# Patient Record
Sex: Female | Born: 1955
Health system: Southern US, Community
[De-identification: ages and names within clinical notes are randomized; demographics above are authoritative.]

## PROBLEM LIST (undated history)

## (undated) DIAGNOSIS — M1990 Unspecified osteoarthritis, unspecified site: Secondary | ICD-10-CM

## (undated) DIAGNOSIS — I10 Essential (primary) hypertension: Secondary | ICD-10-CM

## (undated) DIAGNOSIS — IMO0001 Reserved for inherently not codable concepts without codable children: Secondary | ICD-10-CM

## (undated) DIAGNOSIS — K219 Gastro-esophageal reflux disease without esophagitis: Secondary | ICD-10-CM

## (undated) DIAGNOSIS — I776 Arteritis, unspecified: Secondary | ICD-10-CM

## (undated) HISTORY — PX: TUBAL LIGATION: SHX77

## (undated) HISTORY — PX: COLONOSCOPY: SHX174

---

## 2002-05-20 ENCOUNTER — Other Ambulatory Visit: Admission: RE | Admit: 2002-05-20 | Discharge: 2002-05-20 | Payer: Self-pay | Admitting: Obstetrics and Gynecology

## 2003-04-24 ENCOUNTER — Emergency Department (HOSPITAL_COMMUNITY): Admission: EM | Admit: 2003-04-24 | Discharge: 2003-04-24 | Payer: Self-pay | Admitting: Emergency Medicine

## 2003-11-13 ENCOUNTER — Emergency Department (HOSPITAL_COMMUNITY): Admission: EM | Admit: 2003-11-13 | Discharge: 2003-11-13 | Payer: Self-pay | Admitting: Emergency Medicine

## 2003-12-08 ENCOUNTER — Ambulatory Visit (HOSPITAL_COMMUNITY): Admission: RE | Admit: 2003-12-08 | Discharge: 2003-12-08 | Payer: Self-pay | Admitting: Internal Medicine

## 2005-04-05 ENCOUNTER — Inpatient Hospital Stay (HOSPITAL_COMMUNITY): Admission: RE | Admit: 2005-04-05 | Discharge: 2005-04-06 | Payer: Self-pay | Admitting: Obstetrics & Gynecology

## 2007-11-17 ENCOUNTER — Emergency Department (HOSPITAL_COMMUNITY): Admission: EM | Admit: 2007-11-17 | Discharge: 2007-11-17 | Payer: Self-pay | Admitting: Emergency Medicine

## 2008-05-15 ENCOUNTER — Emergency Department (HOSPITAL_COMMUNITY): Admission: EM | Admit: 2008-05-15 | Discharge: 2008-05-15 | Payer: Self-pay | Admitting: Emergency Medicine

## 2010-02-01 ENCOUNTER — Emergency Department (HOSPITAL_COMMUNITY): Admission: EM | Admit: 2010-02-01 | Discharge: 2010-02-01 | Payer: Self-pay | Admitting: Emergency Medicine

## 2010-05-22 ENCOUNTER — Emergency Department (HOSPITAL_COMMUNITY): Admission: EM | Admit: 2010-05-22 | Discharge: 2010-05-22 | Payer: Self-pay | Admitting: Emergency Medicine

## 2010-11-01 LAB — COMPREHENSIVE METABOLIC PANEL
AST: 31 U/L (ref 0–37)
Albumin: 3.8 g/dL (ref 3.5–5.2)
Calcium: 9.2 mg/dL (ref 8.4–10.5)
Creatinine, Ser: 1.11 mg/dL (ref 0.4–1.2)
GFR calc Af Amer: >60 mL/min (ref >60)

## 2010-11-01 LAB — CBC
MCH: 30.7 pg (ref 26.0–34.0)
MCHC: 34 g/dL (ref 30.0–36.0)
Platelets: 223 10*3/uL (ref 150–400)
RBC: 4.17 MIL/uL (ref 3.87–5.11)

## 2010-11-01 LAB — DIFFERENTIAL
Eosinophils Relative: 1 % (ref 0–5)
Lymphocytes Relative: 36 % (ref 12–46)
Lymphs Abs: 3.4 10*3/uL (ref 0.7–4.0)
Monocytes Absolute: 0.8 10*3/uL (ref 0.1–1.0)
Monocytes Relative: 8 % (ref 3–12)

## 2011-01-05 NOTE — Op Note (Signed)
Jane, [name] NO.:  [id number]   MEDICAL RECORD NO.:  [id number]          PATIENT TYPE:  INP   LOCATION:  9313                          FACILITY:  [facility]   PHYSICIAN:  Roseanna Rainbow, M.D.DATE OF BIRTH:  [birth year]   DATE OF PROCEDURE:  04/05/2005  DATE OF DISCHARGE:                                 OPERATIVE REPORT   PREOPERATIVE DIAGNOSIS:  Perineal body relaxation.   POSTOPERATIVE DIAGNOSES:  1.  Perineal body relaxation.  2.  Low rectocele.   PROCEDURE:  Posterior colporrhaphy, perineoplasty.   SURGEONS:  Roseanna Rainbow, M.D., Bing Neighbors. Clearance Coots, M.D.   ANESTHESIA:  General endotracheal.   COMPLICATIONS:  None.   ESTIMATED BLOOD LOSS:  Minimal.   PROCEDURES:  The patient was taken to the operating room with an IV running.  The patient was placed in the dorsal lithotomy position, prepped and draped  in the usual sterile fashion.  Allis clamps were applied to the posterior  vaginal mucosa.  An additional Allis clamp was placed in the midline at the  top of the rectocele.  A transverse incision was made in the posterior  fourchette.  The posterior vaginal cuff was then dissected off the  perirectal fascia.  An additional incision was made in the perineal body,  removing a triangular piece of skin.  A vertical skin was made in the  posterior vaginal mucosa.  The pararectal fascia was dissected off the  posterior vaginal mucosa.  The Denonvilliers' fascia was then plicated using  vertical mattress sutures.  The perineal body was then closed with  interrupted sutures and sutures were placed to reconstruct the perineal  body.  The posterior vaginal wall was then closed in a running fashion.  The  hymenal ring was reconstructed.  The subcutaneous tissue was reapproximated.  The perineal skin was reapproximated in a subcuticular fashion.  The vagina  was then irrigated, as well as the perineum.  A vaginal pack was then placed  as well as a  Foley catheter.      Roseanna Rainbow, M.D.  Electronically Signed     LAJ/MEDQ  D:  04/05/2005  T:  04/05/2005  Job:  [id number]

## 2011-01-05 NOTE — Discharge Summary (Signed)
NAMELENELL, MCCONNELL NO.:  [id number]   MEDICAL RECORD NO.:  [id number]          PATIENT TYPE:  INP   LOCATION:  9313                          FACILITY:  [facility]   PHYSICIAN:  Roseanna Rainbow, M.D.DATE OF BIRTH:  04/04/1956   DATE OF ADMISSION:  04/05/2005  DATE OF DISCHARGE:  04/06/2005                                 DISCHARGE SUMMARY   CHIEF COMPLAINT:  The patient is a 55 year old para 4 with perineal  relaxation who presents for perineorrhaphy, possible posterior colporrhaphy.   ALLERGIES:  No known drug allergies.   MEDICATIONS:  Lisinopril, hydrochlorothiazide, Prevacid, lorazepam.   PAST MEDICAL HISTORY:  1.  GERD.  2.  Hypertension.  3.  Anxiety disorder.   PAST SURGICAL HISTORY:  Bilateral tubal ligation.   PAST OB/GYN HISTORY:  She is status post four spontaneous vaginal  deliveries.  Please see the above.   SOCIAL HISTORY:  No tobacco, ethanol, or drug use.   FAMILY HISTORY:  Uterine cancer and diabetes mellitus.   PHYSICAL EXAMINATION:  VITAL SIGNS:  Temperature 97.8, pulse 78,  respirations 20, blood pressure 166/83.  GENERAL APPEARANCE:  No apparent distress.  HEENT:  Normocephalic, atraumatic.  NECK:  Supple.  LUNGS:  Clear to auscultation bilaterally.  HEART:  Regular rate and rhythm.  ABDOMEN:  No organomegaly.  PELVIC:  External female genitalia.  Remarkable for lax perineal body.  Vagina:  No rectocele or cystocele.  Cervix without lesions.  Bimanual  examination:  The uterus is small, anteverted, nontender, well supported.  Adnexa nonpalpable, nontender.  Rectovaginal examination confirmatory.  EXTREMITIES:  No clubbing, cyanosis, edema.  SKIN:  Without rash.   ASSESSMENT:  Perineal body relaxation.   PLAN:  Perineorrhaphy, possible posterior colporrhaphy.   HOSPITAL COURSE:  The patient was admitted and underwent posterior  colporrhaphy and perineorrhaphy.  Please see the dictated operative summary  for further details.   Postoperatively she did well.  She was discharged to  home on postoperative day #1, tolerating a regular diet.   DISCHARGE DIAGNOSES:  Perineal body relaxation, low rectocele.   PROCEDURE:  Posterior colporrhaphy and perineorrhaphy.   CONDITION ON DISCHARGE:  Stable.   DIET:  Regular.   ACTIVITY:  Increase activity slowly.  Pelvic rest.  Peri-Care and sitz  baths.   MEDICATIONS:  Percocet one to two tablets every six hours as needed.  May  take an over-the-counter stool softener.   DISPOSITION:  The patient was to follow up in the office in two weeks.      Roseanna Rainbow, M.D.  Electronically Signed     LAJ/MEDQ  D:  05/07/2005  T:  05/07/2005  Job:  [id number]

## 2011-05-21 LAB — COMPREHENSIVE METABOLIC PANEL
ALT: 23
AST: 30
Albumin: 3.8
CO2: 28
Calcium: 9
GFR calc Af Amer: >60
GFR calc non Af Amer: >60
Sodium: 139
Total Protein: 7.2

## 2011-05-21 LAB — DIFFERENTIAL
Eosinophils Absolute: 0.1
Eosinophils Relative: 1
Lymphs Abs: 1.8
Monocytes Absolute: 0.5
Monocytes Relative: 9

## 2011-05-21 LAB — URINALYSIS, ROUTINE W REFLEX MICROSCOPIC
Bilirubin Urine: NEGATIVE
Hgb urine dipstick: NEGATIVE
Nitrite: NEGATIVE
Specific Gravity, Urine: 1.016
pH: 8

## 2011-05-21 LAB — URINE MICROSCOPIC-ADD ON

## 2011-05-21 LAB — CBC
MCHC: 33.3
RBC: 4.28
WBC: 5.6

## 2011-07-24 ENCOUNTER — Ambulatory Visit: Attending: Obstetrics and Gynecology | Admitting: Physical Therapy

## 2011-07-24 DIAGNOSIS — M629 Disorder of muscle, unspecified: Secondary | ICD-10-CM | POA: Insufficient documentation

## 2011-07-24 DIAGNOSIS — M2420 Disorder of ligament, unspecified site: Secondary | ICD-10-CM | POA: Insufficient documentation

## 2011-07-24 DIAGNOSIS — IMO0001 Reserved for inherently not codable concepts without codable children: Secondary | ICD-10-CM | POA: Insufficient documentation

## 2011-08-23 ENCOUNTER — Encounter: Admitting: Physical Therapy

## 2011-09-07 ENCOUNTER — Ambulatory Visit: Admitting: Physical Therapy

## 2011-10-27 ENCOUNTER — Emergency Department (INDEPENDENT_AMBULATORY_CARE_PROVIDER_SITE_OTHER)
Admission: EM | Admit: 2011-10-27 | Discharge: 2011-10-27 | Disposition: A | Discharge status: Home / Self Care | Source: Home / Self Care

## 2011-10-27 ENCOUNTER — Encounter (HOSPITAL_COMMUNITY): Payer: Self-pay

## 2011-10-27 DIAGNOSIS — I10 Essential (primary) hypertension: Secondary | ICD-10-CM

## 2011-10-27 DIAGNOSIS — R51 Headache: Secondary | ICD-10-CM

## 2011-10-27 HISTORY — DX: Essential (primary) hypertension: I10

## 2011-10-27 MED ORDER — IBUPROFEN 600 MG PO TABS: 600.0000 mg | ORAL_TABLET | Freq: Three times a day (TID) | ORAL | Status: AC | PRN

## 2011-10-27 NOTE — ED Notes (Signed)
Pt has headache and sinus pressure since Monday and saw her MD on Wed and was told it was allergies and had allergy testing done on Thursday and has the results, but continues to have the pressure in her eyes and head.

## 2011-10-27 NOTE — Discharge Instructions (Signed)
Call Dr Mathews Robinsons office Monday for a follow up appt. If your headache changes or worsens before then, go to the ER for further evaluation.  Take your blood pressure medication as prescribed.

## 2011-10-27 NOTE — ED Provider Notes (Signed)
History     CSN: [id number]  Arrival date & time 10/27/11  0954   None     Chief Complaint  Patient presents with  . Headache    (Consider location/radiation/quality/duration/timing/severity/associated sxs/prior treatment) HPI Comments: Jane [name] is a 56 year old female with history of hypertension who presents today with complaints of mild intermittent headache for the last 4 days. Headache is in the frontal area and top of her head. It is tender to the touch. She was seen by her primary care physician the day of onset of symptoms and the following day. She states that she received injections for her symptoms both days, had allergy testing done and blood work. She states the office called yesterday and told her that her CBC, liver function, kidney tests, and thyroid tests were all negative. She states she has had some fatigue. Has some mild nasal congestion but no rhinorrhea. Only notices a small amount of postnasal drainage in the mornings when she first awakens. She denies cough, fever or chills. Her blood pressure is typically well-controlled. Her blood pressure was noted to be mildly elevated today but she states she did not take her blood pressure medication toda. She admits that she has had mild intermittent headaches in the past but usually associated with GERD flareups and resolves with taking over-the-counter heartburn medication. She has not tried any over-the-counter pain relievers for her headache.   Past Medical History  Diagnosis Date  . Hypertension     History reviewed. No pertinent past surgical history.  History reviewed. No pertinent family history.  History  Substance Use Topics  . Smoking status: Never Smoker   . Smokeless tobacco: Not on file  . Alcohol Use: No    OB History    Grav Para Term Preterm Abortions TAB SAB Ect Mult Living                  Review of Systems  Constitutional: Positive for fatigue. Negative for fever, chills and appetite change.    HENT: Positive for congestion and postnasal drip. Negative for ear pain, sore throat, rhinorrhea, sneezing and sinus pressure.   Respiratory: Negative for cough, shortness of breath and wheezing.   Cardiovascular: Negative for chest pain and palpitations.  Gastrointestinal: Negative for nausea and vomiting.    Allergies  Review of patient's allergies indicates no known allergies.  Home Medications   Current Outpatient Rx  Name Route Sig Dispense Refill  . BENAZEPRIL-HYDROCHLOROTHIAZIDE 20-12.5 MG PO TABS Oral Take 1 tablet by mouth daily.    . IBUPROFEN 600 MG PO TABS Oral Take 1 tablet (600 mg total) by mouth every 8 (eight) hours as needed for pain. 10 tablet 0    BP 150/85  Pulse 65  Temp(Src) 98 F (36.7 C) (Oral)  Resp 18  SpO2 100%  Physical Exam  Nursing note and vitals reviewed. Constitutional: She appears well-developed and well-nourished. No distress.  HENT:  Head: Normocephalic and atraumatic.  Right Ear: Tympanic membrane, external ear and ear canal normal.  Left Ear: Tympanic membrane, external ear and ear canal normal.  Nose: Nose normal.  Mouth/Throat: Uvula is midline, oropharynx is clear and moist and mucous membranes are normal. No oropharyngeal exudate, posterior oropharyngeal edema or posterior oropharyngeal erythema.  Eyes: Conjunctivae, EOM and lids are normal. Pupils are equal, round, and reactive to light.  Fundoscopic exam:      The right eye shows no hemorrhage and no papilledema.       The left eye shows no  hemorrhage and no papilledema.  Neck: Normal range of motion. Neck supple. No muscular tenderness present.  Cardiovascular: Normal rate, regular rhythm and normal heart sounds.   Pulmonary/Chest: Effort normal and breath sounds normal. No respiratory distress.  Lymphadenopathy:    She has no cervical adenopathy.  Neurological: She is alert. She has normal strength. No cranial nerve deficit. She displays a negative Romberg sign. Gait normal.   Reflex Scores:      Bicep reflexes are 2+ on the right side and 2+ on the left side. Skin: Skin is warm and dry.  Psychiatric: She has a normal mood and affect.    ED Course  Procedures (including critical care time)  Labs Reviewed - No data to display No results found.   1. Headache   2. Hypertension       MDM   Mild intermittent HA x 4 days. Has been seen twice by PCP with neg labs reported by pt. Exam neg. Pt has not tried any pain relievers. Rx Ibuprofen 600 mg & to f/u with PCP Monday if symptoms persist.        Melody Comas, PA 10/27/11 1354

## 2011-10-28 NOTE — ED Provider Notes (Signed)
Medical screening examination/treatment/procedure(s) were performed by non-physician practitioner and as supervising physician I was immediately available for consultation/collaboration.  Leslee Home, M.D.   Reuben Likes, MD 10/28/11 [phone]

## 2011-11-16 ENCOUNTER — Other Ambulatory Visit: Payer: Self-pay

## 2011-11-16 ENCOUNTER — Encounter (HOSPITAL_COMMUNITY): Payer: Self-pay

## 2011-11-16 ENCOUNTER — Emergency Department (INDEPENDENT_AMBULATORY_CARE_PROVIDER_SITE_OTHER)
Admission: EM | Admit: 2011-11-16 | Discharge: 2011-11-16 | Disposition: A | Discharge status: Home Health | Source: Home / Self Care | Attending: Emergency Medicine | Admitting: Emergency Medicine

## 2011-11-16 DIAGNOSIS — K219 Gastro-esophageal reflux disease without esophagitis: Secondary | ICD-10-CM

## 2011-11-16 DIAGNOSIS — R002 Palpitations: Secondary | ICD-10-CM

## 2011-11-16 MED ORDER — DEXLANSOPRAZOLE 30 MG PO CPDR: 30.0000 mg | DELAYED_RELEASE_CAPSULE | Freq: Every day | ORAL | Status: DC

## 2011-11-16 NOTE — Discharge Instructions (Signed)
Diet for GERD or PUD Nutrition therapy can help ease the discomfort of gastroesophageal reflux disease (GERD) and peptic ulcer disease (PUD).  HOME CARE INSTRUCTIONS   Eat your meals slowly, in a relaxed setting.   Eat 5 to 6 small meals per day.   If a food causes distress, stop eating it for a period of time.  FOODS TO AVOID  Coffee, regular or decaffeinated.   Cola beverages, regular or low calorie.   Tea, regular or decaffeinated.   Pepper.   Cocoa.   High fat foods, including meats.   Butter, margarine, hydrogenated oil (trans fats).   Peppermint or spearmint (if you have GERD).   Fruits and vegetables if not tolerated.   Alcohol.   Nicotine (smoking or chewing). This is one of the most potent stimulants to acid production in the gastrointestinal tract.   Any food that seems to aggravate your condition.  If you have questions regarding your diet, ask your caregiver or a registered dietitian. TIPS  Lying flat may make symptoms worse. Keep the head of your bed raised 6 to 9 inches (15 to 23 cm) by using a foam wedge or blocks under the legs of the bed.   Do not lay down until 3 hours after eating a meal.   Daily physical activity may help reduce symptoms.  MAKE SURE YOU:   Understand these instructions.   Will watch your condition.   Will get help right away if you are not doing well or get worse.  Document Released: 08/06/2005 Document Revised: 07/26/2011 Document Reviewed: 06/22/2011 [employer] Patient Information 2012 [employer]. 

## 2011-11-16 NOTE — ED Provider Notes (Signed)
Chief Complaint  Patient presents with  . Abdominal Pain    History of Present Illness:   Jane [name] is a 56 year old female who presents today for 2 issues: Indigestion, and palpitations.  She has had indigestion and heartburn for years. This has been diagnosed as reflux esophagitis. She describes heartburn, indigestion, water brash, burning in the chest, burning in the epigastrium, abdominal bloating, nausea, and some dysphagia. She denies any vomiting or evidence of GI bleeding. She had seen Dr. Loreta Ave for this in the past although no endoscopy was done. She was given DEXILANT and and this seemed to relieve the symptoms but did cause some headache. She had stopped it a couple weeks ago and that's when her symptoms flared back up again.  She also describes a one-week history of heart palpitations. She describes these as "an extra heartbeat." These are intermittent and last just a second at a time. She denies any associated chest pain, tightness, pressure, dizziness, syncope, or shortness of breath. She has had some headaches and has felt somewhat weak. She denies any cardiac history. Does not use caffeine to excess.  Review of Systems:  Other than noted above, the patient denies any of the following symptoms. Systemic:  No fever, chills, sweats, fatigue, myalgias, headache, or anorexia. Eye:  No redness, pain or drainage. ENT:  No earache, nasal congestion, rhinorrhea, sinus pressure, or sore throat. Lungs:  No cough, sputum production, wheezing, shortness of breath.  Cardiovascular:  No chest pain, palpitations, or syncope. GI:  No nausea, vomiting, abdominal pain or diarrhea. GU:  No dysuria, frequency, or hematuria. Skin:  No rash or pruritis.  PMFSH:  Past medical history, family history, social history, meds, and allergies were reviewed.  Physical Exam:   Vital signs:  BP 136/80  Pulse 69  Temp(Src) 98 F (36.7 C) (Oral)  Resp 14  SpO2 100% General:  Alert, in no distress. Eye:  PERRL,  full EOMs.  Lids and conjunctivas were normal. ENT:  TMs and canals were normal, without erythema or inflammation.  Nasal mucosa was clear and uncongested, without drainage.  Mucous membranes were moist.  Pharynx was clear, without exudate or drainage.  There were no oral ulcerations or lesions. Neck:  Supple, no adenopathy, tenderness or mass. Thyroid was normal. Lungs:  No respiratory distress.  Lungs were clear to auscultation, without wheezes, rales or rhonchi.  Breath sounds were clear and equal bilaterally. Heart:  Regular rhythm, without gallops, murmers or rubs. Abdomen:  Soft, flat, and non-tender to palpation.  No hepatosplenomagaly or mass. Skin:  Clear, warm, and dry, without rash or lesions.   Date: 11/16/2011  Rate: 62  Rhythm: normal sinus rhythm  QRS Axis: normal  Intervals: normal  ST/T Wave abnormalities: normal  Conduction Disutrbances:none  Narrative Interpretation: Normal sinus rhythm, normal EKG.  Old EKG Reviewed: unchanged  Medical Decision Making:  It sounds like she has long-standing reflux, but she's never had an endoscopy. I suggested that she followup with Dr. Loreta Ave and she may want to consider having an endoscopy done. It seems like the Dexilant was working but was causing some headaches, so maybe worthwhile trying a lesser dose of DEXILANT.  The palpitations sound like PACs or PVCs. I don't think they're anything serious, given her normal EKG and don't think she needs any followup, unless they should become bothersome or worrisome to her.   Assessment:   Diagnoses that have been ruled out:  None  Diagnoses that are still under consideration:  None  Final  diagnoses:  GERD (gastroesophageal reflux disease)  Palpitations    Plan:   1.  The following meds were prescribed:   New Prescriptions   DEXLANSOPRAZOLE (DEXILANT) 30 MG CAPSULE    Take 1 capsule (30 mg total) by mouth daily.   2.  The patient was instructed in symptomatic care and handouts were  given. 3.  The patient was told to return if becoming worse in any way, if no better in 3 or 4 days, and given some red flag symptoms that would indicate earlier return.    Reuben Likes, MD 11/16/11 2138

## 2011-11-16 NOTE — ED Notes (Signed)
Pt c/o abdominal pain onset for past couple weeks. Pt using OTC meds with no relief.  Pt states pain is epigastric, she feels bloated.  Pt denies N/V/D.

## 2012-09-17 ENCOUNTER — Other Ambulatory Visit: Payer: Self-pay

## 2012-09-17 ENCOUNTER — Encounter (HOSPITAL_COMMUNITY): Payer: Self-pay | Admitting: Emergency Medicine

## 2012-09-17 ENCOUNTER — Emergency Department (INDEPENDENT_AMBULATORY_CARE_PROVIDER_SITE_OTHER)

## 2012-09-17 ENCOUNTER — Emergency Department (INDEPENDENT_AMBULATORY_CARE_PROVIDER_SITE_OTHER)
Admission: EM | Admit: 2012-09-17 | Discharge: 2012-09-17 | Disposition: A | Discharge status: Home / Self Care | Source: Home / Self Care

## 2012-09-17 DIAGNOSIS — R0609 Other forms of dyspnea: Secondary | ICD-10-CM

## 2012-09-17 DIAGNOSIS — R0989 Other specified symptoms and signs involving the circulatory and respiratory systems: Secondary | ICD-10-CM

## 2012-09-17 DIAGNOSIS — K219 Gastro-esophageal reflux disease without esophagitis: Secondary | ICD-10-CM

## 2012-09-17 HISTORY — DX: Gastro-esophageal reflux disease without esophagitis: K21.9

## 2012-09-17 HISTORY — DX: Reserved for inherently not codable concepts without codable children: IMO0001

## 2012-09-17 NOTE — ED Provider Notes (Signed)
Medical screening examination/treatment/procedure(s) were performed by non-physician practitioner and as supervising physician I was immediately available for consultation/collaboration.  Fredrick Geoghegan   Valary Manahan, MD 09/17/12 1540 

## 2012-09-17 NOTE — ED Notes (Signed)
Patient returned from xray.

## 2012-09-17 NOTE — ED Notes (Signed)
Reports "dont feel good" chest soreness.  Generalized not feeling her usual self, just feeling bad in general.

## 2012-09-17 NOTE — ED Provider Notes (Signed)
History     CSN: [id number]  Arrival date & time 09/17/12  1247   None     Chief Complaint  Patient presents with  . Weakness    (Consider location/radiation/quality/duration/timing/severity/associated sxs/prior treatment) HPI Comments: 57 year old female who presents with epigastric discomfort, nausea, heartburn, belching and bloating. She has a history of GERD and has been managed with Dexalant. She did stop taking it for a while because it was "too strong" due to side effects. She also has Prevacid and she is just restarted that. She states the symptoms are similar to what she has had in the past relating to GERD. She denies having shortness of breath at rest or any form of chest discomfort. 3 days ago while carrying groceries up the stairs she became short of breath. Since that time exertion such as housework causes her to be short of breath and fatigue. No chest pain or discomfort.  While sitting on the table she appears quite comfortable, talkative and jovial. No evidence of respiratory distress and no chest discomfort.   Past Medical History  Diagnosis Date  . Hypertension   . Reflux     Past Surgical History  Procedure Date  . Tubal ligation   . Colonoscopy     No family history on file.  History  Substance Use Topics  . Smoking status: Never Smoker   . Smokeless tobacco: Not on file  . Alcohol Use: No    OB History    Grav Para Term Preterm Abortions TAB SAB Ect Mult Living                  Review of Systems  Constitutional: Negative for fever, diaphoresis, activity change, appetite change and fatigue.  HENT: Negative.   Eyes: Negative.   Respiratory: Negative for apnea, cough, choking, chest tightness, wheezing and stridor.        As per history of present illness  Cardiovascular: Negative for palpitations and leg swelling.  Gastrointestinal: Negative for blood in stool.       See history of present illness  Genitourinary: Negative.     Musculoskeletal: Negative.   Skin: Negative.   Neurological: Negative.   Psychiatric/Behavioral: Negative.     Allergies  Review of patient's allergies indicates no known allergies.  Home Medications   Current Outpatient Rx  Name  Route  Sig  Dispense  Refill  . LANSOPRAZOLE 15 MG PO CPDR   Oral   Take 15 mg by mouth daily.         Marland Kitchen BENAZEPRIL-HYDROCHLOROTHIAZIDE 20-12.5 MG PO TABS   Oral   Take 1 tablet by mouth daily.         . DEXLANSOPRAZOLE 30 MG PO CPDR   Oral   Take 1 capsule (30 mg total) by mouth daily.   30 capsule   0     BP 143/84  Pulse 81  Temp 97.9 F (36.6 C) (Oral)  Resp 20  Ht 5\' 5"  (1.651 m)  Wt 222 lb (100.699 kg)  BMI 36.94 kg/m2  SpO2 100%  Physical Exam  Nursing note and vitals reviewed. Constitutional: She is oriented to person, place, and time. She appears well-developed and well-nourished.  HENT:  Nose: Nose normal.  Mouth/Throat: Oropharynx is clear and moist. No oropharyngeal exudate.  Eyes: Conjunctivae normal and EOM are normal. Pupils are equal, round, and reactive to light.  Neck: Normal range of motion. Neck supple.  Cardiovascular: Normal rate, regular rhythm and normal heart sounds.   Pulmonary/Chest:  Effort normal and breath sounds normal. No respiratory distress. She has no wheezes. She has no rales.  Abdominal: Soft. She exhibits no distension and no mass. There is tenderness. There is no rebound and no guarding.       Minor tenderness in the epigastrium only.  Musculoskeletal: Normal range of motion. She exhibits no edema and no tenderness.  Lymphadenopathy:    She has no cervical adenopathy.  Neurological: She is alert and oriented to person, place, and time. She exhibits normal muscle tone.  Skin: Skin is warm and dry.  Psychiatric: She has a normal mood and affect.    ED Course  Procedures (including critical care time)  Labs Reviewed - No data to display Dg Chest 2 View  09/17/2012  *RADIOLOGY REPORT*   Clinical Data: Acute exertional dyspnea  CHEST - 2 VIEW  Comparison: 11/13/2003  Findings: Lungs are clear. No pleural effusion or pneumothorax.  Cardiomediastinal silhouette is within normal limits.  Mild degenerative changes of the visualized thoracolumbar spine.  IMPRESSION: No evidence of acute cardiopulmonary disease.   Original Report Authenticated By: Charline Bills, M.D.      1. GERD (gastroesophageal reflux disease)   2. Exertional dyspnea       MDM  Patient has been asymptomatic while in the urgent care. She is to start taking her Prevacid on a daily basis. Decrease your spicy and greasy food intake. He small amounts at a time. Call your primary care doctor, Dr. Renae Gloss for an appointment to have further evaluation of exertional dyspnea. The placenta becomes worse or develop dyspnea at rest or any kind of chest pain or additional weakness or fatigue were need to call EMS or go to the emergency department. Today the x-ray is clear and EKG is within normal limits. The patient is asymptomatic and stable.          Hayden Rasmussen, NP 09/17/12 [phone]

## 2012-09-17 NOTE — ED Notes (Signed)
Triage delay secondary to department transfer 

## 2012-09-17 NOTE — ED Notes (Signed)
Onalee Hua, pa at bedside

## 2012-09-17 NOTE — ED Notes (Signed)
Discharge delay secondary to emergency in lobby, patient states understanding

## 2012-12-15 ENCOUNTER — Encounter: Payer: Self-pay | Admitting: Obstetrics & Gynecology

## 2013-01-24 ENCOUNTER — Emergency Department (HOSPITAL_COMMUNITY)
Admission: EM | Admit: 2013-01-24 | Discharge: 2013-01-25 | Disposition: A | Discharge status: Home / Self Care | Attending: Emergency Medicine | Admitting: Emergency Medicine

## 2013-01-24 DIAGNOSIS — R197 Diarrhea, unspecified: Secondary | ICD-10-CM | POA: Insufficient documentation

## 2013-01-24 DIAGNOSIS — R112 Nausea with vomiting, unspecified: Secondary | ICD-10-CM

## 2013-01-24 DIAGNOSIS — Z9889 Other specified postprocedural states: Secondary | ICD-10-CM | POA: Insufficient documentation

## 2013-01-24 DIAGNOSIS — Z79899 Other long term (current) drug therapy: Secondary | ICD-10-CM | POA: Insufficient documentation

## 2013-01-24 DIAGNOSIS — K219 Gastro-esophageal reflux disease without esophagitis: Secondary | ICD-10-CM | POA: Insufficient documentation

## 2013-01-24 DIAGNOSIS — Z9851 Tubal ligation status: Secondary | ICD-10-CM | POA: Insufficient documentation

## 2013-01-24 DIAGNOSIS — I10 Essential (primary) hypertension: Secondary | ICD-10-CM | POA: Insufficient documentation

## 2013-01-24 DIAGNOSIS — Z3202 Encounter for pregnancy test, result negative: Secondary | ICD-10-CM | POA: Insufficient documentation

## 2013-01-24 LAB — URINALYSIS, ROUTINE W REFLEX MICROSCOPIC
Glucose, UA: NEGATIVE mg/dL
Ketones, ur: NEGATIVE mg/dL
Nitrite: NEGATIVE
Protein, ur: 30 mg/dL — AB
pH: 5.5 (ref 5.0–8.0)

## 2013-01-24 LAB — LIPASE, BLOOD: Lipase: 23 U/L (ref 11–59)

## 2013-01-24 LAB — CBC WITH DIFFERENTIAL/PLATELET
Basophils Absolute: 0 10*3/uL (ref 0.0–0.1)
Basophils Relative: 0 % (ref 0–1)
Eosinophils Relative: 1 % (ref 0–5)
HCT: 43.1 % (ref 36.0–46.0)
MCH: 30.7 pg (ref 26.0–34.0)
MCHC: 34.1 g/dL (ref 30.0–36.0)
MCV: 90 fL (ref 78.0–100.0)
Monocytes Absolute: 0.7 10*3/uL (ref 0.1–1.0)
Monocytes Relative: 6 % (ref 3–12)
RDW: 13.4 % (ref 11.5–15.5)

## 2013-01-24 LAB — URINE MICROSCOPIC-ADD ON

## 2013-01-24 LAB — COMPREHENSIVE METABOLIC PANEL
AST: 29 U/L (ref 0–37)
Albumin: 4 g/dL (ref 3.5–5.2)
BUN: 9 mg/dL (ref 6–23)
Calcium: 9.7 mg/dL (ref 8.4–10.5)
Creatinine, Ser: 0.74 mg/dL (ref 0.50–1.10)
GFR calc non Af Amer: >90 mL/min (ref >90)

## 2013-01-24 LAB — POCT PREGNANCY, URINE: Preg Test, Ur: NEGATIVE

## 2013-01-24 MED ORDER — ONDANSETRON HCL 4 MG/2ML IJ SOLN
4.0000 mg | Freq: Once | INTRAMUSCULAR | Status: AC
  Administered 2013-01-24: 4 mg via INTRAVENOUS
  Filled 2013-01-24: qty 2

## 2013-01-24 MED ORDER — SODIUM CHLORIDE 0.9 % IV BOLUS (SEPSIS)
1000.0000 mL | Freq: Once | INTRAVENOUS | Status: AC
  Administered 2013-01-24: 1000 mL via INTRAVENOUS

## 2013-01-24 MED ORDER — ONDANSETRON HCL 4 MG PO TABS: 4.0000 mg | ORAL_TABLET | Freq: Three times a day (TID) | ORAL | Status: DC | PRN

## 2013-01-24 NOTE — ED Provider Notes (Signed)
History     CSN: [id number]  Arrival date & time 01/24/13  2024   First MD Initiated Contact with Patient 01/24/13 2152      Chief Complaint  Patient presents with  . Abdominal Pain    (Consider location/radiation/quality/duration/timing/severity/associated sxs/prior treatment) HPI Comments: Patient presents emergency department with chief complaint of nausea, vomiting, and diarrhea. Patient states that the nausea and vomiting began last night. She states that today she has had 2 episodes of loose stool. She states that it is associated with crampy upper abdominal pain, which she believes is due to the vomiting. She denies fever, chills, or blood in her vomit or stool. She has not tried taking anything to alleviate her symptoms. She has a history of GERD, for which she takes Prevacid with good relief. She states that she feels tired and worn out. She has not in acute distress. She states that the abdominal pain is mild.  The history is provided by the patient. No language interpreter was used.    Past Medical History  Diagnosis Date  . Hypertension   . Reflux     Past Surgical History  Procedure Laterality Date  . Tubal ligation    . Colonoscopy      No family history on file.  History  Substance Use Topics  . Smoking status: Never Smoker   . Smokeless tobacco: Not on file  . Alcohol Use: No    OB History   Grav Para Term Preterm Abortions TAB SAB Ect Mult Living                  Review of Systems  All other systems reviewed and are negative.    Allergies  Review of patient's allergies indicates no known allergies.  Home Medications   Current Outpatient Rx  Name  Route  Sig  Dispense  Refill  . hydrochlorothiazide (HYDRODIURIL) 25 MG tablet   Oral   Take 25 mg by mouth daily.         . lansoprazole (PREVACID) 15 MG capsule   Oral   Take 15 mg by mouth daily.           BP 152/91  Pulse 88  Temp(Src) 98 F (36.7 C) (Oral)  Resp 20  SpO2  97%  Physical Exam  Nursing note and vitals reviewed. Constitutional: She is oriented to person, place, and time. She appears well-developed and well-nourished.  HENT:  Head: Normocephalic and atraumatic.  Eyes: Conjunctivae and EOM are normal. Pupils are equal, round, and reactive to light.  Neck: Normal range of motion. Neck supple.  Cardiovascular: Normal rate and regular rhythm.  Exam reveals no gallop and no friction rub.   No murmur heard. Pulmonary/Chest: Effort normal and breath sounds normal. No respiratory distress. She has no wheezes. She has no rales. She exhibits no tenderness.  Abdominal: Soft. Bowel sounds are normal. She exhibits no distension and no mass. There is no tenderness. There is no rebound and no guarding.  No focal abdominal tenderness, no right lower cord tenderness or pain at McBurney's point, no right upper quadrant tenderness or Murphy's sign, no left lower quadrant tenderness, no fluid wave, or signs of peritonitis  Musculoskeletal: Normal range of motion. She exhibits no edema and no tenderness.  Neurological: She is alert and oriented to person, place, and time.  Skin: Skin is warm and dry.  Psychiatric: She has a normal mood and affect. Her behavior is normal. Judgment and thought content normal.  ED Course  Procedures (including critical care time)  Labs Reviewed  CBC WITH DIFFERENTIAL - Abnormal; Notable for the following:    WBC 11.0 (*)    All other components within normal limits  COMPREHENSIVE METABOLIC PANEL - Abnormal; Notable for the following:    Potassium 3.4 (*)    Glucose, Bld 106 (*)    All other components within normal limits  URINALYSIS, ROUTINE W REFLEX MICROSCOPIC - Abnormal; Notable for the following:    APPearance TURBID (*)    Protein, ur 30 (*)    Leukocytes, UA SMALL (*)    All other components within normal limits  URINE MICROSCOPIC-ADD ON - Abnormal; Notable for the following:    Casts HYALINE CASTS (*)    All other  components within normal limits  LIPASE, BLOOD  POCT PREGNANCY, URINE   Results for orders placed during the hospital encounter of 01/24/13  CBC WITH DIFFERENTIAL      Result Value Range   WBC 11.0 (*) 4.0 - 10.5 K/uL   RBC 4.79  3.87 - 5.11 MIL/uL   Hemoglobin 14.7  12.0 - 15.0 g/dL   HCT 16.1  09.6 - 04.5 %   MCV 90.0  78.0 - 100.0 fL   MCH 30.7  26.0 - 34.0 pg   MCHC 34.1  30.0 - 36.0 g/dL   RDW 40.9  81.1 - 91.4 %   Platelets 206  150 - 400 K/uL   Neutrophils Relative % 63  43 - 77 %   Neutro Abs 6.9  1.7 - 7.7 K/uL   Lymphocytes Relative 30  12 - 46 %   Lymphs Abs 3.3  0.7 - 4.0 K/uL   Monocytes Relative 6  3 - 12 %   Monocytes Absolute 0.7  0.1 - 1.0 K/uL   Eosinophils Relative 1  0 - 5 %   Eosinophils Absolute 0.1  0.0 - 0.7 K/uL   Basophils Relative 0  0 - 1 %   Basophils Absolute 0.0  0.0 - 0.1 K/uL  COMPREHENSIVE METABOLIC PANEL      Result Value Range   Sodium 139  135 - 145 mEq/L   Potassium 3.4 (*) 3.5 - 5.1 mEq/L   Chloride 102  96 - 112 mEq/L   CO2 30  19 - 32 mEq/L   Glucose, Bld 106 (*) 70 - 99 mg/dL   BUN 9  6 - 23 mg/dL   Creatinine, Ser 7.82  0.50 - 1.10 mg/dL   Calcium 9.7  8.4 - 95.6 mg/dL   Total Protein 7.9  6.0 - 8.3 g/dL   Albumin 4.0  3.5 - 5.2 g/dL   AST 29  0 - 37 U/L   ALT 24  0 - 35 U/L   Alkaline Phosphatase 104  39 - 117 U/L   Total Bilirubin 0.6  0.3 - 1.2 mg/dL   GFR calc non Af Amer >90  >90 mL/min   GFR calc Af Amer >90  >90 mL/min  LIPASE, BLOOD      Result Value Range   Lipase 23  11 - 59 U/L  URINALYSIS, ROUTINE W REFLEX MICROSCOPIC      Result Value Range   Color, Urine YELLOW  YELLOW   APPearance TURBID (*) CLEAR   Specific Gravity, Urine 1.020  1.005 - 1.030   pH 5.5  5.0 - 8.0   Glucose, UA NEGATIVE  NEGATIVE mg/dL   Hgb urine dipstick NEGATIVE  NEGATIVE   Bilirubin Urine NEGATIVE  NEGATIVE   Ketones, ur NEGATIVE  NEGATIVE mg/dL   Protein, ur 30 (*) NEGATIVE mg/dL   Urobilinogen, UA 0.2  0.0 - 1.0 mg/dL   Nitrite  NEGATIVE  NEGATIVE   Leukocytes, UA SMALL (*) NEGATIVE  URINE MICROSCOPIC-ADD ON      Result Value Range   Squamous Epithelial / LPF RARE  RARE   WBC, UA 0-2  <3 WBC/hpf   Bacteria, UA RARE  RARE   Casts HYALINE CASTS (*) NEGATIVE   Urine-Other MUCOUS PRESENT    POCT PREGNANCY, URINE      Result Value Range   Preg Test, Ur NEGATIVE  NEGATIVE   No results found.    1. Nausea vomiting and diarrhea       MDM  Patient with nausea, vomiting, diarrhea. Suspect viral gastroenteritis. Abdomen is nonacute, no focal tenderness. She is otherwise healthy. She appears well. Will give a liter of fluid. Zofran, and fluid challenge.  12:58 AM Patient is feeling better after fluids.  She is tolerating PO.  Suspect viral gastroenteritis.  Labs are largely unremarkable.  Will discharge to home with PCP follow-up.  Patient understands and agrees with the plan.  Filed Vitals:   01/24/13 2345  BP: 136/80  Pulse: 69  Temp:   Resp:           Roxy Horseman, PA-C 01/25/13 [phone]

## 2013-01-24 NOTE — ED Notes (Signed)
Pt c/o of upper abdominal pain that feels like cramping, with N/V, dizziness, light headedness.

## 2013-01-25 NOTE — ED Provider Notes (Signed)
  Medical screening examination/treatment/procedure(s) were performed by non-physician practitioner and as supervising physician I was immediately available for consultation/collaboration.    Vida Roller, MD 01/25/13 2017

## 2013-03-23 ENCOUNTER — Ambulatory Visit: Payer: Self-pay | Admitting: Obstetrics & Gynecology

## 2013-05-12 ENCOUNTER — Encounter (HOSPITAL_COMMUNITY): Payer: Self-pay | Admitting: Emergency Medicine

## 2013-05-12 ENCOUNTER — Emergency Department (HOSPITAL_COMMUNITY)
Admission: EM | Admit: 2013-05-12 | Discharge: 2013-05-12 | Disposition: A | Discharge status: Home / Self Care | Attending: Emergency Medicine | Admitting: Emergency Medicine

## 2013-05-12 DIAGNOSIS — X58XXXA Exposure to other specified factors, initial encounter: Secondary | ICD-10-CM | POA: Insufficient documentation

## 2013-05-12 DIAGNOSIS — Y929 Unspecified place or not applicable: Secondary | ICD-10-CM | POA: Insufficient documentation

## 2013-05-12 DIAGNOSIS — Z862 Personal history of diseases of the blood and blood-forming organs and certain disorders involving the immune mechanism: Secondary | ICD-10-CM | POA: Insufficient documentation

## 2013-05-12 DIAGNOSIS — Z8639 Personal history of other endocrine, nutritional and metabolic disease: Secondary | ICD-10-CM | POA: Insufficient documentation

## 2013-05-12 DIAGNOSIS — Y9389 Activity, other specified: Secondary | ICD-10-CM | POA: Insufficient documentation

## 2013-05-12 DIAGNOSIS — S76219A Strain of adductor muscle, fascia and tendon of unspecified thigh, initial encounter: Secondary | ICD-10-CM

## 2013-05-12 DIAGNOSIS — Z79899 Other long term (current) drug therapy: Secondary | ICD-10-CM | POA: Insufficient documentation

## 2013-05-12 DIAGNOSIS — S8990XA Unspecified injury of unspecified lower leg, initial encounter: Secondary | ICD-10-CM | POA: Insufficient documentation

## 2013-05-12 DIAGNOSIS — Y990 Civilian activity done for income or pay: Secondary | ICD-10-CM | POA: Insufficient documentation

## 2013-05-12 DIAGNOSIS — M79609 Pain in unspecified limb: Secondary | ICD-10-CM

## 2013-05-12 DIAGNOSIS — Z791 Long term (current) use of non-steroidal anti-inflammatories (NSAID): Secondary | ICD-10-CM | POA: Insufficient documentation

## 2013-05-12 DIAGNOSIS — I10 Essential (primary) hypertension: Secondary | ICD-10-CM | POA: Insufficient documentation

## 2013-05-12 DIAGNOSIS — IMO0002 Reserved for concepts with insufficient information to code with codable children: Secondary | ICD-10-CM | POA: Insufficient documentation

## 2013-05-12 DIAGNOSIS — K219 Gastro-esophageal reflux disease without esophagitis: Secondary | ICD-10-CM | POA: Insufficient documentation

## 2013-05-12 MED ORDER — MELOXICAM 15 MG PO TABS: 15.0000 mg | ORAL_TABLET | Freq: Every day | ORAL | Status: DC

## 2013-05-12 MED ORDER — TRAMADOL HCL 50 MG PO TABS: 50.0000 mg | ORAL_TABLET | Freq: Four times a day (QID) | ORAL | Status: DC | PRN

## 2013-05-12 NOTE — Progress Notes (Signed)
*  Preliminary Results* Left lower extremity venous duplex completed. Left lower extremity is negative for deep vein thrombosis. There is no evidence of left Baker's cyst.  Preliminary results discussed with Antony Madura, PA.  05/12/2013 12:19 PM  Gertie Fey, RVT, RDCS, RDMS

## 2013-05-12 NOTE — ED Notes (Signed)
[place]. [place] to [place]

## 2013-05-12 NOTE — ED Provider Notes (Signed)
CSN: [id number]     Arrival date & time 05/12/13  1037 History  This chart was scribed for Antony Madura, PA, working with Toy Baker, MD by Blanchard Kelch, ED Scribe. This patient was seen in room TR06C/TR06C and the patient's care was started at 11:10 AM.    Chief Complaint  Patient presents with  . Leg Pain  . Hip Pain    Patient is a 57 y.o. female presenting with leg pain and hip pain. The history is provided by the patient. No language interpreter was used.  Leg Pain Location:  Leg Time since incident:  1 week Injury: no   Leg location:  L upper leg Pain details:    Quality:  Sharp   Duration:  1 week   Timing:  Intermittent Worsened by:  Activity Hip Pain Pertinent negatives include no shortness of breath.    HPI Comments: Jane [name] is a 57 y.o. female who presents to the Emergency Department complaining of intermittent left upper medial thigh pain that began about a week ago. It has worsened in frequency in the last two days. She describes the pain as stabbing and sharp. The pain is worsened with prolonged movement or walking. She denies left calf tenderness, leg swelling, or shortness of breath. She denies any recent surgery or hospitalization. She denies HRT and long distance traveling. Patient states she injured her back three weeks ago at work while picking up a vacuum. She has been taking pain medication and working on strengthening exercises with her PCP at [facility] since then. She has a past medical history of hypertension and high cholesterol.   Past Medical History  Diagnosis Date  . Hypertension   . Reflux    Past Surgical History  Procedure Laterality Date  . Tubal ligation    . Colonoscopy     History reviewed. No pertinent family history. History  Substance Use Topics  . Smoking status: Never Smoker   . Smokeless tobacco: Not on file  . Alcohol Use: No   OB History   Grav Para Term Preterm Abortions TAB SAB Ect Mult Living        Review of Systems  Respiratory: Negative for shortness of breath.   Cardiovascular: Negative for leg swelling.  Musculoskeletal: Positive for myalgias.  Neurological: Negative for numbness.  All other systems reviewed and are negative.   Allergies  Review of patient's allergies indicates no known allergies.  Home Medications   Current Outpatient Rx  Name  Route  Sig  Dispense  Refill  . diclofenac sodium (VOLTAREN) 1 % GEL   Topical   Apply 2 g topically 2 (two) times daily as needed (pain).         . hydrochlorothiazide (HYDRODIURIL) 25 MG tablet   Oral   Take 25 mg by mouth daily.         . lansoprazole (PREVACID) 15 MG capsule   Oral   Take 15 mg by mouth daily.         . meloxicam (MOBIC) 15 MG tablet   Oral   Take 1 tablet (15 mg total) by mouth daily.   15 tablet   0   . traMADol (ULTRAM) 50 MG tablet   Oral   Take 1 tablet (50 mg total) by mouth every 6 (six) hours as needed for pain.   11 tablet   0    Triage Vitals: BP 147/79  Pulse 64  Temp(Src) 98.4 F (36.9 C) (Oral)  Resp  18  SpO2 97%  Physical Exam  Nursing note and vitals reviewed. Constitutional: She is oriented to person, place, and time. She appears well-developed and well-nourished. No distress.  HENT:  Head: Normocephalic and atraumatic.  Eyes: Conjunctivae and EOM are normal. No scleral icterus.  Neck: Normal range of motion.  Cardiovascular: Normal rate, regular rhythm and intact distal pulses.   Pulses:      Dorsalis pedis pulses are 2+ on the right side, and 2+ on the left side.       Posterior tibial pulses are 2+ on the right side, and 2+ on the left side.  DP and PT pulses 2+ b/l  Pulmonary/Chest: Effort normal. No respiratory distress.  Musculoskeletal: Normal range of motion.  No TTP of L hip joint. Normal ROM of L hip with flexion, internal rotation, and external rotation. No calf tenderness bilaterally. Normal strength against resistance in lower extremities. No  swellling or pitting edema.   Neurological: She is alert and oriented to person, place, and time.  Abulatory with normal gait. No sensory or motor deficits appreciated.  Skin: Skin is warm and dry. No rash noted. She is not diaphoretic. No erythema. No pallor.  Psychiatric: She has a normal mood and affect. Her behavior is normal.    ED Course  Procedures (including critical care time)  DIAGNOSTIC STUDIES: Oxygen Saturation is 97% on room air, adequate by my interpretation.    COORDINATION OF CARE:  11:18 AM -Will order left leg ultrasound. Patient verbalizes understanding and agrees with treatment plan.  Labs Review Labs Reviewed - No data to display  Imaging Review *Preliminary Results*  Left lower extremity venous duplex completed.  Left lower extremity is negative for deep vein thrombosis. There is no evidence of left Baker's cyst.  Preliminary results discussed with Antony Madura, PA.  05/12/2013 12:19 PM  Gertie Fey, RVT, RDCS, RDMS    MDM   1. Groin strain, initial encounter    57 year old female who presents for left groin pain. Patient neurovascularly intact and ambulatory. No sensory or motor deficits appreciated. No concerning signs for DVT; however, patient concerned about a blood clot. Venous duplex of left lower extremity without evidence of DVT. Symptoms most consistent with groin strain. Have recommended rest, ice, and Mobic for symptoms. Return precautions provided and patient agreeable to plan with no unaddressed concerns.   I personally performed the services described in this documentation, which was scribed in my presence. The recorded information has been reviewed and is accurate.     Antony Madura, PA-C 05/12/13 1616

## 2013-05-12 NOTE — ED Notes (Signed)
Pt states she is currently out of work on [employer] comp for back injury and presents today with c/o L hip/leg pain.  Pt denies injury or trauma.

## 2013-05-12 NOTE — ED Notes (Signed)
Pt c/o left upper leg pain that is into hip; pt sts recent issues with lower back and sts pain radiating to hip and upper leg worse with movement

## 2013-05-15 NOTE — ED Provider Notes (Signed)
Medical screening examination/treatment/procedure(s) were performed by non-physician practitioner and as supervising physician I was immediately available for consultation/collaboration.  Keiley Levey T Bulmaro Feagans, MD 05/15/13 1800 

## 2013-07-02 ENCOUNTER — Other Ambulatory Visit (HOSPITAL_COMMUNITY): Payer: Self-pay | Admitting: Family Medicine

## 2013-07-02 ENCOUNTER — Ambulatory Visit (HOSPITAL_COMMUNITY)
Admission: RE | Admit: 2013-07-02 | Discharge: 2013-07-02 | Disposition: A | Discharge status: Home / Self Care | Source: Ambulatory Visit | Attending: Family Medicine | Admitting: Family Medicine

## 2013-07-02 DIAGNOSIS — M7730 Calcaneal spur, unspecified foot: Secondary | ICD-10-CM | POA: Insufficient documentation

## 2013-07-02 DIAGNOSIS — R52 Pain, unspecified: Secondary | ICD-10-CM

## 2013-07-12 ENCOUNTER — Emergency Department (HOSPITAL_COMMUNITY)
Admission: EM | Admit: 2013-07-12 | Discharge: 2013-07-12 | Disposition: A | Discharge status: Home / Self Care | Attending: Emergency Medicine | Admitting: Emergency Medicine

## 2013-07-12 ENCOUNTER — Encounter (HOSPITAL_COMMUNITY): Payer: Self-pay | Admitting: Emergency Medicine

## 2013-07-12 DIAGNOSIS — I10 Essential (primary) hypertension: Secondary | ICD-10-CM | POA: Insufficient documentation

## 2013-07-12 DIAGNOSIS — M25579 Pain in unspecified ankle and joints of unspecified foot: Secondary | ICD-10-CM | POA: Insufficient documentation

## 2013-07-12 DIAGNOSIS — Z79899 Other long term (current) drug therapy: Secondary | ICD-10-CM | POA: Insufficient documentation

## 2013-07-12 DIAGNOSIS — K219 Gastro-esophageal reflux disease without esophagitis: Secondary | ICD-10-CM | POA: Insufficient documentation

## 2013-07-12 DIAGNOSIS — M79671 Pain in right foot: Secondary | ICD-10-CM

## 2013-07-12 DIAGNOSIS — Z791 Long term (current) use of non-steroidal anti-inflammatories (NSAID): Secondary | ICD-10-CM | POA: Insufficient documentation

## 2013-07-12 MED ORDER — DEXAMETHASONE SODIUM PHOSPHATE 4 MG/ML IJ SOLN
10.0000 mg | Freq: Once | INTRAMUSCULAR | Status: AC
  Administered 2013-07-12: 10 mg via INTRAMUSCULAR
  Filled 2013-07-12: qty 3

## 2013-07-12 MED ORDER — TRAMADOL HCL 50 MG PO TABS: 50.0000 mg | ORAL_TABLET | Freq: Four times a day (QID) | ORAL | Status: DC | PRN

## 2013-07-12 NOTE — ED Provider Notes (Signed)
CSN: [id number]     Arrival date & time 07/12/13  1040 History  This chart was scribed for non-physician practitioner, Jaynie Crumble, PA-C working with Gilda Crease, MD by Greggory Stallion, ED scribe. This patient was seen in room TR09C/TR09C and the patient's care was started at 11:16 AM.   Chief Complaint  Patient presents with  . Foot Pain    Right   The history is provided by the patient. No language interpreter was used.   HPI Comments: Jane [name] is a 57 y.o. female who presents to the Emergency Department complaining of gradual onset, constant right foot pain that started 2 weeks ago. Bearing weight worsens the pain. She has tried ice with little relief. Pt was seen by PCP and told she had spurs and was given a pain medication with no relief. Her PCP suggested a cortisone shot but pt wanted to wait. Pt has another appointment with her PCP 12/5.   Past Medical History  Diagnosis Date  . Hypertension   . Reflux    Past Surgical History  Procedure Laterality Date  . Tubal ligation    . Colonoscopy     No family history on file. History  Substance Use Topics  . Smoking status: Never Smoker   . Smokeless tobacco: Not on file  . Alcohol Use: No   OB History   Grav Para Term Preterm Abortions TAB SAB Ect Mult Living                 Review of Systems  Musculoskeletal: Positive for arthralgias.  All other systems reviewed and are negative.    Allergies  Review of patient's allergies indicates no known allergies.  Home Medications   Current Outpatient Rx  Name  Route  Sig  Dispense  Refill  . diclofenac sodium (VOLTAREN) 1 % GEL   Topical   Apply 2 g topically 2 (two) times daily as needed (pain).         . hydrochlorothiazide (HYDRODIURIL) 25 MG tablet   Oral   Take 25 mg by mouth daily.         . lansoprazole (PREVACID) 15 MG capsule   Oral   Take 15 mg by mouth daily.         . meloxicam (MOBIC) 15 MG tablet   Oral   Take 1 tablet (15  mg total) by mouth daily.   15 tablet   0   . traMADol (ULTRAM) 50 MG tablet   Oral   Take 1 tablet (50 mg total) by mouth every 6 (six) hours as needed for pain.   11 tablet   0    BP 168/76  Pulse 70  Temp(Src) 98.6 F (37 C) (Oral)  Resp 18  Ht 5\' 5"  (1.651 m)  Wt 228 lb (103.42 kg)  BMI 37.94 kg/m2  SpO2 100%  Physical Exam  Nursing note and vitals reviewed. Constitutional: She is oriented to person, place, and time. She appears well-developed and well-nourished. No distress.  HENT:  Head: Normocephalic and atraumatic.  Eyes: EOM are normal.  Neck: Neck supple. No tracheal deviation present.  Cardiovascular: Normal rate.   Pulmonary/Chest: Effort normal. No respiratory distress.  Musculoskeletal: Normal range of motion.  Normal appearing right ankle and right foot. There is tenderness over lateral aspect of the right heel. Tenderness is mainly over the bottom of anterior cruciate ligament and lateral bony part of the heel. There is no tenderness over posterior tibial tendon is or plantar  fascia. Full range of motion of all toes. Full range of motion of the ankle joint with no pain.  Neurological: She is alert and oriented to person, place, and time.  Skin: Skin is warm and dry.  Psychiatric: She has a normal mood and affect. Her behavior is normal.    ED Course  Procedures (including critical care time)  DIAGNOSTIC STUDIES: Oxygen Saturation is 100% on RA, normal by my interpretation.    COORDINATION OF CARE: 11:22 AM-Discussed treatment plan which includes cortisone shot in buttock with pt at bedside and pt agreed to plan.   Labs Review Labs Reviewed - No data to display Imaging Review No results found.  EKG Interpretation   None       MDM   1. Heel pain, right     Patient with right heel pain. Had recent x-ray done which showed spurs. Patient states she has history on the past of the same with prior injections into the spur several years ago. States  that has helped her in the past. She is taking Lodine with no improvement. There is no pain or tenderness over a planar fashion. There is no tenderness over posterior tibial tendon. I do suspect her pain is most likely due to her spur. She will need to followup with her doctor for further evaluation. She was given a shot of Decadron ER which she requested, will give Ultram for additional pain relief    Filed Vitals:   07/12/13 1047  BP: 168/76  Pulse: 70  Temp: 98.6 F (37 C)  TempSrc: Oral  Resp: 18  Height: 5\' 5"  (1.651 m)  Weight: 228 lb (103.42 kg)  SpO2: 100%   I personally performed the services described in this documentation, which was scribed in my presence. The recorded information has been reviewed and is accurate.   Lottie Mussel, PA-C 07/12/13 1459

## 2013-07-12 NOTE — ED Notes (Signed)
Pt c/o right foot pain x 2 weeks. Pt was seen by PMD and told she had spurs. Pt reports prescribed pain medication ineffective. Pt ambulatory to room.

## 2013-07-14 NOTE — ED Provider Notes (Signed)
Medical screening examination/treatment/procedure(s) were performed by non-physician practitioner and as supervising physician I was immediately available for consultation/collaboration.  Zeah Germano J. Geordan Xu, MD 07/14/13 0819 

## 2013-09-15 ENCOUNTER — Emergency Department (HOSPITAL_COMMUNITY)
Admission: EM | Admit: 2013-09-15 | Discharge: 2013-09-15 | Disposition: A | Discharge status: Home / Self Care | Attending: Emergency Medicine | Admitting: Emergency Medicine

## 2013-09-15 ENCOUNTER — Encounter (HOSPITAL_COMMUNITY): Payer: Self-pay | Admitting: Emergency Medicine

## 2013-09-15 ENCOUNTER — Emergency Department (HOSPITAL_COMMUNITY)

## 2013-09-15 DIAGNOSIS — I10 Essential (primary) hypertension: Secondary | ICD-10-CM | POA: Insufficient documentation

## 2013-09-15 DIAGNOSIS — K219 Gastro-esophageal reflux disease without esophagitis: Secondary | ICD-10-CM | POA: Insufficient documentation

## 2013-09-15 DIAGNOSIS — Z79899 Other long term (current) drug therapy: Secondary | ICD-10-CM | POA: Insufficient documentation

## 2013-09-15 DIAGNOSIS — J069 Acute upper respiratory infection, unspecified: Secondary | ICD-10-CM

## 2013-09-15 DIAGNOSIS — Z791 Long term (current) use of non-steroidal anti-inflammatories (NSAID): Secondary | ICD-10-CM | POA: Insufficient documentation

## 2013-09-15 MED ORDER — DEXTROMETHORPHAN POLISTIREX 30 MG/5ML PO LQCR: 30.0000 mg | ORAL | Status: DC | PRN

## 2013-09-15 MED ORDER — DIPHENHYDRAMINE HCL 25 MG PO TABS: 25.0000 mg | ORAL_TABLET | Freq: Four times a day (QID) | ORAL | Status: DC | PRN

## 2013-09-15 NOTE — ED Provider Notes (Signed)
CSN: [id number]     Arrival date & time 09/15/13  4098 History  This chart was scribed for non-physician practitioner Alvina Chou, PA-C, working with Merryl Hacker, MD, by Neta Ehlers, ED Scribe. This patient was seen in room TR07C/TR07C and the patient's care was started at 11:11 AM.   First MD Initiated Contact with Patient 09/15/13 1003     Chief Complaint  Patient presents with  . Generalized Body Aches  . Chills  . Cough   Patient is a 58 y.o. female presenting with cough. The history is provided by the patient. No language interpreter was used.  Cough Cough characteristics:  Productive Sputum characteristics:  White and yellow Associated symptoms: chills, fever (100 F ), headaches and myalgias     HPI Comments: Jane [name] is a 58 y.o. female, with a h/o HTN,  who presents to the Emergency Department complaining of myalgia which began yesterday and has been associated with a cough productive of yellow and white phlegm, a scratchy throat, a fever, and chills. In the ED the pt's temperature is 100 F. She also reports headaches post coughing and several weeks of nasal congestion. She has taken Tylenol with moderate relief.  She denies pain with swallowing. The pt has been exposed to sick contacts. Ms. Shell is a non-smoker.   Past Medical History  Diagnosis Date  . Hypertension   . Reflux    Past Surgical History  Procedure Laterality Date  . Tubal ligation    . Colonoscopy     History reviewed. No pertinent family history. History  Substance Use Topics  . Smoking status: Never Smoker   . Smokeless tobacco: Not on file  . Alcohol Use: No   No OB history provided.  Review of Systems  Constitutional: Positive for fever (100 F ) and chills.  HENT: Positive for congestion.   Respiratory: Positive for cough.   Musculoskeletal: Positive for myalgias.  Neurological: Positive for headaches.  All other systems reviewed and are negative.   Allergies  Review of  patient's allergies indicates no known allergies.  Home Medications   Current Outpatient Rx  Name  Route  Sig  Dispense  Refill  . etodolac (LODINE) 500 MG tablet   Oral   Take 500 mg by mouth 2 (two) times daily.         . hydrochlorothiazide (HYDRODIURIL) 25 MG tablet   Oral   Take 25 mg by mouth daily.         . lansoprazole (PREVACID) 15 MG capsule   Oral   Take 15 mg by mouth daily.         . meloxicam (MOBIC) 15 MG tablet   Oral   Take 15 mg by mouth daily.         . traMADol (ULTRAM) 50 MG tablet   Oral   Take 1 tablet (50 mg total) by mouth every 6 (six) hours as needed for pain.   11 tablet   0   . traMADol (ULTRAM) 50 MG tablet   Oral   Take 1 tablet (50 mg total) by mouth every 6 (six) hours as needed.   15 tablet   0    Triage Vitals: BP 135/80  Pulse 88  Temp(Src) 100 F (37.8 C) (Oral)  Resp 20  SpO2 97%  Physical Exam  Nursing note and vitals reviewed. Constitutional: She is oriented to person, place, and time. She appears well-developed and well-nourished. No distress.  HENT:  Head: Normocephalic and  atraumatic.  Mild frontal and maxillary sinus tenderness.   Eyes: EOM are normal.  Neck: Neck supple. No tracheal deviation present.  Cardiovascular: Normal rate.   Pulmonary/Chest: Effort normal and breath sounds normal. No respiratory distress. She has no wheezes. She has no rales.  Musculoskeletal: Normal range of motion.  Neurological: She is alert and oriented to person, place, and time.  Skin: Skin is warm and dry.  Psychiatric: She has a normal mood and affect. Her behavior is normal.    ED Course  Procedures (including critical care time)  DIAGNOSTIC STUDIES: Oxygen Saturation is 97% on room air, normal by my interpretation.    COORDINATION OF CARE:  11:14 AM- Discussed treatment plan with patient, which includes an antibiotic and the suggestion to use Benadryl, and the patient agreed to the plan. She also requests a work  note.   Labs Review Labs Reviewed - No data to display Imaging Review Dg Chest 2 View  09/15/2013   CLINICAL DATA:  Cough, chills, body aches  EXAM: CHEST  2 VIEW  COMPARISON:  09/17/2012  FINDINGS: Cardiomediastinal silhouette is stable. No acute infiltrate or pleural effusion. No pulmonary edema. Mild degenerative change thoracic spine. Central mild bronchitic changes.  IMPRESSION: No acute infiltrate or pulmonary edema. Central mild bronchitic changes.   Electronically Signed   By: Lahoma Crocker M.D.   On: 09/15/2013 10:52    EKG Interpretation   None       MDM   1. URI (upper respiratory infection)    11:21 AM Patient likely has URI. Vitals stable and patient afebrile. Patient will have benadryl and delsym for congestion and cough as needed. Patient advised to return with worsening or concerning symptoms.   I personally performed the services described in this documentation, which was scribed in my presence. The recorded information has been reviewed and is accurate.    Alvina Chou, PA-C 09/15/13 1122

## 2013-09-15 NOTE — Discharge Instructions (Signed)
Take benadryl as needed for congestion. Take delsym as needed for cough. Refer to attached documents for more information. Return to the ED with worsening or concerning symptoms.

## 2013-09-15 NOTE — ED Notes (Signed)
Pt reports nasal congestion 3-4 weeks ago, yesterday pt began experiencing scratchy throat, generalized body aches, headache when coughing, and fever. Pt reports taking Tylenol with some relief and a decrease in temp. Productive cough with yellow/white colored phlegm.

## 2013-09-15 NOTE — ED Provider Notes (Signed)
Medical screening examination/treatment/procedure(s) were performed by non-physician practitioner and as supervising physician I was immediately available for consultation/collaboration.  EKG Interpretation   None        Merryl Hacker, MD 09/15/13 1201

## 2013-09-16 ENCOUNTER — Telehealth (HOSPITAL_COMMUNITY): Payer: Self-pay

## 2013-09-16 NOTE — ED Notes (Signed)
Pt calling states "the doctor that she saw said to call back if she wasn't better and she would call in antibiotic for pt"  Reviewed both PA and MD's note at time of visit and no mention of calling in antibiotic for pt noted.  Pt informed and advised to return as needed.

## 2013-09-17 ENCOUNTER — Emergency Department (HOSPITAL_COMMUNITY)
Admission: EM | Admit: 2013-09-17 | Discharge: 2013-09-17 | Disposition: A | Discharge status: Home / Self Care | Source: Home / Self Care | Attending: Emergency Medicine | Admitting: Emergency Medicine

## 2013-09-17 ENCOUNTER — Encounter (HOSPITAL_COMMUNITY): Payer: Self-pay | Admitting: Emergency Medicine

## 2013-09-17 DIAGNOSIS — J069 Acute upper respiratory infection, unspecified: Secondary | ICD-10-CM

## 2013-09-17 MED ORDER — IPRATROPIUM BROMIDE 0.06 % NA SOLN: 2.0000 | Freq: Four times a day (QID) | NASAL | Status: DC

## 2013-09-17 NOTE — ED Notes (Signed)
Pt  Has  [facility]  About  Plan of  Care  Pa  Back  In   To  Discuss   Treatment options  With  her

## 2013-09-17 NOTE — ED Provider Notes (Signed)
Medical screening examination/treatment/procedure(s) were performed by non-physician practitioner and as supervising physician I was immediately available for consultation/collaboration.  Philipp Deputy, M.D.   Harden Mo, MD 09/17/13 [phone]

## 2013-09-17 NOTE — Discharge Instructions (Signed)
Antibiotic Nonuse   Your caregiver felt that the infection or problem was not one that would be helped with an antibiotic.  Infections may be caused by viruses or bacteria. Only a caregiver can tell which one of these is the likely cause of an illness. A cold is the most common cause of infection in both adults and children. A cold is a virus. Antibiotic treatment will have no effect on a viral infection. Viruses can lead to many lost days of work caring for sick children and many missed days of school. Children may catch as many as 10 "colds" or "flus" per year during which they can be tearful, cranky, and uncomfortable. The goal of treating a virus is aimed at keeping the ill person comfortable.  Antibiotics are medications used to help the body fight bacterial infections. There are relatively few types of bacteria that cause infections but there are hundreds of viruses. While both viruses and bacteria cause infection they are very different types of germs. A viral infection will typically go away by itself within 7 to 10 days. Bacterial infections may spread or get worse without antibiotic treatment.  Examples of bacterial infections are:   Sore throats (like strep throat or tonsillitis).   Infection in the lung (pneumonia).   Ear and skin infections.  Examples of viral infections are:   Colds or flus.   Most coughs and bronchitis.   Sore throats not caused by Strep.   Runny noses.  It is often best not to take an antibiotic when a viral infection is the cause of the problem. Antibiotics can kill off the helpful bacteria that we have inside our body and allow harmful bacteria to start growing. Antibiotics can cause side effects such as allergies, nausea, and diarrhea without helping to improve the symptoms of the viral infection. Additionally, repeated uses of antibiotics can cause bacteria inside of our body to become resistant. That resistance can be passed onto harmful bacterial. The next time you have  an infection it may be harder to treat if antibiotics are used when they are not needed. Not treating with antibiotics allows our own immune system to develop and take care of infections more efficiently. Also, antibiotics will work better for us when they are prescribed for bacterial infections.  Treatments for a child that is ill may include:   Give extra fluids throughout the day to stay hydrated.   Get plenty of rest.   Only give your child over-the-counter or prescription medicines for pain, discomfort, or fever as directed by your caregiver.   The use of a cool mist humidifier may help stuffy noses.   Cold medications if suggested by your caregiver.  Your caregiver may decide to start you on an antibiotic if:   The problem you were seen for today continues for a longer length of time than expected.   You develop a secondary bacterial infection.  SEEK MEDICAL CARE IF:   Fever lasts longer than 5 days.   Symptoms continue to get worse after 5 to 7 days or become severe.   Difficulty in breathing develops.   Signs of dehydration develop (poor drinking, rare urinating, dark colored urine).   Changes in behavior or worsening tiredness (listlessness or lethargy).  Document Released: 10/15/2001 Document Revised: 10/29/2011 Document Reviewed: 04/13/2009  [employer] Patient Information 2014 [employer].

## 2013-09-17 NOTE — ED Provider Notes (Signed)
CSN: [id number]     Arrival date & time 09/17/13  1036 History   First MD Initiated Contact with Patient 09/17/13 1150     Chief Complaint  Patient presents with  . Headache   (Consider location/radiation/quality/duration/timing/severity/associated sxs/prior Treatment) HPI Comments: Presents with 2 day history of fever, cough, nasal congestion and frontal sinus pressure/sinus headache. Patient states she was seen for same on day symptoms began (09/15/2013) at [facility] ER and advised to use symptomatic care at home. States she has been using delsym which has helped her cough, but she states she has not taken benadryl as the first dose upset her stomach. States she is here because her symptoms have not improved. Patient is non-smoker PCP: none  Patient is a 58 y.o. female presenting with headaches. The history is provided by the patient.  Headache Associated symptoms: congestion, cough, drainage, fever and sinus pressure   Associated symptoms: no dizziness, no ear pain, no pain, no hearing loss, no numbness, no photophobia, no seizures and no sore throat     Past Medical History  Diagnosis Date  . Hypertension   . Reflux    Past Surgical History  Procedure Laterality Date  . Tubal ligation    . Colonoscopy     History reviewed. No pertinent family history. History  Substance Use Topics  . Smoking status: Never Smoker   . Smokeless tobacco: Not on file  . Alcohol Use: No   OB History   Grav Para Term Preterm Abortions TAB SAB Ect Mult Living                 Review of Systems  Constitutional: Positive for fever.  HENT: Positive for congestion, postnasal drip, rhinorrhea and sinus pressure. Negative for ear pain, facial swelling, hearing loss, sore throat, tinnitus and trouble swallowing.   Eyes: Negative.  Negative for photophobia, pain, discharge, redness, itching and visual disturbance.       +reports mild frontal sinus discomfort with eye movement  Respiratory: Positive  for cough. Negative for chest tightness, shortness of breath and wheezing.   Cardiovascular: Negative.   Gastrointestinal: Negative.   Genitourinary: Negative.   Musculoskeletal: Negative.   Skin: Negative.   Allergic/Immunologic: Negative for immunocompromised state.  Neurological: Positive for headaches. Negative for dizziness, seizures, syncope, weakness, light-headedness and numbness.  Hematological: Negative for adenopathy.  Psychiatric/Behavioral: Negative for behavioral problems and confusion.    Allergies  Review of patient's allergies indicates no known allergies.  Home Medications   Current Outpatient Rx  Name  Route  Sig  Dispense  Refill  . Ascorbic Acid (VITAMIN C PO)   Oral   Take 1 tablet by mouth daily.         . Cholecalciferol (VITAMIN D-3 PO)   Oral   Take 1 tablet by mouth daily.         Marland Kitchen dextromethorphan (DELSYM) 30 MG/5ML liquid   Oral   Take 5 mLs (30 mg total) by mouth as needed for cough.   89 mL   0   . diphenhydrAMINE (BENADRYL) 25 MG tablet   Oral   Take 1 tablet (25 mg total) by mouth every 6 (six) hours as needed for itching (Rash).   30 tablet   0   . hydrochlorothiazide (HYDRODIURIL) 25 MG tablet   Oral   Take 25 mg by mouth daily.         . lansoprazole (PREVACID) 15 MG capsule   Oral   Take 15 mg by  mouth daily.         . traMADol (ULTRAM) 50 MG tablet   Oral   Take 50 mg by mouth every 6 (six) hours as needed for moderate pain.         Marland Kitchen VITAMIN E PO   Oral   Take 1 capsule by mouth daily.          BP 130/72  Pulse 78  Temp(Src) 100.2 F (37.9 C) (Oral)  Resp 16  SpO2 100% Physical Exam  Nursing note and vitals reviewed. Constitutional: She is oriented to person, place, and time. She appears well-developed and well-nourished. No distress.  HENT:  Head: Normocephalic and atraumatic.  Right Ear: Hearing, tympanic membrane, external ear and ear canal normal.  Left Ear: Hearing, tympanic membrane, external  ear and ear canal normal.  Nose: Nose normal. Right sinus exhibits no maxillary sinus tenderness and no frontal sinus tenderness. Left sinus exhibits no maxillary sinus tenderness and no frontal sinus tenderness.  Mouth/Throat: Uvula is midline, oropharynx is clear and moist and mucous membranes are normal. No oropharyngeal exudate.  Eyes: Conjunctivae and EOM are normal. Pupils are equal, round, and reactive to light. Right eye exhibits no discharge. Left eye exhibits no discharge. No scleral icterus.  Neck: Normal range of motion. Neck supple. No thyromegaly present.  Cardiovascular: Normal rate, regular rhythm and normal heart sounds.   Pulmonary/Chest: Effort normal and breath sounds normal. No respiratory distress. She has no wheezes. She has no rales.  Abdominal: Soft. Bowel sounds are normal. She exhibits no distension. There is no tenderness.  Musculoskeletal: Normal range of motion.  Lymphadenopathy:    She has no cervical adenopathy.  Neurological: She is alert and oriented to person, place, and time.  Skin: Skin is warm and dry.  Psychiatric: She has a normal mood and affect. Her behavior is normal.    ED Course  Procedures (including critical care time) Labs Review Labs Reviewed - No data to display Imaging Review No results found.  EKG Interpretation    Date/Time:    Ventricular Rate:    PR Interval:    QRS Duration:   QT Interval:    QTC Calculation:   R Axis:     Text Interpretation:              MDM  Patient's exam consistent with URI. No clinical indication for antibiotics. Advised that she begin using atrovent nasal spray as directed to help with sinus congestion and sinus pressure given that she could not tolerate oral benadryl. Suggested plenty of fluids and rest. Tylenol and/or ibuprofen as directed on packaging for pain and fever. Follow up if no improvement in 4-5 days.     [place], [place] 09/17/13 [place],  [place] 09/17/13 [phone]

## 2013-09-17 NOTE — ED Notes (Signed)
Pt  Seen  sev  Days  Ago  In  Er  For uri    Had  cxr  Done  noo  RX  Written pt  States  Only  otc  meds  -  Pt  States  Is  Worse   C/o  Frontal   Sinus  Headache  Behind  Eyes  With  Pressure  And  Low  Grade  Fever

## 2013-11-13 ENCOUNTER — Encounter (HOSPITAL_COMMUNITY): Payer: Self-pay | Admitting: Emergency Medicine

## 2013-11-13 ENCOUNTER — Emergency Department (HOSPITAL_COMMUNITY)
Admission: EM | Admit: 2013-11-13 | Discharge: 2013-11-13 | Disposition: A | Discharge status: Home / Self Care | Attending: Emergency Medicine | Admitting: Emergency Medicine

## 2013-11-13 DIAGNOSIS — R221 Localized swelling, mass and lump, neck: Secondary | ICD-10-CM

## 2013-11-13 DIAGNOSIS — R590 Localized enlarged lymph nodes: Secondary | ICD-10-CM

## 2013-11-13 DIAGNOSIS — R220 Localized swelling, mass and lump, head: Secondary | ICD-10-CM | POA: Insufficient documentation

## 2013-11-13 DIAGNOSIS — Z79899 Other long term (current) drug therapy: Secondary | ICD-10-CM | POA: Insufficient documentation

## 2013-11-13 DIAGNOSIS — R599 Enlarged lymph nodes, unspecified: Secondary | ICD-10-CM | POA: Insufficient documentation

## 2013-11-13 DIAGNOSIS — K219 Gastro-esophageal reflux disease without esophagitis: Secondary | ICD-10-CM | POA: Insufficient documentation

## 2013-11-13 DIAGNOSIS — I10 Essential (primary) hypertension: Secondary | ICD-10-CM | POA: Insufficient documentation

## 2013-11-13 NOTE — ED Provider Notes (Signed)
  Face-to-face evaluation   History: The patient noticed an abnormal appearance to the area beneath her chin, then felt it and it felt swollen. The area is minimally uncomfortable to touch. The patient consents a "knot", there. She does not have any constitutional symptoms. She has never had this previously.  Physical exam: Mild submental edema, with palpable nodularity, that is indistinct, and external (not felt on internal palpation, beneath tongue). There are several 0.5 centimeters, irregularities that are immobile, and seemed adherent to the floor of the sub-lingual space. There is no associated adenopathy of the anterior or lateral neck. There is no thyromegaly or thyroid nodularity. There is no stridor. The neck motion is normal. The patient does not appear ill.  Medical screening examination/treatment/procedure(s) were conducted as a shared visit with non-physician practitioner(s) and myself.  I personally evaluated the patient during the encounter  Richarda Blade, MD 11/15/13 2116

## 2013-11-13 NOTE — ED Provider Notes (Signed)
CSN: [id number]     Arrival date & time 11/13/13  0907 History   First MD Initiated Contact with Patient 11/13/13 [phone]     Chief Complaint  Patient presents with  . Skin Problem     (Consider location/radiation/quality/duration/timing/severity/associated sxs/prior Treatment) The history is provided by the patient. No language interpreter was used.  Jane [name] is a 58 year old female with past medical history of hypertension, reflux presenting to the ED with a knot underneath her chin. Patient reported that while she was looking in the near yesterday she noted area of swelling to her chin region. Stated that when she palpated the region was mildly tender. Stated that there is a soreness upon palpation. Stated that she has not been taking it in the discomfort or applying any topicals. Reported that she only noticed this yesterday and does not know how long it has been there for. Denied sore throat, difficulty breathing or difficulty swallowing, throat closing sensation, fever, chills, chest pain, shortness of breath, difficulty breathing, ear pain, neck pain, neck stiffness. Denied history of cancer. Denied cigarette use. PCP none - patient is in process of getting a primary care provider, is currently working with insurance company  Past Medical History  Diagnosis Date  . Hypertension   . Reflux    Past Surgical History  Procedure Laterality Date  . Tubal ligation    . Colonoscopy     History reviewed. No pertinent family history. History  Substance Use Topics  . Smoking status: Never Smoker   . Smokeless tobacco: Not on file  . Alcohol Use: No   OB History   Grav Para Term Preterm Abortions TAB SAB Ect Mult Living                 Review of Systems  Constitutional: Negative for fever and chills.  HENT: Positive for facial swelling (chin region ).   Eyes: Negative for visual disturbance.  Respiratory: Negative for chest tightness, shortness of breath and stridor.     Cardiovascular: Negative for chest pain.  Gastrointestinal: Negative for nausea and vomiting.  Musculoskeletal: Negative for neck pain and neck stiffness.  Neurological: Negative for dizziness and weakness.  All other systems reviewed and are negative.      Allergies  Review of patient's allergies indicates no known allergies.  Home Medications   Current Outpatient Rx  Name  Route  Sig  Dispense  Refill  . Cholecalciferol (VITAMIN D-3 PO)   Oral   Take 1 tablet by mouth daily.         . lansoprazole (PREVACID) 15 MG capsule   Oral   Take 15 mg by mouth daily.         Marland Kitchen VITAMIN E PO   Oral   Take 1 capsule by mouth daily.          BP 154/87  Pulse 91  Temp(Src) 98.5 F (36.9 C) (Oral)  Resp 16  Ht 5\' 5"  (1.651 m)  Wt 228 lb (103.42 kg)  BMI 37.94 kg/m2  SpO2 100% Physical Exam  Nursing note and vitals reviewed. Constitutional: She is oriented to person, place, and time. She appears well-developed and well-nourished. No distress.  HENT:  Head: Normocephalic and atraumatic.  Mouth/Throat: Oropharynx is clear and moist. No oropharyngeal exudate.  Poor dentition identified with negative disease noted, abscess formation, erythema or active drainage or bleeding noted. Negative trismus Negative pain upon palpation to the floor of the mouth-negative sublingual lesions noted Uvula midline with symmetrical elevation  Eyes: Conjunctivae are normal. Pupils are equal, round, and reactive to light. Right eye exhibits no discharge. Left eye exhibits no discharge.  Negative nystagmus Visual fields grossly intact   Neck: Normal range of motion. Neck supple.    Mild swelling localized to the submental region with negative erythema, inflammation, warmth upon palpation. Negative cellulitic findings noted. Discomfort upon palpation. Soft upon palpation and mobile. Suspicion to be possible lymph node. Negative neck stiffness. Negative nuchal rigidity. Negative pain upon  palpation to the C-spine. Negative meningeal signs. Negative posterior or lateral adenopathy noted.  Mild left sided adenopathy noted - soft, mobile, nontender  Cardiovascular: Normal rate, regular rhythm and normal heart sounds.  Exam reveals no friction rub.   No murmur heard. Pulses:      Radial pulses are 2+ on the right side, and 2+ on the left side.  Pulmonary/Chest: Effort normal and breath sounds normal. No stridor. No respiratory distress. She has no wheezes. She has no rales.  Patient is able to speak in full sentences without difficulty Negative use of accessory muscles Negative stridor  Lymphadenopathy:    She has cervical adenopathy.  Neurological: She is alert and oriented to person, place, and time. No cranial nerve deficit. She exhibits normal muscle tone. Coordination normal.  Skin: Skin is warm and dry. No rash noted. She is not diaphoretic. No erythema.  Psychiatric: She has a normal mood and affect. Her behavior is normal. Thought content normal.    ED Course  Procedures (including critical care time)  10:14 AM Patient seen and assessed by attending physician. Physician recommended that patient be discharged - did not recommend imaging at this point in time. Recommended patient to have referral to ENT to be reassessed by the end of next week.  Labs Review Labs Reviewed - No data to display Imaging Review No results found.   EKG Interpretation None      MDM   Final diagnoses:  Submental lymphadenopathy    Filed Vitals:   11/13/13 0914  BP: 154/87  Pulse: 91  Temp: 98.5 F (36.9 C)  TempSrc: Oral  Resp: 16  Height: 5\' 5"  (1.651 m)  Weight: 228 lb (103.42 kg)  SpO2: 100%   Patient presenting to the ED with not localized to the chin that was noticed yesterday with a mild soreness upon palpation. Denied history of cancer. History of cigarette use. Alert and oriented. GCS 15. Heart rate and rhythm normal. Lungs clear to auscultation to upper and lower  lobes bilaterally. Negative stridor. Radial pulses 2+ bilaterally. Approximately 2 cm x 2 cm area of edema localized to the submental region with discomfort upon palpation, firm. Negative warmth upon palpation, negative induration. Negative trismus. Negative palpation sublingual lesions. Uvula midline with symmetrical elevation. Negative nuchal rigidity, negative pain upon palpation to C-spine. Mild left cervical lymphadenopathy identified-soft, mobile. Poor dentition noted-negative findings of periapical abscess, peritonsillar abscess or gingivitis. Doubt issue from dental problem. Doubt abscess. Doubt infectious process. Suspicion to be possible beginnings of cancers findings - unknown etiology of lymphadenopathy. Patient seen and assessed by attending physician who did not recommend imaging or antibiotics at this time. Recommended ENT follow up. Patient stable, afebrile. Patient non-septic appearing. Discharged patient. Referred patient to ENT to follow-up by next week. Discussed with patient to closely monitor symptoms and if symptoms are to worsen or change to report back to the ED - strict return instructions given.  Patient agreed to plan of care, understood, all questions answered.  Jamse Mead, PA-C 11/13/13 1842

## 2013-11-13 NOTE — ED Notes (Signed)
PA at bedside speaking with patient and plan of care.

## 2013-11-13 NOTE — Discharge Instructions (Signed)
Please call and set-up an appointment with Dr. Erik Obey, ENT physician to be assessed by next week for further testing to be performed Please continue to rest and stay hydrated Please continue to monitor symptoms closely and if symptoms are to worsen or change (fever greater than 101, chills, sweating, nausea, vomiting, neck pain, neck stiffness, throat closing sensation, dental pain, difficulty swallowing, difficulty breathing, swelling gets worse or skin color changes) please report back to the ED immediately   Lymphadenopathy Lymphadenopathy means "disease of the lymph glands." But the term is usually used to describe swollen or enlarged lymph glands, also called lymph nodes. These are the bean-shaped organs found in many locations including the neck, underarm, and groin. Lymph glands are part of the immune system, which fights infections in your body. Lymphadenopathy can occur in just one area of the body, such as the neck, or it can be generalized, with lymph node enlargement in several areas. The nodes found in the neck are the most common sites of lymphadenopathy. CAUSES  When your immune system responds to germs (such as viruses or bacteria ), infection-fighting cells and fluid build up. This causes the glands to grow in size. This is usually not something to worry about. Sometimes, the glands themselves can become infected and inflamed. This is called lymphadenitis. Enlarged lymph nodes can be caused by many diseases:  Bacterial disease, such as strep throat or a skin infection.  Viral disease, such as a common cold.  Other germs, such as lyme disease, tuberculosis, or sexually transmitted diseases.  Cancers, such as lymphoma (cancer of the lymphatic system) or leukemia (cancer of the white blood cells).  Inflammatory diseases such as lupus or rheumatoid arthritis.  Reactions to medications. Many of the diseases above are rare, but important. This is why you should see your caregiver if  you have lymphadenopathy. SYMPTOMS   Swollen, enlarged lumps in the neck, back of the head or other locations.  Tenderness.  Warmth or redness of the skin over the lymph nodes.  Fever. DIAGNOSIS  Enlarged lymph nodes are often near the source of infection. They can help healthcare providers diagnose your illness. For instance:   Swollen lymph nodes around the jaw might be caused by an infection in the mouth.  Enlarged glands in the neck often signal a throat infection.  Lymph nodes that are swollen in more than one area often indicate an illness caused by a virus. Your caregiver most likely will know what is causing your lymphadenopathy after listening to your history and examining you. Blood tests, x-rays or other tests may be needed. If the cause of the enlarged lymph node cannot be found, and it does not go away by itself, then a biopsy may be needed. Your caregiver will discuss this with you. TREATMENT  Treatment for your enlarged lymph nodes will depend on the cause. Many times the nodes will shrink to normal size by themselves, with no treatment. Antibiotics or other medicines may be needed for infection. Only take over-the-counter or prescription medicines for pain, discomfort or fever as directed by your caregiver. HOME CARE INSTRUCTIONS  Swollen lymph glands usually return to normal when the underlying medical condition goes away. If they persist, contact your health-care provider. He/she might prescribe antibiotics or other treatments, depending on the diagnosis. Take any medications exactly as prescribed. Keep any follow-up appointments made to check on the condition of your enlarged nodes.  SEEK MEDICAL CARE IF:   Swelling lasts for more than two weeks.  You have symptoms such as weight loss, night sweats, fatigue or fever that does not go away.  The lymph nodes are hard, seem fixed to the skin or are growing rapidly.  Skin over the lymph nodes is red and inflamed. This  could mean there is an infection. SEEK IMMEDIATE MEDICAL CARE IF:   Fluid starts leaking from the area of the enlarged lymph node.  You develop a fever of 102 F (38.9 C) or greater.  Severe pain develops (not necessarily at the site of a large lymph node).  You develop chest pain or shortness of breath.  You develop worsening abdominal pain. MAKE SURE YOU:   Understand these instructions.  Will watch your condition.  Will get help right away if you are not doing well or get worse. Document Released: 05/15/2008 Document Revised: 10/29/2011 Document Reviewed: 05/15/2008 [facility] Patient Information 2014 [place].   Emergency Department Resource Guide 1) Find a Doctor and Pay Out of Pocket Although you won't have to find out who is covered by your insurance plan, it is a good idea to ask around and get recommendations. You will then need to call the office and see if the doctor you have chosen will accept you as a new patient and what types of options they offer for patients who are self-pay. Some doctors offer discounts or will set up payment plans for their patients who do not have insurance, but you will need to ask so you aren't surprised when you get to your appointment.  2) Contact Your Local Health Department Not all health departments have doctors that can see patients for sick visits, but many do, so it is worth a call to see if yours does. If you don't know where your local health department is, you can check in your phone book. The CDC also has a tool to help you locate your state's health department, and many state websites also have listings of all of their local health departments.  3) Find a [facility] If your illness is not likely to be very severe or complicated, you may want to try a walk in clinic. These are popping up all over the country in pharmacies, drugstores, and shopping centers. They're usually staffed by nurse practitioners or physician assistants  that have been trained to treat common illnesses and complaints. They're usually fairly quick and inexpensive. However, if you have serious medical issues or chronic medical problems, these are probably not your best option.  No Primary Care Doctor: - Call Health Connect at  [phone] - they can help you locate a primary care doctor that  accepts your insurance, provides certain services, etc. - Physician Referral Service- [phone]  Chronic Pain Problems: Organization         Address  Phone   Notes  [facility]  [phone] Patients need to be referred by their primary care doctor.   Medication Assistance: Organization         Address  Phone   Notes  [facility] Medication [facility] [address] [place], [place] [address] [phone] --Must be a resident of [facility] -- Must have NO insurance coverage whatsoever (no Medicaid/ Medicare, etc.) -- The pt. MUST have a primary care doctor that directs their care regularly and follows them in the community   MedAssist  [phone]   [employer]  [phone]    Agencies that provide inexpensive medical care: Organization  Address  Phone   Notes  [place]  [phone]   [facility]    [phone]   [facility] [place], [place] [address] [phone]   [place] [address] [place] [phone]   Planned Parenthood    [phone]   [facility]    [phone]   [place] and [place] [address], [place] Phone:  [phone], Fax:  [phone] Hours of Operation:  9 am - 6 pm, M-F.  Also accepts Medicaid/Medicare and self-pay.  [facility] for [place] [address], [address], [place] Phone: [phone], Fax: [phone]. Hours of Operation:  8:30 am - 5:30 pm, M-F.  Also accepts Medicaid  and self-pay.  [facility] [place] [address] [place] Phone: [phone]   [place], [place], [place] [phone], Ext. [phone] Mondays & Thursdays: 7-9 AM.  First 15 patients are seen on a first come, first serve basis.    [place] Providers:  Organization         Address  Phone   Notes  [facility] [address], [place] [phone] Also accepts self-pay patients.  [facility] [address], [place]  [phone]   [place], [address], [place] [phone]   [facility] [address], [place] [phone]   Lucianne Lei [address], [place]   [phone] Only accepts [place] Access [place] patients after they have their name applied to their card.   Self-Pay (no insurance) in [facility]:  Organization         Address  Phone   Notes  Sickle Cell Patients, [facility] Internal Medicine [place] [phone]   [facility] [place] [phone]   [facility] [place]  [place], [place], [place] [phone]   [facility]/Dr. Osei-Bonsu  [address] [place] or [address], [place] [phone] Phone number for both [place] and [place] locations is the same.  Urgent Medical and [facility] [address] [place] [phone]   [facility] [address], [place] or [address] [phone]   [facility] [address] [place] [phone], phone; [phone], fax Sees patients 1st and 3rd Saturday of every month.  Must not qualify for public or private insurance (i.e. Medicaid, Medicare, [place] Health Choice, Veterans' Benefits)  Household income  should be no more than 200% of the poverty level The clinic cannot treat you if you are pregnant or think you are pregnant  Sexually transmitted diseases are not treated at the clinic.    Dental Care: Organization         Address  Phone  Notes  [facility] Department of [facility] [place] [phone] Accepts children up to age 3 who are enrolled in [place] or [place]; pregnant women with a Medicaid card; and children who have applied for Medicaid or [place] Health Choice, but were declined, whose parents can pay a reduced fee at time  of service.  [facility] Department of [facility]  [address], [place] [phone] Accepts children up to age 51 who are enrolled in [place] or [place]; pregnant women with a Medicaid card; and children who have applied for Medicaid or [place] Health Choice, but were declined, whose parents can pay a reduced fee at time of service.  [place] Adult Dental Access PROGRAM  [place] [phone] Patients are seen by appointment only. Walk-ins are not accepted. [place] will see patients 33 years of age and older. Monday - Tuesday (8am-5pm) Most Wednesdays (8:30-5pm) $30 per visit, cash only  [facility] Adult Dental Access PROGRAM  [address], [facility] [phone] Patients are seen by appointment only. Walk-ins are not accepted. [place] will see patients 44 years of age and older. One Wednesday Evening (Monthly: Volunteer Based).  $30 per visit, cash only  [place]  [phone] for adults; Children under age 38, call Graduate Pediatric Dentistry at [phone]. Children aged 82-14, please call [phone] to request a pediatric application.  Dental services are provided in all areas of dental care including fillings, crowns and bridges, complete and partial dentures, implants, gum treatment,  root canals, and extractions. Preventive care is also provided. Treatment is provided to both adults and children. Patients are selected via a lottery and there is often a waiting list.   [facility] [address] [place]  [phone] [email or web address]   [facility] [address] [place], [place] [phone], Ext. [phone] Second and Fourth Thursday of each month, opens at 6:30 AM; Clinic ends at 9 AM.  Patients are seen on a first-come first-served basis, and a limited number are seen during each clinic.   [facility]  [address] Hillard Danker [place], [place] [phone]   Eligibility Requirements You must have lived in [place], [place], or [place] counties for at least the last three months.   You cannot be eligible for state or federal sponsored [employer], including [employer], [place], or [employer].   You generally cannot be eligible for healthcare insurance through your employer.    How to apply: Eligibility screenings are held every Tuesday and Wednesday afternoon from 1:00 pm until 4:00 pm. You do not need an appointment for the interview!  [facility] [address] [place], [place]   [place]  [place] Department  [place]  [phone]    Behavioral Health Resources in the Community: Intensive Outpatient Programs Organization         Address  Phone  Notes  [place] [address] [place], [place] [phone]   [facility] Outpatient [address], [place], [place]   ADS: Alcohol & Drug S[facility] [address], [place], [place]   [place] [address],  [place], [place] or [phone]   Substance Abuse Resources Organization         Address  Phone  Notes  Alcohol and Drug Services   [phone]   [place]  [phone]   [facility]   Chinita Pester  [phone]   Residential & Outpatient Substance Abuse Program  [phone]   Psychological Services Organization         Address  Phone  Notes  [facility]  [place]  [phone]   [place] [address], [place] or [phone]    Mobile Crisis Teams Organization         Address  Phone  Notes  [facility], Mobile Crisis Care Unit  [phone]   [facility]otherapeutic [facility]  [address] [place], [place]   [place] [address], [place] [phone]    Self-Help/Support Groups Organization         Address  Phone             Notes  [place]. of [place] - variety of support groups  [place] Call for more information  Narcotics Anonymous (NA), [facility] [address], [employer] [place]  2 meetings at this location   Special educational needs teacher         Address  Phone  Notes  [facility]idential Treatment [place],    [place]  [phone]   [facility]  [address] [place] [phone], [place], [place]   [place] [address], [place] [phone] Admissions: 8am-3pm M-F  Incentives Substance [place] [address],    [place], [place] [phone]   [facility] [address] [place], [place], [place]   [facility] [address]  [place], [place]   [facility] Programs - Intensive Outpatient [address], [place], [place]   [facility] [address]  [place], [place] [phone] or [phone]   [facility] Services (RTS) [address] [place], [place] Accepts Medicaid  [facility] [place] [address],  [place] [phone] Substance Abuse/Addiction Treatment   [facility] Organization         Address  Phone  Notes  [facility]  [phone]   Domenic Schwab, PhD [address] Arlis [place], [place]   [phone] or [phone]   C[place]on [place], [place] [phone]   [facility] [address], [place], [place] [phone] Insurance/Medicaid/sponsorship through [facility] and Families [address] [place]                                    [place], [place] [phone] [place] [address], [place] [phone]    Dr. Adele Schilder  [phone]   [facility] Dept. 1) [address], [place] 2) [place] 3)  [place] [address], [place] [phone]  [phone]   [place] [phone] or [phone] (After Hours)

## 2013-11-13 NOTE — ED Notes (Signed)
Pt reports she woke yesterday am with a lump under her chin, states it is " a little bit painful." no redness is noted but she can feel a "hard knot under the skin.

## 2013-11-13 NOTE — ED Notes (Signed)
Pt undressed, in gown, on continuous pulse oximetry and blood pressure cuff 

## 2013-12-01 DIAGNOSIS — I10 Essential (primary) hypertension: Secondary | ICD-10-CM | POA: Insufficient documentation

## 2013-12-01 DIAGNOSIS — K219 Gastro-esophageal reflux disease without esophagitis: Secondary | ICD-10-CM | POA: Insufficient documentation

## 2013-12-20 ENCOUNTER — Encounter (HOSPITAL_COMMUNITY): Payer: Self-pay | Admitting: Emergency Medicine

## 2013-12-20 ENCOUNTER — Emergency Department (HOSPITAL_COMMUNITY)

## 2013-12-20 ENCOUNTER — Emergency Department (HOSPITAL_COMMUNITY)
Admission: EM | Admit: 2013-12-20 | Discharge: 2013-12-20 | Disposition: A | Discharge status: Home / Self Care | Attending: Emergency Medicine | Admitting: Emergency Medicine

## 2013-12-20 DIAGNOSIS — M25519 Pain in unspecified shoulder: Secondary | ICD-10-CM | POA: Insufficient documentation

## 2013-12-20 DIAGNOSIS — M25511 Pain in right shoulder: Secondary | ICD-10-CM

## 2013-12-20 DIAGNOSIS — I10 Essential (primary) hypertension: Secondary | ICD-10-CM | POA: Insufficient documentation

## 2013-12-20 DIAGNOSIS — K219 Gastro-esophageal reflux disease without esophagitis: Secondary | ICD-10-CM | POA: Insufficient documentation

## 2013-12-20 DIAGNOSIS — Z79899 Other long term (current) drug therapy: Secondary | ICD-10-CM | POA: Insufficient documentation

## 2013-12-20 MED ORDER — IBUPROFEN 400 MG PO TABS
600.0000 mg | ORAL_TABLET | Freq: Once | ORAL | Status: AC
  Administered 2013-12-20: 600 mg via ORAL
  Filled 2013-12-20 (×2): qty 1

## 2013-12-20 NOTE — ED Notes (Signed)
Patient transported to X-ray 

## 2013-12-20 NOTE — Discharge Instructions (Signed)
Take ibuprofen and Tylenol as needed as well as use ice. Try to rest her shoulder and limit repetitive movement. If you were given medicines take as directed.  If you are on coumadin or contraceptives realize their levels and effectiveness is altered by many different medicines.  If you have any reaction (rash, tongues swelling, other) to the medicines stop taking and see a physician.   Please follow up as directed and return to the ER or see a physician for new or worsening symptoms.  Thank you. Filed Vitals:   12/20/13 0855 12/20/13 1020  BP: 157/133 174/90  Pulse: 71 64  Temp: 98.9 F (37.2 C)   TempSrc: Oral   Resp: 18 18  SpO2: 100% 98%

## 2013-12-20 NOTE — ED Notes (Signed)
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 No other injury. ROM intact, pain worse when lifting. Has been taking aleve for pain with relief.

## 2013-12-20 NOTE — ED Provider Notes (Signed)
CSN: [id number]     Arrival date & time 12/20/13  2841 History   First MD Initiated Contact with Patient 12/20/13 [phone]     Chief Complaint  Patient presents with  . Shoulder Pain     (Consider location/radiation/quality/duration/timing/severity/associated sxs/prior Treatment) HPI Comments: 58 year old female with no history of shoulder surgery significant medical history except high blood pressure presents with right shoulder pain intermittent for the past month. 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 It improves with rest. His no chest pain involved. Specific movements isolate the pain. Relief with Motrin.  Patient is a 58 y.o. female presenting with shoulder pain. The history is provided by the patient.  Shoulder Pain Pertinent negatives include no chest pain and no shortness of breath.    Past Medical History  Diagnosis Date  . Hypertension   . Reflux    Past Surgical History  Procedure Laterality Date  . Tubal ligation    . Colonoscopy     No family history on file. History  Substance Use Topics  . Smoking status: Never Smoker   . Smokeless tobacco: Not on file  . Alcohol Use: No   OB History   Grav Para Term Preterm Abortions TAB SAB Ect Mult Living                 Review of Systems  Constitutional: Negative for fever.  Respiratory: Negative for shortness of breath.   Cardiovascular: Negative for chest pain.  Musculoskeletal: Positive for arthralgias. Negative for joint swelling.      Allergies  Review of patient's allergies indicates no known allergies.  Home Medications   Prior to Admission medications   Medication Sig Start Date End Date Taking? Authorizing Provider  Cholecalciferol (VITAMIN D-3 PO) Take 1 tablet by mouth daily.    Historical Provider, MD  lansoprazole (PREVACID) 15 MG capsule Take 15 mg by mouth daily.    Historical Provider, MD  VITAMIN E PO Take 1 capsule by mouth daily.     Historical Provider, MD   BP 157/133  Pulse 71  Temp(Src) 98.9 F (37.2 C) (Oral)  Resp 18  SpO2 100% Physical Exam  Nursing note and vitals reviewed. Constitutional: She appears well-developed and well-nourished. No distress.  Musculoskeletal: She exhibits tenderness.  Patient has full range of motion of the right shoulder with flexion, extension, external and internal rotation. Patient has isolated mild pain with empty can test however ligaments strong. Patient has isolated pain with external rotation. Neurovascularly intact. No pain with raising arm overhead.  Neurological: She is alert.    ED Course  Procedures (including critical care time) Labs Review Labs Reviewed - No data to display  Imaging Review Dg Shoulder Right  12/20/2013   CLINICAL DATA:  Pain.  EXAM: RIGHT SHOULDER - 2+ VIEW  COMPARISON:  None.  FINDINGS: Degenerative changes right shoulder. No acute fracture or dislocation. No out separation.  IMPRESSION: DJD, no acute abnormality.   Electronically Signed   By: Marcello Moores  Register   On: 12/20/2013 10:26     EKG Interpretation None      MDM   Final diagnoses:  Right shoulder pain   Differential diagnosis includes bursitis, rotator strain, other musculoskeletal strain. No injury or concern for cardiac. Plan for screening x-ray and ibuprofen. Discussed supportive care and limiting repetitive motion at work for a week. Followup outpatient discussed. Blood pressure high in ER. Likely a component of underlying high blood pressure and pain. Patient  did not take her blood pressure meds this morning. Discussed risks and benefits of uncontrolled high blood pressure and strict followup with primary care provider for recheck and possible medicine adjustment. No clinical concern for end organ damage at this time. Right shoulder xray reviewed no acute findings or fracture. Results and differential diagnosis were discussed with the patient. Close follow up outpatient was  discussed, patient comfortable with the plan.   Filed Vitals:   12/20/13 0855 12/20/13 1020  BP: 157/133 174/90  Pulse: 71 64  Temp: 98.9 F (37.2 C)   TempSrc: Oral   Resp: 18 18  SpO2: 100% 98%       Mariea Clonts, MD 12/20/13 1046

## 2014-06-01 ENCOUNTER — Other Ambulatory Visit (HOSPITAL_COMMUNITY): Payer: Self-pay | Admitting: Pediatrics

## 2014-06-01 DIAGNOSIS — E049 Nontoxic goiter, unspecified: Secondary | ICD-10-CM

## 2014-06-03 DIAGNOSIS — E559 Vitamin D deficiency, unspecified: Secondary | ICD-10-CM | POA: Insufficient documentation

## 2014-06-03 HISTORY — DX: Vitamin D deficiency, unspecified: E55.9

## 2014-06-08 ENCOUNTER — Ambulatory Visit (HOSPITAL_COMMUNITY)

## 2014-07-08 ENCOUNTER — Emergency Department (HOSPITAL_COMMUNITY)
Admission: EM | Admit: 2014-07-08 | Discharge: 2014-07-08 | Disposition: A | Discharge status: Home / Self Care | Attending: Emergency Medicine | Admitting: Emergency Medicine

## 2014-07-08 ENCOUNTER — Encounter (HOSPITAL_COMMUNITY): Payer: Self-pay | Admitting: *Deleted

## 2014-07-08 DIAGNOSIS — Z79899 Other long term (current) drug therapy: Secondary | ICD-10-CM | POA: Insufficient documentation

## 2014-07-08 DIAGNOSIS — M25512 Pain in left shoulder: Secondary | ICD-10-CM

## 2014-07-08 DIAGNOSIS — K219 Gastro-esophageal reflux disease without esophagitis: Secondary | ICD-10-CM | POA: Insufficient documentation

## 2014-07-08 DIAGNOSIS — I10 Essential (primary) hypertension: Secondary | ICD-10-CM | POA: Insufficient documentation

## 2014-07-08 DIAGNOSIS — M542 Cervicalgia: Secondary | ICD-10-CM | POA: Diagnosis not present

## 2014-07-08 MED ORDER — KETOROLAC TROMETHAMINE 60 MG/2ML IM SOLN
30.0000 mg | Freq: Once | INTRAMUSCULAR | Status: AC
  Administered 2014-07-08: 30 mg via INTRAMUSCULAR
  Filled 2014-07-08: qty 2

## 2014-07-08 MED ORDER — NAPROXEN 500 MG PO TABS: 500.0000 mg | ORAL_TABLET | Freq: Two times a day (BID) | ORAL | Status: DC

## 2014-07-08 MED ORDER — METHOCARBAMOL 500 MG PO TABS: 500.0000 mg | ORAL_TABLET | Freq: Two times a day (BID) | ORAL | Status: DC | PRN

## 2014-07-08 NOTE — ED Notes (Signed)
Pt c/o L shoulder and L neck pain x 1 month.  Pain is worse in the am and when she is taking off her shirt/brushing hair.

## 2014-07-08 NOTE — ED Provider Notes (Signed)
CSN: [id number]     Arrival date & time 07/08/14  1235 History  This chart was scribed for non-physician practitioner, Will Porfirio Mylar, PA-C, working with Quintella Reichert, MD, by Stephania Fragmin, ED Scribe. This patient was seen in room TR07C/TR07C and the patient's care was started at 1:17 PM.    Chief Complaint  Patient presents with  . Shoulder Pain   The history is provided by the patient. No language interpreter was used.    HPI Comments: Jane [name] is a 58 y.o. female who presents to the Emergency Department complaining of 8/10, tight left shoulder pain that feels like a knot and radiates up her neck when turning her head to the right. She reports tightness to neck muscles but no stiffness.She says that her shoulder pain feels worse when laying down to sleep.  She has tried Aleve before bed that relieved her pain somewhat. She has tried a muscle relaxer for a separate case of back pain before that has given her relief. No trauma to her shoulder. She denies previous injury to her left shoulder.  Patient denies fever, chills, weakness, numbness, tingling, rashes, trauma to shoulder, chest pain, SOB, abdominal pain, nausea, vomiting, diarrhea, and constipation. Patient does not have DM. She has NKDA. 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     Past Medical History  Diagnosis Date  . Hypertension   . Reflux    Past Surgical History  Procedure Laterality Date  . Tubal ligation    . Colonoscopy     No family history on file. History  Substance Use Topics  . Smoking status: Never Smoker   . Smokeless tobacco: Not on file  . Alcohol Use: No   OB History    No data available     Review of Systems  Constitutional: Negative for fever and chills.  HENT: Negative for congestion and sore throat.   Eyes: Negative for visual disturbance.  Respiratory: Negative for cough, shortness of breath and wheezing.    Cardiovascular: Negative for chest pain and palpitations.  Gastrointestinal: Negative for nausea, vomiting, abdominal pain, diarrhea and constipation.  Genitourinary: Negative for dysuria.  Musculoskeletal: Positive for neck pain. Negative for myalgias, back pain, joint swelling, arthralgias, gait problem and neck stiffness.       Pain in left shoulder that radiates to neck.  Skin: Negative for pallor and rash.  Neurological: Negative for dizziness, weakness, light-headedness, numbness and headaches.  All other systems reviewed and are negative.     Allergies  Review of patient's allergies indicates no known allergies.  Home Medications   Prior to Admission medications   Medication Sig Start Date End Date Taking? Authorizing Provider  Cholecalciferol (VITAMIN D-3 PO) Take 1 tablet by mouth daily.    Historical Provider, MD  lansoprazole (PREVACID) 15 MG capsule Take 15 mg by mouth daily.    Historical Provider, MD  methocarbamol (ROBAXIN) 500 MG tablet Take 1 tablet (500 mg total) by mouth 2 (two) times daily as needed for muscle spasms. 07/08/14   Verda Cumins Icess Bertoni, PA-C  naproxen (NAPROSYN) 500 MG tablet Take 1 tablet (500 mg total) by mouth 2 (two) times daily with a meal. 07/08/14   Verda Cumins Elieser Tetrick, PA-C  VITAMIN E PO Take 1 capsule by mouth daily.    Historical Provider, MD   BP 173/98 mmHg  Pulse 66  Temp(Src) 98.3 F (36.8 C) (Oral)  Resp 18  SpO2 98% Physical Exam  Constitutional: She is oriented to person, place, and time. She appears well-developed and well-nourished. No distress.  HENT:  Head: Normocephalic and atraumatic.  Right Ear: External ear normal.  Left Ear: External ear normal.  Mouth/Throat: Oropharynx is clear and moist. No oropharyngeal exudate.  Eyes: Conjunctivae are normal. Pupils are equal, round, and reactive to light. Right eye exhibits no discharge. Left eye exhibits no discharge.  Neck: Normal range of motion. Neck supple. No tracheal  deviation present.  Patient has pain to palpation over her left trapezius muscle. Patient has full range of motion of her neck without difficulty. She has no cervical lymphadenopathy.  Cardiovascular: Normal rate, regular rhythm, normal heart sounds and intact distal pulses.  Exam reveals no gallop and no friction rub.   No murmur heard. Bilateral radial pulses are intact.  Pulmonary/Chest: Effort normal and breath sounds normal. No respiratory distress. She has no wheezes. She has no rales.  Abdominal: Soft. There is no tenderness.  Musculoskeletal: Normal range of motion. She exhibits no edema.  Patient has pain over her left trapezius muscle. The patient has no bony point tenderness. Patient has full range of motion of her left shoulder. Patient has no popping or clicking with movement of her left shoulder. There is no deformity of her left shoulder. There is no edema, ecchymosis or erythema noted to her left shoulder. She Has 5 out of 5 strength in her bilateral upper extremities. Equal grip strengths. Sensation is intact in her bilateral upper extremities.  Lymphadenopathy:    She has no cervical adenopathy.  Neurological: She is alert and oriented to person, place, and time. Coordination normal.  Skin: Skin is warm and dry. She is not diaphoretic.  Psychiatric: She has a normal mood and affect. Her behavior is normal.  Nursing note and vitals reviewed.   ED Course  Procedures (including critical care time)  DIAGNOSTIC STUDIES: Oxygen Saturation is 99% on RA, normal by my interpretation.    COORDINATION OF CARE: 1:27 PM - Discussed treatment plan with pt at bedside which includes NSAID and muscle relaxer, single shot for pain relief, and advice to follow up with PCP and pt agreed to plan.  I discussed that I don't believe an XR will reveal anything, and she decides not to have one.    Labs Review Labs Reviewed - No data to display  Imaging Review No results found.   EKG  Interpretation None      Filed Vitals:   07/08/14 1254 07/08/14 1341  BP: 158/80 173/98  Pulse: 58 66  Temp: 97.9 F (36.6 C) 98.3 F (36.8 C)  TempSrc: Oral Oral  Resp: 22 18  SpO2: 99% 98%     MDM   Meds given in ED:  Medications  ketorolac (TORADOL) injection 30 mg (30 mg Intramuscular Given 07/08/14 1338)    Discharge Medication List as of 07/08/2014  1:34 PM    START taking these medications   Details  methocarbamol (ROBAXIN) 500 MG tablet Take 1 tablet (500 mg total) by mouth 2 (two) times daily as needed for muscle spasms., Starting 07/08/2014, Until Discontinued, Print    naproxen (NAPROSYN) 500 MG tablet Take 1 tablet (500 mg total) by mouth 2 (two) times daily with a meal., Starting 07/08/2014, Until Discontinued, Print        Final diagnoses:  Shoulder pain, left   Jane [name] is a 58 y.o. female who presents to the Emergency Department complaining of 8/10, tight left  shoulder pain that feels like a knot and radiates up her neck when turning her head to the right. Patient has pain to her left trapezius muscle. Patient has no bony point tenderness. Patient has full range of motion of her bilateral upper extremities. Patient has full range of motion of her neck. Patient has no neck stiffness. There is no deformity or edema noted. There is no history of trauma to her left shoulder. Imaging is not necessary at this time. The patient is agreement with no need for imaging at this time. Patient was given Toradol IM for pain. The patient was discharged with prescription for Naprosyn and Robaxin. I advised her to use caution using Robaxin as it can cause drowsiness. Advised patient to follow-up with her primary care provider in the next 3 days. Advised to use shoulder exercises.  Advised to return to the ED with new worsening symptoms or new concerns. The patient verbalized understanding and agreement.  The patient was discussed with Monico Blitz PA agrees with  assessment and plan.  I personally performed the services described in this documentation, which was scribed in my presence. The recorded information has been reviewed and is accurate.         Hanley Hays, PA-C 07/08/14 Belle Rive, MD 07/09/14 [phone]

## 2014-07-08 NOTE — Discharge Instructions (Signed)
Muscle Pain  Muscle pain (myalgia) may be caused by many things, including:   Overuse or muscle strain, especially if you are not in shape. This is the most common cause of muscle pain.   Injury.   Bruises.   Viruses, such as the flu.   Infectious diseases.   Fibromyalgia, which is a chronic condition that causes muscle tenderness, fatigue, and headache.   Autoimmune diseases, including lupus.   Certain drugs, including ACE inhibitors and statins.  Muscle pain may be mild or severe. In most cases, the pain lasts only a short time and goes away without treatment. To diagnose the cause of your muscle pain, your health care provider will take your medical history. This means he or she will ask you when your muscle pain began and what has been happening. If you have not had muscle pain for very long, your health care provider may want to wait before doing much testing. If your muscle pain has lasted a long time, your health care provider may want to run tests right away. If your health care provider thinks your muscle pain may be caused by illness, you may need to have additional tests to rule out certain conditions.   Treatment for muscle pain depends on the cause. Home care is often enough to relieve muscle pain. Your health care provider may also prescribe anti-inflammatory medicine.  HOME CARE INSTRUCTIONS  Watch your condition for any changes. The following actions may help to lessen any discomfort you are feeling:   Only take over-the-counter or prescription medicines as directed by your health care provider.   Apply ice to the sore muscle:   Put ice in a plastic bag.   Place a towel between your skin and the bag.   Leave the ice on for 15-20 minutes, 3-4 times a day.   You may alternate applying hot and cold packs to the muscle as directed by your health care provider.   If overuse is causing your muscle pain, slow down your activities until the pain goes away.   Remember that it is normal to feel  some muscle pain after starting a workout program. Muscles that have not been used often will be sore at first.   Do regular, gentle exercises if you are not usually active.   Warm up before exercising to lower your risk of muscle pain.   Do not continue working out if the pain is very bad. Bad pain could mean you have injured a muscle.  SEEK MEDICAL CARE IF:   Your muscle pain gets worse, and medicines do not help.   You have muscle pain that lasts longer than 3 days.   You have a rash or fever along with muscle pain.   You have muscle pain after a tick bite.   You have muscle pain while working out, even though you are in good physical condition.   You have redness, soreness, or swelling along with muscle pain.   You have muscle pain after starting a new medicine or changing the dose of a medicine.  SEEK IMMEDIATE MEDICAL CARE IF:   You have trouble breathing.   You have trouble swallowing.   You have muscle pain along with a stiff neck, fever, and vomiting.   You have severe muscle weakness or cannot move part of your body.  MAKE SURE YOU:    Understand these instructions.   Will watch your condition.   Will get help right away if you are not   questions you have with your health care provider. Shoulder Pain The shoulder is the joint that connects your arms to your body. The bones that form the shoulder joint include the upper arm bone (humerus), the shoulder blade (scapula), and the collarbone (clavicle). The top of the humerus is shaped like a ball and fits into a rather flat socket on the scapula (glenoid cavity). A combination of muscles and strong, fibrous tissues that connect muscles to bones (tendons)  support your shoulder joint and hold the ball in the socket. Small, fluid-filled sacs (bursae) are located in different areas of the joint. They act as cushions between the bones and the overlying soft tissues and help reduce friction between the gliding tendons and the bone as you move your arm. Your shoulder joint allows a wide range of motion in your arm. This range of motion allows you to do things like scratch your back or throw a ball. However, this range of motion also makes your shoulder more prone to pain from overuse and injury. Causes of shoulder pain can originate from both injury and overuse and usually can be grouped in the following four categories:  Redness, swelling, and pain (inflammation) of the tendon (tendinitis) or the bursae (bursitis).  Instability, such as a dislocation of the joint.  Inflammation of the joint (arthritis).  Broken bone (fracture). HOME CARE INSTRUCTIONS   Apply ice to the sore area.  Put ice in a plastic bag.  Place a towel between your skin and the bag.  Leave the ice on for 15-20 minutes, 3-4 times per day for the first 2 days, or as directed by your health care provider.  Stop using cold packs if they do not help with the pain.  If you have a shoulder sling or immobilizer, wear it as long as your caregiver instructs. Only remove it to shower or bathe. Move your arm as little as possible, but keep your hand moving to prevent swelling.  Squeeze a soft ball or foam pad as much as possible to help prevent swelling.  Only take over-the-counter or prescription medicines for pain, discomfort, or fever as directed by your caregiver. SEEK MEDICAL CARE IF:   Your shoulder pain increases, or new pain develops in your arm, hand, or fingers.  Your hand or fingers become cold and numb.  Your pain is not relieved with medicines. SEEK IMMEDIATE MEDICAL CARE IF:   Your arm, hand, or fingers are numb or tingling.  Your arm, hand, or fingers are  significantly swollen or turn white or blue. MAKE SURE YOU:   Understand these instructions.  Will watch your condition.  Will get help right away if you are not doing well or get worse. Document Released: 05/16/2005 Document Revised: 12/21/2013 Document Reviewed: 07/21/2011 [facility] Patient Information 2015 [place], [place]. This information is not intended to replace advice given to you by your health care provider. Make sure you discuss any questions you have with your health care provider.

## 2015-04-09 ENCOUNTER — Emergency Department (HOSPITAL_COMMUNITY)

## 2015-04-09 ENCOUNTER — Encounter (HOSPITAL_COMMUNITY): Payer: Self-pay | Admitting: *Deleted

## 2015-04-09 ENCOUNTER — Emergency Department (HOSPITAL_COMMUNITY)
Admission: EM | Admit: 2015-04-09 | Discharge: 2015-04-09 | Disposition: A | Discharge status: Home / Self Care | Attending: Emergency Medicine | Admitting: Emergency Medicine

## 2015-04-09 DIAGNOSIS — I10 Essential (primary) hypertension: Secondary | ICD-10-CM | POA: Insufficient documentation

## 2015-04-09 DIAGNOSIS — Z79899 Other long term (current) drug therapy: Secondary | ICD-10-CM | POA: Diagnosis not present

## 2015-04-09 DIAGNOSIS — K219 Gastro-esophageal reflux disease without esophagitis: Secondary | ICD-10-CM | POA: Diagnosis not present

## 2015-04-09 DIAGNOSIS — R42 Dizziness and giddiness: Secondary | ICD-10-CM | POA: Insufficient documentation

## 2015-04-09 DIAGNOSIS — R109 Unspecified abdominal pain: Secondary | ICD-10-CM | POA: Diagnosis present

## 2015-04-09 DIAGNOSIS — R1013 Epigastric pain: Secondary | ICD-10-CM

## 2015-04-09 LAB — BASIC METABOLIC PANEL
ANION GAP: 11 (ref 5–15)
BUN: 11 mg/dL (ref 6–20)
CALCIUM: 9.5 mg/dL (ref 8.9–10.3)
CO2: 26 mmol/L (ref 22–32)
Chloride: 101 mmol/L (ref 101–111)
Creatinine, Ser: 0.73 mg/dL (ref 0.44–1.00)
Glucose, Bld: 94 mg/dL (ref 65–99)
Potassium: 3.5 mmol/L (ref 3.5–5.1)
Sodium: 138 mmol/L (ref 135–145)

## 2015-04-09 LAB — URINALYSIS, ROUTINE W REFLEX MICROSCOPIC
Bilirubin Urine: NEGATIVE
Glucose, UA: NEGATIVE mg/dL
HGB URINE DIPSTICK: NEGATIVE
Ketones, ur: NEGATIVE mg/dL
LEUKOCYTES UA: NEGATIVE
NITRITE: NEGATIVE
PROTEIN: NEGATIVE mg/dL
Specific Gravity, Urine: 1.003 — ABNORMAL LOW (ref 1.005–1.030)
UROBILINOGEN UA: 0.2 mg/dL (ref 0.0–1.0)
pH: 7.5 (ref 5.0–8.0)

## 2015-04-09 LAB — HEPATIC FUNCTION PANEL
ALBUMIN: 4 g/dL (ref 3.5–5.0)
ALK PHOS: 101 U/L (ref 38–126)
ALT: 30 U/L (ref 14–54)
AST: 35 U/L (ref 15–41)
BILIRUBIN TOTAL: 0.7 mg/dL (ref 0.3–1.2)
Bilirubin, Direct: <0.1 mg/dL — ABNORMAL LOW (ref 0.1–0.5)
Total Protein: 7.8 g/dL (ref 6.5–8.1)

## 2015-04-09 LAB — CBC
HCT: 38.2 % (ref 36.0–46.0)
HEMOGLOBIN: 12.9 g/dL (ref 12.0–15.0)
MCH: 30.5 pg (ref 26.0–34.0)
MCHC: 33.8 g/dL (ref 30.0–36.0)
MCV: 90.3 fL (ref 78.0–100.0)
Platelets: 209 10*3/uL (ref 150–400)
RBC: 4.23 MIL/uL (ref 3.87–5.11)
RDW: 12.8 % (ref 11.5–15.5)
WBC: 7.8 10*3/uL (ref 4.0–10.5)

## 2015-04-09 LAB — TROPONIN I: Troponin I: <0.03 ng/mL (ref <0.031)

## 2015-04-09 LAB — LIPASE, BLOOD: Lipase: 19 U/L — ABNORMAL LOW (ref 22–51)

## 2015-04-09 MED ORDER — GI COCKTAIL ~~LOC~~
30.0000 mL | Freq: Once | ORAL | Status: AC
  Administered 2015-04-09: 30 mL via ORAL
  Filled 2015-04-09: qty 30

## 2015-04-09 NOTE — Discharge Instructions (Signed)
Abdominal Pain Many things can cause abdominal pain. Usually, abdominal pain is not caused by a disease and will improve without treatment. It can often be observed and treated at home. Your health care provider will do a physical exam and possibly order blood tests and X-rays to help determine the seriousness of your pain. However, in many cases, more time must pass before a clear cause of the pain can be found. Before that point, your health care provider may not know if you need more testing or further treatment. HOME CARE INSTRUCTIONS  Monitor your abdominal pain for any changes. The following actions may help to alleviate any discomfort you are experiencing:  Only take over-the-counter or prescription medicines as directed by your health care provider.  Do not take laxatives unless directed to do so by your health care provider.  Try a clear liquid diet (broth, tea, or water) as directed by your health care provider. Slowly move to a bland diet as tolerated. SEEK MEDICAL CARE IF:  You have unexplained abdominal pain.  You have abdominal pain associated with nausea or diarrhea.  You have pain when you urinate or have a bowel movement.  You experience abdominal pain that wakes you in the night.  You have abdominal pain that is worsened or improved by eating food.  You have abdominal pain that is worsened with eating fatty foods.  You have a fever. SEEK IMMEDIATE MEDICAL CARE IF:   Your pain does not go away within 2 hours.  You keep throwing up (vomiting).  Your pain is felt only in portions of the abdomen, such as the right side or the left lower portion of the abdomen.  You pass bloody or black tarry stools. MAKE SURE YOU:  Understand these instructions.   Will watch your condition.   Will get help right away if you are not doing well or get worse.  Document Released: 05/16/2005 Document Revised: 08/11/2013 Document Reviewed: 04/15/2013 [facility] Patient Information  2015 [place], [place]. This information is not intended to replace advice given to you by your health care provider. Make sure you discuss any questions you have with your health care provider. Gastroesophageal Reflux Disease, Adult Gastroesophageal reflux disease (GERD) happens when acid from your stomach flows up into the esophagus. When acid comes in contact with the esophagus, the acid causes soreness (inflammation) in the esophagus. Over time, GERD may create small holes (ulcers) in the lining of the esophagus. CAUSES   Increased body weight. This puts pressure on the stomach, making acid rise from the stomach into the esophagus.  Smoking. This increases acid production in the stomach.  Drinking alcohol. This causes decreased pressure in the lower esophageal sphincter (valve or ring of muscle between the esophagus and stomach), allowing acid from the stomach into the esophagus.  Late evening meals and a full stomach. This increases pressure and acid production in the stomach.  A malformed lower esophageal sphincter. Sometimes, no cause is found. SYMPTOMS   Burning pain in the lower part of the mid-chest behind the breastbone and in the mid-stomach area. This may occur twice a week or more often.  Trouble swallowing.  Sore throat.  Dry cough.  Asthma-like symptoms including chest tightness, shortness of breath, or wheezing. DIAGNOSIS  Your caregiver may be able to diagnose GERD based on your symptoms. In some cases, X-rays and other tests may be done to check for complications or to check the condition of your stomach and esophagus. TREATMENT  Your caregiver may recommend  over-the-counter or prescription medicines to help decrease acid production. Ask your caregiver before starting or adding any new medicines.  HOME CARE INSTRUCTIONS   Change the factors that you can control. Ask your caregiver for guidance concerning weight loss, quitting smoking, and alcohol consumption.  Avoid  foods and drinks that make your symptoms worse, such as:  Caffeine or alcoholic drinks.  Chocolate.  Peppermint or mint flavorings.  Garlic and onions.  Spicy foods.  Citrus fruits, such as oranges, lemons, or limes.  Tomato-based foods such as sauce, chili, salsa, and pizza.  Fried and fatty foods.  Avoid lying down for the 3 hours prior to your bedtime or prior to taking a nap.  Eat small, frequent meals instead of large meals.  Wear loose-fitting clothing. Do not wear anything tight around your waist that causes pressure on your stomach.  Raise the head of your bed 6 to 8 inches with wood blocks to help you sleep. Extra pillows will not help.  Only take over-the-counter or prescription medicines for pain, discomfort, or fever as directed by your caregiver.  Do not take aspirin, ibuprofen, or other nonsteroidal anti-inflammatory drugs (NSAIDs). SEEK IMMEDIATE MEDICAL CARE IF:   You have pain in your arms, neck, jaw, teeth, or back.  Your pain increases or changes in intensity or duration.  You develop nausea, vomiting, or sweating (diaphoresis).  You develop shortness of breath, or you faint.  Your vomit is green, yellow, black, or looks like coffee grounds or blood.  Your stool is red, bloody, or black. These symptoms could be signs of other problems, such as heart disease, gastric bleeding, or esophageal bleeding. MAKE SURE YOU:   Understand these instructions.  Will watch your condition.  Will get help right away if you are not doing well or get worse. Document Released: 05/16/2005 Document Revised: 10/29/2011 Document Reviewed: 02/23/2011 [facility] Patient Information 2015 [place], [place]. This information is not intended to replace advice given to you by your health care provider. Make sure you discuss any questions you have with your health care provider. You have had a finding on your chest x-ray that the radiologist recommends a follow-up exam on. Make  your doctor aware of this and they can repeat her chest x-ray and perform other diagnostic studies if needed.

## 2015-04-09 NOTE — ED Provider Notes (Signed)
CSN: [id number]     Arrival date & time 04/09/15  1529 History   First MD Initiated Contact with Patient 04/09/15 1606     Chief Complaint  Patient presents with  . Dizziness  . Abdominal Pain     (Consider location/radiation/quality/duration/timing/severity/associated sxs/prior Treatment) HPI Patient states that she felt fine at the end of the week and then Friday night she ate at [employer]. She reports that she felt a little bloated and then this morning woke up not feeling well. She reports she feels a little dizzy, in the sense that it's more of fatigue as if she needs to rest. No syncope or near syncope. She reports she made herself vomit once this morning because of her nausea, there was no blood present. She denies any diarrhea. The patient poor she tends to have constipation. Her main symptom is epigastric fullness. She reports she has a history of reflux and this does feel similar. Past Medical History  Diagnosis Date  . Hypertension   . Reflux    Past Surgical History  Procedure Laterality Date  . Tubal ligation    . Colonoscopy     No family history on file. Social History  Substance Use Topics  . Smoking status: Never Smoker   . Smokeless tobacco: None  . Alcohol Use: No   OB History    No data available     Review of Systems 10 Systems reviewed and are negative for acute change except as noted in the HPI.    Allergies  Review of patient's allergies indicates no known allergies.  Home Medications   Prior to Admission medications   Medication Sig Start Date End Date Taking? Authorizing Provider  Cholecalciferol (VITAMIN D-3 PO) Take 1 tablet by mouth daily.   Yes Historical Provider, MD  hydrochlorothiazide (HYDRODIURIL) 25 MG tablet Take 25 mg by mouth daily.   Yes Historical Provider, MD  lansoprazole (PREVACID) 15 MG capsule Take 15 mg by mouth as needed (heartburn).    Yes Historical Provider, MD  Naproxen Sodium (ALEVE) 220 MG CAPS Take 220 mg by mouth  daily as needed.   Yes Historical Provider, MD  VITAMIN E PO Take 1 capsule by mouth daily.   Yes Historical Provider, MD  methocarbamol (ROBAXIN) 500 MG tablet Take 1 tablet (500 mg total) by mouth 2 (two) times daily as needed for muscle spasms. 07/08/14   Waynetta Pean, PA-C  naproxen (NAPROSYN) 500 MG tablet Take 1 tablet (500 mg total) by mouth 2 (two) times daily with a meal. 07/08/14   Waynetta Pean, PA-C   BP 153/73 mmHg  Pulse 73  Temp(Src) 97.6 F (36.4 C) (Oral)  Resp 18  Ht 5' 4.5" (1.638 m)  Wt 231 lb (104.781 kg)  BMI 39.05 kg/m2  SpO2 100% Physical Exam  Constitutional: She is oriented to person, place, and time. She appears well-developed and well-nourished.  HENT:  Head: Normocephalic and atraumatic.  Eyes: EOM are normal. Pupils are equal, round, and reactive to light.  Neck: Neck supple.  Cardiovascular: Normal rate, regular rhythm, normal heart sounds and intact distal pulses.   Pulmonary/Chest: Effort normal and breath sounds normal.  Abdominal: Soft. Bowel sounds are normal. She exhibits no distension. There is no tenderness.  Musculoskeletal: Normal range of motion. She exhibits no edema.  Neurological: She is alert and oriented to person, place, and time. She has normal strength. Coordination normal. GCS eye subscore is 4. GCS verbal subscore is 5. GCS motor subscore is 6.  Skin: Skin is warm, dry and intact.  Psychiatric: She has a normal mood and affect.    ED Course  Procedures (including critical care time) Labs Review Labs Reviewed  URINALYSIS, ROUTINE W REFLEX MICROSCOPIC (NOT AT [facility]) - Abnormal; Notable for the following:    APPearance CLOUDY (*)    Specific Gravity, Urine 1.003 (*)    All other components within normal limits  LIPASE, BLOOD - Abnormal; Notable for the following:    Lipase 19 (*)    All other components within normal limits  HEPATIC FUNCTION PANEL - Abnormal; Notable for the following:    Bilirubin, Direct <0.1 (*)    All  other components within normal limits  BASIC METABOLIC PANEL  CBC  TROPONIN I    Imaging Review Dg Chest 2 View  04/09/2015   CLINICAL DATA:  Chest pain and discomfort. Cardiac flutter. EKG changes. Dizziness with nausea and vomiting.  EXAM: CHEST  2 VIEW  COMPARISON:  09/15/2013.  FINDINGS: Cardiopericardial silhouette within normal limits. Mediastinal contours normal. Trachea midline. No airspace disease or effusion.  There is a small nodular density on the frontal projection adjacent to the LEFT heart border that measures 7 mm. Pulmonary nodule cannot be excluded although this could represent summation artifacts.  IMPRESSION: 1. No active cardiopulmonary disease. 2. Possible LEFT basilar 7 mm pulmonary nodule. A repeat frontal view of the chest is recommended with monitoring buttons removed to reassess. If this density persists, Followup noncontrast chest CT recommended for further evaluation. This can be performed on a nonemergent basis.   Electronically Signed   By: Dereck Ligas M.D.   On: 04/09/2015 16:57   I have personally reviewed and evaluated these images and lab results as part of my medical decision-making.   EKG Interpretation   Date/Time:  Saturday April 09 2015 15:44:03 EDT Ventricular Rate:  85 PR Interval:  164 QRS Duration: 84 QT Interval:  362 QTC Calculation: 430 R Axis:   21 Text Interpretation:  Normal sinus rhythm Normal ECG agree Confirmed by  Johnney Killian, MD, Jeannie Done [phone]) on 04/09/2015 5:28:03 PM      MDM   Final diagnoses:  Epigastric pain  Gastroesophageal reflux disease without esophagitis   Patient's symptoms are consistent with GERD or possible biliary symptoms without cholecystitis. Predominantly the patient notes bloating sensation without severe pain. With these are normal and lipase is normal. Patient has a nonsurgical abdominal examination. She is taking her PPI predominantly on an as-needed basis. She is advised to take this daily and to  follow-up with her physician to see if this resolves symptoms. He is made aware of possible necessity of ultrasound and EGD if symptoms persist or change. Patient was also made aware of radiologist's recommendation for follow-up chest x-ray and CT for incidental pulmonary nodule identification. This is in her discharge instructions are marked and she voices understanding.    Charlesetta Shanks, MD 04/09/15 [phone]

## 2015-04-09 NOTE — ED Notes (Signed)
Pt states she woke up this am with dizziness and epigastric bloating.  States hx of GERD and she ate [employer] last night.

## 2015-05-15 ENCOUNTER — Encounter (HOSPITAL_COMMUNITY): Payer: Self-pay | Admitting: Nurse Practitioner

## 2015-05-15 ENCOUNTER — Emergency Department (HOSPITAL_COMMUNITY)
Admission: EM | Admit: 2015-05-15 | Discharge: 2015-05-15 | Disposition: A | Discharge status: Home / Self Care | Attending: Emergency Medicine | Admitting: Emergency Medicine

## 2015-05-15 DIAGNOSIS — T781XXA Other adverse food reactions, not elsewhere classified, initial encounter: Secondary | ICD-10-CM | POA: Insufficient documentation

## 2015-05-15 DIAGNOSIS — Y9289 Other specified places as the place of occurrence of the external cause: Secondary | ICD-10-CM | POA: Diagnosis not present

## 2015-05-15 DIAGNOSIS — X58XXXA Exposure to other specified factors, initial encounter: Secondary | ICD-10-CM | POA: Insufficient documentation

## 2015-05-15 DIAGNOSIS — Z91018 Allergy to other foods: Secondary | ICD-10-CM

## 2015-05-15 DIAGNOSIS — K219 Gastro-esophageal reflux disease without esophagitis: Secondary | ICD-10-CM | POA: Diagnosis not present

## 2015-05-15 DIAGNOSIS — L299 Pruritus, unspecified: Secondary | ICD-10-CM | POA: Diagnosis not present

## 2015-05-15 DIAGNOSIS — Y998 Other external cause status: Secondary | ICD-10-CM | POA: Insufficient documentation

## 2015-05-15 DIAGNOSIS — Y9389 Activity, other specified: Secondary | ICD-10-CM | POA: Insufficient documentation

## 2015-05-15 DIAGNOSIS — I10 Essential (primary) hypertension: Secondary | ICD-10-CM | POA: Insufficient documentation

## 2015-05-15 DIAGNOSIS — Z79899 Other long term (current) drug therapy: Secondary | ICD-10-CM | POA: Insufficient documentation

## 2015-05-15 MED ORDER — PREDNISONE 20 MG PO TABS
60.0000 mg | ORAL_TABLET | Freq: Once | ORAL | Status: AC
  Administered 2015-05-15: 60 mg via ORAL
  Filled 2015-05-15: qty 3

## 2015-05-15 MED ORDER — FAMOTIDINE 20 MG PO TABS
20.0000 mg | ORAL_TABLET | Freq: Once | ORAL | Status: AC
  Administered 2015-05-15: 20 mg via ORAL
  Filled 2015-05-15: qty 1

## 2015-05-15 MED ORDER — DIPHENHYDRAMINE HCL 25 MG PO CAPS
50.0000 mg | ORAL_CAPSULE | Freq: Once | ORAL | Status: AC
  Administered 2015-05-15: 50 mg via ORAL
  Filled 2015-05-15: qty 2

## 2015-05-15 NOTE — ED Notes (Addendum)
She began to have itchy hives to face and chest and feels her cheeks are swelling about an hour after eating fish, shrimp and cookies and taking her normal medications this afternoon. She denies food allergies in past. She is ab le to seapk in full sentences but states her throat is feeling tight.

## 2015-05-15 NOTE — ED Notes (Signed)
Pt is in stable condition upon d/c and ambulates from ED. 

## 2015-05-15 NOTE — ED Provider Notes (Signed)
CSN: [id number]     Arrival date & time 05/15/15  1534 History   First MD Initiated Contact with Patient 05/15/15 1556     Chief Complaint  Patient presents with  . Allergic Reaction     (Consider location/radiation/quality/duration/timing/severity/associated sxs/prior Treatment) HPI   Jane [name] is a 59 y.o. female who presents for evaluation of itching which started shortly after eating a lunch with fish and shrimp. She denies shortness of breath, weakness or dizziness. No prior food allergies. No other recent illnesses. There are no other known modifying factors.   Past Medical History  Diagnosis Date  . Hypertension   . Reflux    Past Surgical History  Procedure Laterality Date  . Tubal ligation    . Colonoscopy     History reviewed. No pertinent family history. Social History  Substance Use Topics  . Smoking status: Never Smoker   . Smokeless tobacco: None  . Alcohol Use: No   OB History    No data available     Review of Systems  All other systems reviewed and are negative.     Allergies  Review of patient's allergies indicates no known allergies.  Home Medications   Prior to Admission medications   Medication Sig Start Date End Date Taking? Authorizing Provider  Cholecalciferol (VITAMIN D-3 PO) Take 1 tablet by mouth daily.   Yes Historical Provider, MD  hydrochlorothiazide (HYDRODIURIL) 25 MG tablet Take 25 mg by mouth daily.   Yes Historical Provider, MD  lansoprazole (PREVACID) 15 MG capsule Take 15 mg by mouth as needed (heartburn).    Yes Historical Provider, MD  Naproxen Sodium (ALEVE) 220 MG CAPS Take 220 mg by mouth daily as needed.   Yes Historical Provider, MD  VITAMIN E PO Take 1 capsule by mouth daily.   Yes Historical Provider, MD  methocarbamol (ROBAXIN) 500 MG tablet Take 1 tablet (500 mg total) by mouth 2 (two) times daily as needed for muscle spasms. Patient not taking: Reported on 05/15/2015 07/08/14   Waynetta Pean, PA-C  naproxen  (NAPROSYN) 500 MG tablet Take 1 tablet (500 mg total) by mouth 2 (two) times daily with a meal. Patient not taking: Reported on 05/15/2015 07/08/14   Waynetta Pean, PA-C   BP 137/71 mmHg  Pulse 88  Temp(Src) 98.2 F (36.8 C) (Oral)  Resp 16  SpO2 99% Physical Exam  Constitutional: She is oriented to person, place, and time. She appears well-developed and well-nourished. No distress.  HENT:  Head: Normocephalic and atraumatic.  Right Ear: External ear normal.  Left Ear: External ear normal.  Eyes: Conjunctivae and EOM are normal. Pupils are equal, round, and reactive to light.  Neck: Normal range of motion and phonation normal. Neck supple.  Cardiovascular: Normal rate, regular rhythm and normal heart sounds.   Pulmonary/Chest: Effort normal and breath sounds normal. She exhibits no bony tenderness.  Abdominal: Soft. There is no tenderness.  Musculoskeletal: Normal range of motion.  Neurological: She is alert and oriented to person, place, and time. No cranial nerve deficit or sensory deficit. She exhibits normal muscle tone. Coordination normal.  Skin: Skin is warm, dry and intact.  Generalized red, raised urticarial rash.  Psychiatric: She has a normal mood and affect. Her behavior is normal. Judgment and thought content normal.  Nursing note and vitals reviewed.   ED Course  Procedures (including critical care time) Medications  famotidine (PEPCID) tablet 20 mg (20 mg Oral Given 05/15/15 1606)  diphenhydrAMINE (BENADRYL) capsule 50 mg (50  mg Oral Given 05/15/15 1606)  predniSONE (DELTASONE) tablet 60 mg (60 mg Oral Given 05/15/15 1606)    Patient Vitals for the past 24 hrs:  BP Temp Temp src Pulse Resp SpO2  05/15/15 1815 132/72 mmHg - - 62 - 97 %  05/15/15 1715 129/57 mmHg - - 70 - 99 %  05/15/15 1645 137/71 mmHg - - 88 - 99 %  05/15/15 1630 139/73 mmHg - - 88 - 97 %  05/15/15 1615 (!) 140/54 mmHg - - 99 - 97 %  05/15/15 1608 - - - - - 98 %  05/15/15 1546 121/88 mmHg 98.2  F (36.8 C) Oral 118 16 100 %    6:27 PM Reevaluation with update and discussion. After initial assessment and treatment, an updated evaluation reveals rash essentially resolved. She denies pruritus at this time. Findings discussed with the patient, all questions were answered. [place] Review Labs Reviewed - No data to display  Imaging Review No results found. I have personally reviewed and evaluated these images and lab results as part of my medical decision-making.   EKG Interpretation None      MDM   Final diagnoses:  Food allergy    Urticarial reaction, likely secondary to shrimp allergy. Patient plans on seeing her PCP to be checked for food allergy.  Nursing Notes Reviewed/ Care Coordinated Applicable Imaging Reviewed Interpretation of Laboratory Data incorporated into ED treatment  The patient appears reasonably screened and/or stabilized for discharge and I doubt any other medical condition or other [facility] requiring further screening, evaluation, or treatment in the ED at this time prior to discharge.  Plan: Home Medications- continue in histamines, when necessary; Home Treatments- rest, avoid shrimp; return here if the recommended treatment, does not improve the symptoms; Recommended follow up- PCP, when necessary     Daleen Bo, MD 05/15/15 1735

## 2015-05-15 NOTE — Discharge Instructions (Signed)
Use Benadryl 25 mg 4 times a day, and Pepcid 20 mg twice a day as needed for rash or itching. Return here if needed for problems.   Allergies Allergies may happen from anything your body is sensitive to. This may be food, medicines, pollens, chemicals, and nearly anything around you in everyday life that produces allergens. An allergen is anything that causes an allergy producing substance. Heredity is often a factor in causing these problems. This means you may have some of the same allergies as your parents. Food allergies happen in all age groups. Food allergies are some of the most severe and life threatening. Some common food allergies are cow's milk, seafood, eggs, nuts, wheat, and soybeans. SYMPTOMS   Swelling around the mouth.  An itchy red rash or hives.  Vomiting or diarrhea.  Difficulty breathing. SEVERE ALLERGIC REACTIONS ARE LIFE-THREATENING. This reaction is called anaphylaxis. It can cause the mouth and throat to swell and cause difficulty with breathing and swallowing. In severe reactions only a trace amount of food (for example, peanut oil in a salad) may cause death within seconds. Seasonal allergies occur in all age groups. These are seasonal because they usually occur during the same season every year. They may be a reaction to molds, grass pollens, or tree pollens. Other causes of problems are house dust mite allergens, pet dander, and mold spores. The symptoms often consist of nasal congestion, a runny itchy nose associated with sneezing, and tearing itchy eyes. There is often an associated itching of the mouth and ears. The problems happen when you come in contact with pollens and other allergens. Allergens are the particles in the air that the body reacts to with an allergic reaction. This causes you to release allergic antibodies. Through a chain of events, these eventually cause you to release histamine into the blood stream. Although it is meant to be protective to the  body, it is this release that causes your discomfort. This is why you were given anti-histamines to feel better. If you are unable to pinpoint the offending allergen, it may be determined by skin or blood testing. Allergies cannot be cured but can be controlled with medicine. Hay fever is a collection of all or some of the seasonal allergy problems. It may often be treated with simple over-the-counter medicine such as diphenhydramine. Take medicine as directed. Do not drink alcohol or drive while taking this medicine. Check with your caregiver or package insert for child dosages. If these medicines are not effective, there are many new medicines your caregiver can prescribe. Stronger medicine such as nasal spray, eye drops, and corticosteroids may be used if the first things you try do not work well. Other treatments such as immunotherapy or desensitizing injections can be used if all else fails. Follow up with your caregiver if problems continue. These seasonal allergies are usually not life threatening. They are generally more of a nuisance that can often be handled using medicine. HOME CARE INSTRUCTIONS   If unsure what causes a reaction, keep a diary of foods eaten and symptoms that follow. Avoid foods that cause reactions.  If hives or rash are present:  Take medicine as directed.  You may use an over-the-counter antihistamine (diphenhydramine) for hives and itching as needed.  Apply cold compresses (cloths) to the skin or take baths in cool water. Avoid hot baths or showers. Heat will make a rash and itching worse.  If you are severely allergic:  Following a treatment for a severe reaction, hospitalization  is often required for closer follow-up.  Wear a medic-alert bracelet or necklace stating the allergy.  You and your family must learn how to give adrenaline or use an anaphylaxis kit.  If you have had a severe reaction, always carry your anaphylaxis kit or EpiPen with you. Use this  medicine as directed by your caregiver if a severe reaction is occurring. Failure to do so could have a fatal outcome. SEEK MEDICAL CARE IF:  You suspect a food allergy. Symptoms generally happen within 30 minutes of eating a food.  Your symptoms have not gone away within 2 days or are getting worse.  You develop new symptoms.  You want to retest yourself or your child with a food or drink you think causes an allergic reaction. Never do this if an anaphylactic reaction to that food or drink has happened before. Only do this under the care of a caregiver. SEEK IMMEDIATE MEDICAL CARE IF:   You have difficulty breathing, are wheezing, or have a tight feeling in your chest or throat.  You have a swollen mouth, or you have hives, swelling, or itching all over your body.  You have had a severe reaction that has responded to your anaphylaxis kit or an EpiPen. These reactions may return when the medicine has worn off. These reactions should be considered life threatening. MAKE SURE YOU:   Understand these instructions.  Will watch your condition.  Will get help right away if you are not doing well or get worse. Document Released: 10/30/2002 Document Revised: 12/01/2012 Document Reviewed: 04/05/2008 [facility] Patient Information 2015 [place], [place]. This information is not intended to replace advice given to you by your health care provider. Make sure you discuss any questions you have with your health care provider.

## 2015-08-03 ENCOUNTER — Encounter (HOSPITAL_COMMUNITY): Payer: Self-pay | Admitting: Emergency Medicine

## 2015-08-03 ENCOUNTER — Emergency Department (INDEPENDENT_AMBULATORY_CARE_PROVIDER_SITE_OTHER)
Admission: EM | Admit: 2015-08-03 | Discharge: 2015-08-03 | Disposition: A | Discharge status: Home / Self Care | Source: Home / Self Care

## 2015-08-03 DIAGNOSIS — H04121 Dry eye syndrome of right lacrimal gland: Secondary | ICD-10-CM | POA: Diagnosis not present

## 2015-08-03 NOTE — ED Notes (Signed)
C/o right eye swelling since last friday States she has red, itches, watery, crust lid Hx of dry eyes Denies any blurry vision

## 2015-08-03 NOTE — Discharge Instructions (Signed)
Artificial Tears eye solution   What is this medicine?   ARTIFICIAL TEARS (ahr tuh FISH uhl teerz) eye solution soothes irritation and discomfort caused by dry eyes.   This medicine may be used for other purposes; ask your health care provider or pharmacist if you have questions.   What should I tell my health care provider before I take this medicine?   -change in vision   -eye infection or trauma   -wear contact lenses   -an unusual or allergic reaction to artificial tears, other medicines, foods, dyes, or preservatives   -pregnant or trying to get pregnant   -breast-feeding   How should I use this medicine?   This medicine is only for use in the eye. Do not take by mouth. Follow the directions on the label. Wash hands before and after use. Tilt the head back slightly and pull down the lower eyelid with your index finger to form a pouch. Try not to touch the tip of the dropper to your eye, fingertips, or any other surface. Squeeze the prescribed number of drops (usually one or two drops) into the pouch. Close the eye gently for a few moments to allow the drops to be in contact with the eye. Use your medicine at regular intervals. Do not use your medicine more often than directed.   Talk to your pediatrician regarding the use of this medicine in children. While this medicine may be used in children as young as 6 years for selected conditions, precautions do apply.   Overdosage: If you think you have taken too much of this medicine contact a poison control center or emergency room at once.   NOTE: This medicine is only for you. Do not share this medicine with others.   What if I miss a dose?   If you miss a dose, use it as soon as you can. If it is almost time for your next dose, use only that dose. Do not use double or extra doses.   What may interact with this medicine?   Interactions are not expected. If you are using other eye drops with this medicine, separate the application of the different eye drops by  roughly 5 minutes. This ensures that the eye drops do not interfere with each other. If you are using both eye drops and an eye ointment, use the eye drops 10 minutes before the eye ointment so that the eye ointment does not interfere with the action of the drops.   This list may not describe all possible interactions. Give your health care provider a list of all the medicines, herbs, non-prescription drugs, or dietary supplements you use. Also tell them if you smoke, drink alcohol, or use illegal drugs. Some items may interact with your medicine.   What should I watch for while using this medicine?   If you experience eye pain, changes in vision, continued redness or irritation of the eye, or if your eye condition gets worse or lasts longer than 72 hours, discontinue use and consult your health care professional.   To avoid contamination of this product, do not touch the tip of the container to any surface. Do not share this medicine with others. If the product changes color or becomes cloudy, do not use.   If you wear contact lenses, you should remove them before putting the drops in your eyes. Wait at least 15 minutes after putting the drops in your eyes before putting your contact lenses back in.     What side effects may I notice from receiving this medicine?   Side effects that you should report to your doctor or health care professional as soon as possible:   -allergic reactions like skin rash, itching or hives, swelling of the face, lips, or tongue   -change in vision   -eye irritation or redness that gets worse or lasts more than 72 hours   -eye pain   Side effects that usually do not require medical attention (report to your doctor or health care professional if they continue or are bothersome):   -temporary stinging or blurred vision when applying the eye drops   This list may not describe all possible side effects. Call your doctor for medical advice about side effects. You may report side effects to FDA  at 1-800-FDA-1088.   Where should I keep my medicine?   Keep out of the reach of children.   Store at room temperature between 15 and 30 degrees C (59 and 86 degrees F). Do not freeze. Throw away any unused medicine after the expiration date. Once the product is opened, most experts recommend discarding the product after 30 days.   NOTE: This sheet is a summary. It may not cover all possible information. If you have questions about this medicine, talk to your doctor, pharmacist, or health care provider.    2016, [employer]. (2008-02-06 14:24:03)

## 2015-08-21 ENCOUNTER — Emergency Department (HOSPITAL_COMMUNITY)
Admission: EM | Admit: 2015-08-21 | Discharge: 2015-08-21 | Disposition: A | Discharge status: Home / Self Care | Payer: No Typology Code available for payment source | Attending: Emergency Medicine | Admitting: Emergency Medicine

## 2015-08-21 ENCOUNTER — Emergency Department (HOSPITAL_COMMUNITY): Payer: No Typology Code available for payment source

## 2015-08-21 ENCOUNTER — Encounter (HOSPITAL_COMMUNITY): Payer: Self-pay | Admitting: Emergency Medicine

## 2015-08-21 DIAGNOSIS — S301XXA Contusion of abdominal wall, initial encounter: Secondary | ICD-10-CM

## 2015-08-21 DIAGNOSIS — S32009A Unspecified fracture of unspecified lumbar vertebra, initial encounter for closed fracture: Secondary | ICD-10-CM | POA: Diagnosis not present

## 2015-08-21 DIAGNOSIS — Y92410 Unspecified street and highway as the place of occurrence of the external cause: Secondary | ICD-10-CM | POA: Diagnosis not present

## 2015-08-21 DIAGNOSIS — Z79899 Other long term (current) drug therapy: Secondary | ICD-10-CM | POA: Insufficient documentation

## 2015-08-21 DIAGNOSIS — F419 Anxiety disorder, unspecified: Secondary | ICD-10-CM | POA: Diagnosis not present

## 2015-08-21 DIAGNOSIS — K219 Gastro-esophageal reflux disease without esophagitis: Secondary | ICD-10-CM | POA: Diagnosis not present

## 2015-08-21 DIAGNOSIS — Y9389 Activity, other specified: Secondary | ICD-10-CM | POA: Diagnosis not present

## 2015-08-21 DIAGNOSIS — I10 Essential (primary) hypertension: Secondary | ICD-10-CM | POA: Diagnosis not present

## 2015-08-21 DIAGNOSIS — Y998 Other external cause status: Secondary | ICD-10-CM | POA: Diagnosis not present

## 2015-08-21 DIAGNOSIS — S29001A Unspecified injury of muscle and tendon of front wall of thorax, initial encounter: Secondary | ICD-10-CM | POA: Diagnosis not present

## 2015-08-21 DIAGNOSIS — R0789 Other chest pain: Secondary | ICD-10-CM

## 2015-08-21 MED ORDER — IOHEXOL 300 MG/ML  SOLN
100.0000 mL | Freq: Once | INTRAMUSCULAR | Status: AC | PRN
  Administered 2015-08-21: 75 mL via INTRAVENOUS

## 2015-08-21 MED ORDER — OXYCODONE-ACETAMINOPHEN 5-325 MG PO TABS
1.0000 | ORAL_TABLET | Freq: Once | ORAL | Status: AC
  Administered 2015-08-21: 1 via ORAL
  Filled 2015-08-21: qty 1

## 2015-08-21 MED ORDER — ONDANSETRON 4 MG PO TBDP
4.0000 mg | ORAL_TABLET | Freq: Once | ORAL | Status: AC
  Administered 2015-08-21: 4 mg via ORAL
  Filled 2015-08-21: qty 1

## 2015-08-21 MED ORDER — NAPROXEN 500 MG PO TABS: 500.0000 mg | ORAL_TABLET | Freq: Two times a day (BID) | ORAL | Status: DC | PRN

## 2015-08-21 MED ORDER — OXYCODONE-ACETAMINOPHEN 5-325 MG PO TABS: 2.0000 | ORAL_TABLET | ORAL | Status: DC | PRN
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
## 2015-08-21 NOTE — ED Notes (Signed)
Pt returns from radiology. 
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
## 2015-08-26 ENCOUNTER — Emergency Department (HOSPITAL_COMMUNITY)
Admission: EM | Admit: 2015-08-26 | Discharge: 2015-08-26 | Disposition: A | Discharge status: Home / Self Care | Payer: Self-pay | Source: Home / Self Care | Attending: Family Medicine | Admitting: Family Medicine

## 2015-08-30 ENCOUNTER — Encounter (HOSPITAL_COMMUNITY): Payer: Self-pay | Admitting: *Deleted

## 2015-08-30 ENCOUNTER — Emergency Department (INDEPENDENT_AMBULATORY_CARE_PROVIDER_SITE_OTHER)
Admission: EM | Admit: 2015-08-30 | Discharge: 2015-08-30 | Disposition: A | Discharge status: Home / Self Care | Source: Home / Self Care | Attending: Emergency Medicine | Admitting: Emergency Medicine

## 2015-08-30 DIAGNOSIS — S20211D Contusion of right front wall of thorax, subsequent encounter: Secondary | ICD-10-CM | POA: Diagnosis not present

## 2015-08-30 DIAGNOSIS — S301XXD Contusion of abdominal wall, subsequent encounter: Secondary | ICD-10-CM
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
## 2015-08-30 NOTE — Discharge Instructions (Signed)
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
## 2015-09-07 DIAGNOSIS — F410 Panic disorder [episodic paroxysmal anxiety] without agoraphobia: Secondary | ICD-10-CM | POA: Insufficient documentation

## 2016-01-31 ENCOUNTER — Emergency Department (HOSPITAL_COMMUNITY)
Admission: EM | Admit: 2016-01-31 | Discharge: 2016-01-31 | Disposition: A | Discharge status: Home / Self Care | Attending: Emergency Medicine | Admitting: Emergency Medicine

## 2016-01-31 ENCOUNTER — Encounter (HOSPITAL_COMMUNITY): Payer: Self-pay | Admitting: Nurse Practitioner

## 2016-01-31 DIAGNOSIS — M25562 Pain in left knee: Secondary | ICD-10-CM | POA: Diagnosis not present

## 2016-01-31 DIAGNOSIS — M79672 Pain in left foot: Secondary | ICD-10-CM | POA: Diagnosis not present

## 2016-01-31 DIAGNOSIS — M79671 Pain in right foot: Secondary | ICD-10-CM | POA: Diagnosis not present

## 2016-01-31 DIAGNOSIS — I10 Essential (primary) hypertension: Secondary | ICD-10-CM | POA: Diagnosis not present

## 2016-01-31 DIAGNOSIS — Z79899 Other long term (current) drug therapy: Secondary | ICD-10-CM | POA: Diagnosis not present

## 2016-01-31 DIAGNOSIS — Z791 Long term (current) use of non-steroidal anti-inflammatories (NSAID): Secondary | ICD-10-CM | POA: Insufficient documentation

## 2016-01-31 HISTORY — DX: Unspecified osteoarthritis, unspecified site: M19.90

## 2016-01-31 HISTORY — DX: Arteritis, unspecified: I77.6

## 2016-01-31 HISTORY — DX: Gastro-esophageal reflux disease without esophagitis: K21.9

## 2016-01-31 LAB — CBG MONITORING, ED: Glucose-Capillary: 91 mg/dL (ref 65–99)

## 2016-01-31 MED ORDER — NAPROXEN 500 MG PO TABS: 500.0000 mg | ORAL_TABLET | Freq: Two times a day (BID) | ORAL | Status: DC

## 2016-01-31 NOTE — Discharge Instructions (Signed)
Your pain is likely due to arthritis and possibly plantar fasciitis. Take naproxen twice daily and Tylenol every 6 hours as needed for pain.  You may try orthotic shoes.  Apply ice for 20 min as needed.  Follow up with your podiatrist and primary care doctor who could refer you to an orthopedist for further evaluation of your knee pain.   Knee Pain Knee pain is a very common symptom and can have many causes. Knee pain often goes away when you follow your health care provider's instructions for relieving pain and discomfort at home. However, knee pain can develop into a condition that needs treatment. Some conditions may include:  Arthritis caused by wear and tear (osteoarthritis).  Arthritis caused by swelling and irritation (rheumatoid arthritis or gout).  A cyst or growth in your knee.  An infection in your knee joint.  An injury that will not heal.  Damage, swelling, or irritation of the tissues that support your knee (torn ligaments or tendinitis). If your knee pain continues, additional tests may be ordered to diagnose your condition. Tests may include X-rays or other imaging studies of your knee. You may also need to have fluid removed from your knee. Treatment for ongoing knee pain depends on the cause, but treatment may include:  Medicines to relieve pain or swelling.  Steroid injections in your knee.  Physical therapy.  Surgery. HOME CARE INSTRUCTIONS  Take medicines only as directed by your health care provider.  Rest your knee and keep it raised (elevated) while you are resting.  Do not do things that cause or worsen pain.  Avoid high-impact activities or exercises, such as running, jumping rope, or doing jumping jacks.  Apply ice to the knee area:  Put ice in a plastic bag.  Place a towel between your skin and the bag.  Leave the ice on for 20 minutes, 2-3 times a day.  Ask your health care provider if you should wear an elastic knee support.  Keep a pillow  under your knee when you sleep.  Lose weight if you are overweight. Extra weight can put pressure on your knee.  Do not use any tobacco products, including cigarettes, chewing tobacco, or electronic cigarettes. If you need help quitting, ask your health care provider. Smoking may slow the healing of any bone and joint problems that you may have. SEEK MEDICAL CARE IF:  Your knee pain continues, changes, or gets worse.  You have a fever along with knee pain.  Your knee buckles or locks up.  Your knee becomes more swollen. SEEK IMMEDIATE MEDICAL CARE IF:   Your knee joint feels hot to the touch.  You have chest pain or trouble breathing.   This information is not intended to replace advice given to you by your health care provider. Make sure you discuss any questions you have with your health care provider.   Document Released: 06/03/2007 Document Revised: 08/27/2014 Document Reviewed: 03/22/2014 Elsevier Interactive Patient Education 2016 Elsevier Inc[employer]  Plantar Fasciitis   Plantar fasciitis is a painful foot condition that affects the heel. It occurs when the band of tissue that connects the toes to the heel bone (plantar fascia) becomes irritated. This can happen after exercising too much or doing other repetitive activities (overuse injury). The pain from plantar fasciitis can range from mild irritation to severe pain that makes it difficult for you to walk or move. The pain is usually worse in the morning or after you have been sitting or lying down  for a while.  CAUSES  This condition may be caused by:  Standing for long periods of time.  Wearing shoes that do not fit.  Doing high-impact activities, including running, aerobics, and ballet.  Being overweight.  Having an abnormal way of walking (gait).  Having tight calf muscles.  Having high arches in your feet.  Starting a new athletic activity. SYMPTOMS  The main symptom of this condition is heel pain. Other symptoms  include:  Pain that gets worse after activity or exercise.  Pain that is worse in the morning or after resting.  Pain that goes away after you walk for a few minutes. DIAGNOSIS  This condition may be diagnosed based on your signs and symptoms. Your health care provider will also do a physical exam to check for:  A tender area on the bottom of your foot.  A high arch in your foot.  Pain when you move your foot.  Difficulty moving your foot. You may also need to have imaging studies to confirm the diagnosis. These can include:  X-rays.  Ultrasound.  MRI. TREATMENT  Treatment for plantar fasciitis depends on the severity of the condition. Your treatment may include:  Rest, ice, and over-the-counter pain medicines to manage your pain.  Exercises to stretch your calves and your plantar fascia.  A splint that holds your foot in a stretched, upward position while you sleep (night splint).  Physical therapy to relieve symptoms and prevent problems in the future.  Cortisone injections to relieve severe pain.  Extracorporeal shock wave therapy (ESWT) to stimulate damaged plantar fascia with electrical impulses. It is often used as a last resort before surgery.  Surgery, if other treatments have not worked after 12 months. HOME CARE INSTRUCTIONS  Take medicines only as directed by your health care provider.  Avoid activities that cause pain.  Roll the bottom of your foot over a bag of ice or a bottle of cold water. Do this for 20 minutes, 3-4 times a day.  Perform simple stretches as directed by your health care provider.  Try wearing athletic shoes with air-sole or gel-sole cushions or soft shoe inserts.  Wear a night splint while sleeping, if directed by your health care provider.  Keep all follow-up appointments with your health care provider. PREVENTION  Do not perform exercises or activities that cause heel pain.  Consider finding low-impact activities if you continue to have problems.    Lose weight if you need to. The best way to prevent plantar fasciitis is to avoid the activities that aggravate your plantar fascia.  SEEK MEDICAL CARE IF:  Your symptoms do not go away after treatment with home care measures.  Your pain gets worse.  Your pain affects your ability to move or do your daily activities. This information is not intended to replace advice given to you by your health care provider. Make sure you discuss any questions you have with your health care provider.  Document Released: 05/01/2001 Document Revised: 04/27/2015 Document Reviewed: 06/16/2014  Elsevier Interactive Patient Education [employer].

## 2016-01-31 NOTE — ED Notes (Signed)
Pt c/o left knee pain x 1 week. Started 2 days ago with sharp pain to top of both feet. No known injury.

## 2016-01-31 NOTE — ED Notes (Signed)
CBG 91 Norva Pavlov RN notified

## 2016-01-31 NOTE — ED Notes (Signed)
Pt verbalized understanding to follow up with pcp, podiatrist and ortho. Pt stable and NAD.

## 2016-01-31 NOTE — ED Notes (Signed)
Pt c/o 1 week history of L knee pain and bilateral foot pain. She describes as feeling "stiff and sore." she tried ice and aleve with some relief. Pain increased when standing to work today. She denies any injuries.

## 2016-01-31 NOTE — ED Provider Notes (Signed)
CSN: [id number]     Arrival date & time 01/31/16  1304 History   By signing my name below, I, Rowan Blase, attest that this documentation has been prepared under the direction and in the presence of non-physician practitioner, Gloriann Loan, PA-C. Electronically Signed: Rowan Blase, Scribe. 01/31/2016. 3:30 PM.    Chief Complaint  Patient presents with  . Leg Pain   The history is provided by the patient. No language interpreter was used.   HPI Comments:  Jane [name] is a 60 y.o. female with PMHx of arthritis and plantar fasciitis who presents to the Emergency Department complaining of left knee stiffness and soreness for the past week. Pain worsens with standing and she has heard small popping noises from knee. Pt reports associated soreness in the top of both feet with shooting pain beginning 2 days ago. She has applied ice and taken Aleve with some relief; she has also used a ace wrap and band on her knee. Worse in the AM.  Pt has received injections in her feet previously with relief. Pt notes she stands and walks a lot during work. Denies recent injury, color change, swelling, numbness or weakness.   Past Medical History  Diagnosis Date  . Hypertension   . Reflux   . Arthritis   . Vasculitis ([place])   . Acid reflux    Past Surgical History  Procedure Laterality Date  . Tubal ligation    . Colonoscopy     History reviewed. No pertinent family history. Social History  Substance Use Topics  . Smoking status: Never Smoker   . Smokeless tobacco: None  . Alcohol Use: No   OB History    No data available     Review of Systems  Musculoskeletal: Positive for myalgias and arthralgias.  Neurological: Negative for weakness and numbness.    Allergies  Review of patient's allergies indicates no known allergies.  Home Medications   Prior to Admission medications   Medication Sig Start Date End Date Taking? Authorizing Provider  Cholecalciferol (VITAMIN D-3 PO) Take 1  tablet by mouth daily.   Yes Historical Provider, MD  hydrochlorothiazide (HYDRODIURIL) 25 MG tablet Take 25 mg by mouth daily.   Yes Historical Provider, MD  lansoprazole (PREVACID) 15 MG capsule Take 15 mg by mouth as needed (heartburn).    Yes Historical Provider, MD  LORazepam (ATIVAN) 0.5 MG tablet Take 1 tablet by mouth daily. 07/19/15  Yes Historical Provider, MD  VITAMIN E PO Take 1 capsule by mouth daily.   Yes Historical Provider, MD  naproxen (NAPROSYN) 500 MG tablet Take 1 tablet (500 mg total) by mouth 2 (two) times daily. 01/31/16   Nigil Braman, PA-C   BP 141/65 mmHg  Pulse 80  Temp(Src) 98 F (36.7 C) (Oral)  Resp 20  SpO2 100%   Physical Exam  Constitutional: She is oriented to person, place, and time. She appears well-developed and well-nourished.  HENT:  Head: Normocephalic and atraumatic.  Right Ear: External ear normal.  Left Ear: External ear normal.  Eyes: Conjunctivae are normal. No scleral icterus.  Neck: No tracheal deviation present.  Cardiovascular: Intact distal pulses.   Pulses:      Dorsalis pedis pulses are 2+ on the right side, and 2+ on the left side.  Brisk capillary refill.   Pulmonary/Chest: Effort normal. No respiratory distress.  Abdominal: She exhibits no distension.  Musculoskeletal: Normal range of motion.       Right knee: Normal.  Left knee: She exhibits normal range of motion, no swelling, no ecchymosis, no deformity, no LCL laxity, no bony tenderness and no MCL laxity. No tenderness found.       Right ankle: Normal.       Left ankle: Normal.       Right foot: There is tenderness (very mild along dorsal aspect bilaterally ).       Left foot: There is tenderness.  Neurological: She is alert and oriented to person, place, and time.  Strength and sensation intact bilaterally throughout lower extremities.  5/5 strength. Normal gait.   Skin: Skin is warm and dry. No erythema.  Psychiatric: She has a normal mood and affect. Her behavior  is normal.    ED Course  Procedures  DIAGNOSTIC STUDIES:  Oxygen Saturation is 98% on RA, normal by my interpretation.    COORDINATION OF CARE:  3:22 PM Recommended pt wear appropriate shoes and see podiatrist. Will refer to orthopedist and start on Naproxen. Discussed treatment plan with pt at bedside and pt agreed to plan.  Labs Review Labs Reviewed  CBG MONITORING, ED    Imaging Review No results found. I have personally reviewed and evaluated these images and lab results as part of my medical decision-making.   EKG Interpretation None      MDM   Final diagnoses:  Left knee pain  Bilateral foot pain   Patient presents with left knee pain and b/l dorsal foot pain.  VSS, NAD.  Neurovascularly intact.  No signs of infection.  Ambulatory without difficulty.  Hx of plantar fasciitis and OA in left knee.  No swelling.  Doubt venous/arterial occlusion.  Offered plain films; however, patient declines.  I feel this is appropriate, she has good follow up, and I do not believe it would change management.  Patient has podiatrist and PCP.  Follow up with them.  D/c home with naproxen.  Tylenol as needed q6h.  RICE therapy.  Possible Orthotic shoes.  Discussed return precautions.  Patient agrees and acknowledges the above plan for discharge.   I personally performed the services described in this documentation, which was scribed in my presence. The recorded information has been reviewed and is accurate.     Gloriann Loan, PA-C 01/31/16 1631  Sherwood Gambler, MD 02/03/16 0040

## 2016-02-23 ENCOUNTER — Ambulatory Visit: Admitting: Podiatry

## 2016-02-29 ENCOUNTER — Ambulatory Visit (INDEPENDENT_AMBULATORY_CARE_PROVIDER_SITE_OTHER): Admitting: Podiatry

## 2016-02-29 ENCOUNTER — Other Ambulatory Visit: Payer: Self-pay

## 2016-02-29 ENCOUNTER — Encounter: Payer: Self-pay | Admitting: Podiatry

## 2016-02-29 VITALS — BP 158/88 | HR 75 | Ht 64.0 in | Wt 229.0 lb

## 2016-02-29 DIAGNOSIS — M722 Plantar fascial fibromatosis: Secondary | ICD-10-CM

## 2016-02-29 DIAGNOSIS — M659 Synovitis and tenosynovitis, unspecified: Secondary | ICD-10-CM

## 2016-02-29 DIAGNOSIS — M21969 Unspecified acquired deformity of unspecified lower leg: Secondary | ICD-10-CM | POA: Diagnosis not present

## 2016-02-29 NOTE — Progress Notes (Signed)
SUBJECTIVE: 60 y.o. year old female presents stating she always had heel pain off and on 6-7 years. On feet long hours working 2 jobs. Now pain is on top dorsum for 2 weeks. Right foot pain > left foot.  She went to ER for foot pain 2-3 weeks ago. Stated that she used to wear custom orthotic when she was young growing up.  REVIEW OF SYSTEMS: Positive for Hypertension, Acid Reflux, and frequent left knee pain.   OBJECTIVE: DERMATOLOGIC EXAMINATION: No abnormal lesions.  EXAMINATION OF LOWER LIMBS: Pedal pulses: All pedal pulses are palpable with normal pulsation.  Capillary Filling times within 3 seconds in all digits.  No edema or erythema noted. Temperature gradient from tibial crest to dorsum of foot is within normal bilateral.  NEUROLOGIC EXAMINATION OF THE LOWER LIMBS: All epicritic and tactile sensations grossly intact.  Sharp and Dull discriminatory sensations at the plantar ball of hallux is intact bilateral.   MUSCULOSKELETAL EXAMINATION: Hypermobile first ray in sagittal plane bilateral.  RADIOGRAPHIC STUDIES:  AP View:  Metatarsus adductus left, Short first metatarsal bilateral, laterally subluxed 2nd MPJ right,  Lateral view:  Supinated foot with midtarsal sagging  Spurs present: Plantar calcaneal tuberosity on right, plantar and posterior tuberosity left.  Elevated first ray L>R.  Normal Cyma line bilateral.  ASSESSMENT: Tenosynovitis midfoot bilateral. Hypermobile first ray bilateral. Plantar fasciitis bilateral. Pronating Rear foot.  PLAN: Reviewed clinical findings and available treatment options, injection, NSAIA, Orthotics, change in activities. Patient will return to have custom orthotics made.

## 2016-02-29 NOTE — Patient Instructions (Signed)
Pain in both feet. Noted of short first metatarsal with excess motion. Need custom orthotics.

## 2016-03-13 ENCOUNTER — Encounter: Payer: Self-pay | Admitting: Podiatry

## 2016-03-13 ENCOUNTER — Ambulatory Visit (INDEPENDENT_AMBULATORY_CARE_PROVIDER_SITE_OTHER): Admitting: Podiatry

## 2016-03-13 VITALS — BP 156/84 | HR 69

## 2016-03-13 DIAGNOSIS — M722 Plantar fascial fibromatosis: Secondary | ICD-10-CM

## 2016-03-13 NOTE — Patient Instructions (Signed)
Both feet casted for orthotics. 

## 2016-03-13 NOTE — Progress Notes (Signed)
SUBJECTIVE: 60 y.o. year old female presents to have orthotics made.  She always had heel pain off and on 6-7 years. On feet long hours working 2 jobs. Now pain is on top dorsum for 2 weeks. Right foot pain > left foot.  She went to ER for foot pain 2-3 weeks ago. Stated that she used to wear custom orthotic when she was young growing up.  REVIEW OF SYSTEMS: Positive for Hypertension, Acid Reflux, and frequent left knee pain.   OBJECTIVE: DERMATOLOGIC EXAMINATION: No abnormal lesions.  EXAMINATION OF LOWER LIMBS: Pedal pulses: All pedal pulses are palpable with normal pulsation.  Capillary Filling times within 3 seconds in all digits.  No edema or erythema noted. Temperature gradient from tibial crest to dorsum of foot is within normal bilateral.  NEUROLOGIC EXAMINATION OF THE LOWER LIMBS: All epicritic and tactile sensations grossly intact.  Sharp and Dull discriminatory sensations at the plantar ball of hallux is intact bilateral.   MUSCULOSKELETAL EXAMINATION: Hypermobile first ray in sagittal plane bilateral.  RADIOGRAPHIC STUDIES:  AP View:  Metatarsus adductus left, Short first metatarsal bilateral, laterally subluxed 2nd MPJ right,  Lateral view:  Supinated foot with midtarsal sagging  Spurs present: Plantar calcaneal tuberosity on right, plantar and posterior tuberosity left.  Elevated first ray L>R.  Normal Cyma line bilateral.  ASSESSMENT: Tenosynovitis midfoot bilateral. Hypermobile first ray bilateral. Plantar fasciitis bilateral. Pronating Rear foot.  PLAN: Both feet casted for orthotics.

## 2016-05-01 ENCOUNTER — Other Ambulatory Visit: Payer: Self-pay | Admitting: Family Medicine

## 2016-05-01 ENCOUNTER — Ambulatory Visit
Admission: RE | Admit: 2016-05-01 | Discharge: 2016-05-01 | Disposition: A | Discharge status: Home / Self Care | Source: Ambulatory Visit | Attending: Family Medicine | Admitting: Family Medicine

## 2016-05-01 DIAGNOSIS — M545 Low back pain: Secondary | ICD-10-CM

## 2016-08-09 ENCOUNTER — Ambulatory Visit: Admitting: Podiatry

## 2016-08-18 ENCOUNTER — Emergency Department (HOSPITAL_COMMUNITY)
Admission: EM | Admit: 2016-08-18 | Discharge: 2016-08-18 | Disposition: A | Discharge status: Home / Self Care | Attending: Emergency Medicine | Admitting: Emergency Medicine

## 2016-08-18 ENCOUNTER — Encounter (HOSPITAL_COMMUNITY): Payer: Self-pay

## 2016-08-18 ENCOUNTER — Emergency Department (HOSPITAL_COMMUNITY)

## 2016-08-18 DIAGNOSIS — F419 Anxiety disorder, unspecified: Secondary | ICD-10-CM | POA: Diagnosis present

## 2016-08-18 DIAGNOSIS — E876 Hypokalemia: Secondary | ICD-10-CM | POA: Diagnosis not present

## 2016-08-18 DIAGNOSIS — R000 Tachycardia, unspecified: Secondary | ICD-10-CM | POA: Insufficient documentation

## 2016-08-18 DIAGNOSIS — I10 Essential (primary) hypertension: Secondary | ICD-10-CM | POA: Insufficient documentation

## 2016-08-18 LAB — BASIC METABOLIC PANEL
ANION GAP: 8 (ref 5–15)
BUN: 13 mg/dL (ref 6–20)
CHLORIDE: 102 mmol/L (ref 101–111)
CO2: 28 mmol/L (ref 22–32)
CREATININE: 1 mg/dL (ref 0.44–1.00)
Calcium: 9.7 mg/dL (ref 8.9–10.3)
GFR calc non Af Amer: 59 mL/min — ABNORMAL LOW (ref >60)
Glucose, Bld: 136 mg/dL — ABNORMAL HIGH (ref 65–99)
Potassium: 3.4 mmol/L — ABNORMAL LOW (ref 3.5–5.1)
Sodium: 138 mmol/L (ref 135–145)

## 2016-08-18 LAB — CBC
HCT: 39.2 % (ref 36.0–46.0)
HEMOGLOBIN: 13.1 g/dL (ref 12.0–15.0)
MCH: 30.3 pg (ref 26.0–34.0)
MCHC: 33.4 g/dL (ref 30.0–36.0)
MCV: 90.5 fL (ref 78.0–100.0)
Platelets: 208 10*3/uL (ref 150–400)
RBC: 4.33 MIL/uL (ref 3.87–5.11)
RDW: 13 % (ref 11.5–15.5)
WBC: 10 10*3/uL (ref 4.0–10.5)

## 2016-08-18 LAB — TROPONIN I: Troponin I: <0.03 ng/mL (ref <0.03)

## 2016-08-18 MED ORDER — POTASSIUM CHLORIDE CRYS ER 20 MEQ PO TBCR: 20.0000 meq | EXTENDED_RELEASE_TABLET | Freq: Every day | ORAL | 0 refills | Status: DC

## 2016-08-18 NOTE — ED Notes (Signed)
MD at bedside. 

## 2016-08-18 NOTE — ED Notes (Signed)
Pt stable, understands discharge instructions, and reasons for return.   

## 2016-08-18 NOTE — ED Notes (Signed)
MD aware of elevated vitals

## 2016-08-18 NOTE — ED Provider Notes (Signed)
[facility] DEPT Provider Note   CSN: [id number] Arrival date & time: 08/18/16  2024     History   Chief Complaint Chief Complaint  Patient presents with  . Anxiety    HPI Jane [name] is a 61 y.o. female.  HPI Patient presents with the feeling of her heart racing and Shortness of breath. States she feels anxious with it. She is on blood pressure medicines. States that she has not lost weight. States she tends to get anxious and she is working 2 jobs. States her blood pressure is always high when she goes to the doctor. She states she is an anxious person. No swelling or legs. No fevers. She states the episodes of feeling her heart go fast, and go. States that she'll take a few deep breaths and sometimes that will help with it. She states that she has had her thyroid looked at in the past and does not have any thyroid abnormalities. Denies substance abuse. Past Medical History:  Diagnosis Date  . Acid reflux   . Arthritis   . Hypertension   . Reflux   . Vasculitis [facility])     Patient Active Problem List   Diagnosis Date Noted  . Plantar fasciitis, bilateral 02/29/2016  . Metatarsal deformity 02/29/2016  . Tenosynovitis of foot 02/29/2016    Past Surgical History:  Procedure Laterality Date  . COLONOSCOPY    . TUBAL LIGATION      OB History    No data available       Home Medications    Prior to Admission medications   Medication Sig Start Date End Date Taking? Authorizing Provider  B Complex Vitamins (VITAMIN-B COMPLEX) TABS Take by mouth.    Historical Provider, MD  Cholecalciferol (D 1000) 1000 units capsule Take by mouth.    Historical Provider, MD  DOCOSAHEXAENOIC ACID PO Take by mouth.    Historical Provider, MD  hydrochlorothiazide (HYDRODIURIL) 25 MG tablet Take 25 mg by mouth daily.    Historical Provider, MD  lisinopril-hydrochlorothiazide Reita May) 20-12.5 MG tablet Reported on 02/29/2016 06/14/15   Historical Provider, MD  LORazepam  (ATIVAN) 0.5 MG tablet Take 1 tablet by mouth daily. 07/19/15   Historical Provider, MD  naproxen (NAPROSYN) 500 MG tablet Take 1 tablet (500 mg total) by mouth 2 (two) times daily. Patient not taking: Reported on 02/29/2016 01/31/16   Gloriann Loan, PA-C  NEXIUM 40 MG capsule TK 1 C PO QD 01/09/16   Historical Provider, MD  potassium chloride SA (K-DUR,KLOR-CON) 20 MEQ tablet Take 1 tablet (20 mEq total) by mouth daily. 08/18/16   Davonna Belling, MD  VITAMIN E PO Take 1 capsule by mouth daily.    Historical Provider, MD  zinc gluconate 50 MG tablet Take 50 mg by mouth.    Historical Provider, MD    Family History History reviewed. No pertinent family history.  Social History Social History  Substance Use Topics  . Smoking status: Never Smoker  . Smokeless tobacco: Never Used  . Alcohol use No     Allergies   Patient has no known allergies.   Review of Systems Review of Systems  Constitutional: Negative for appetite change.  HENT: Negative for congestion, trouble swallowing and voice change.   Respiratory: Positive for shortness of breath.   Cardiovascular: Positive for palpitations. Negative for chest pain.  Gastrointestinal: Negative for abdominal pain.  Genitourinary: Negative for dyspareunia.  Musculoskeletal: Negative for back pain.  Skin: Negative for wound.  Neurological: Negative for weakness and numbness.  Psychiatric/Behavioral: The patient is nervous/anxious.      Physical Exam Updated Vital Signs BP (!) 203/117 (BP Location: Right Arm)   Pulse (!) 127   Temp 98.2 F (36.8 C) (Oral)   Resp 22   Ht 5' 4.5" (1.638 m)   Wt 217 lb (98.4 kg)   SpO2 100%   BMI 36.67 kg/m   Physical Exam  Constitutional: She appears well-developed.  HENT:  Head: Atraumatic.  Neck: Neck supple.  Cardiovascular:  Regular tachycardia  Pulmonary/Chest: Effort normal.  Abdominal: Soft.  Musculoskeletal: Normal range of motion.  Neurological: She is alert.  Skin: Skin is  warm. Capillary refill takes less than 2 seconds.     ED Treatments / Results  Labs (all labs ordered are listed, but only abnormal results are displayed) Labs Reviewed  BASIC METABOLIC PANEL - Abnormal; Notable for the following:       Result Value   Potassium 3.4 (*)    Glucose, Bld 136 (*)    GFR calc non Af Amer 59 (*)    All other components within normal limits  CBC  TROPONIN I    EKG  EKG Interpretation  Date/Time:  Saturday August 18 2016 21:09:21 EST Ventricular Rate:  116 PR Interval:  166 QRS Duration: 82 QT Interval:  326 QTC Calculation: 453 R Axis:   41 Text Interpretation:  Sinus tachycardia Cannot rule out Anterior infarct , age undetermined Abnormal ECG Confirmed by Alvino Chapel  MD, Tayson Schnelle [phone]) on 08/18/2016 9:12:35 PM       Radiology Dg Chest 2 View  Result Date: 08/18/2016 CLINICAL DATA:  Anxiety. EXAM: CHEST  2 VIEW COMPARISON:  Chest CT 08/21/2015.  Radiographs 04/09/2015 FINDINGS: The cardiomediastinal contours are normal. Subsegmental atelectasis at the left lung base. Pulmonary vasculature is normal. No consolidation, pleural effusion, or pneumothorax. No acute osseous abnormalities are seen. Overlying monitoring devices project over the chest. IMPRESSION: Subsegmental left basilar atelectasis. Otherwise no acute abnormality. Electronically Signed   By: Jeb Levering M.D.   On: 08/18/2016 22:02    Procedures Procedures (including critical care time)  Medications Ordered in ED Medications - No data to display   Initial Impression / Assessment and Plan / ED Course  I have reviewed the triage vital signs and the nursing notes.  Pertinent labs & imaging results that were available during my care of the patient were reviewed by me and considered in my medical decision making (see chart for details).  Clinical Course     Patient with tachycardia hypertension and episodes of anxiety. While at rest her heart rate is right around 100 and  blood pressures improved to [id number] systolic. However she was at walk in the room her heart rate goes back up to 140. At this point I think there is an anxiety component to think it is not worth adjusting her medicines in the ER although she may need some further adjustment overall. Her potassium is mildly low and will supplement. Will need to follow with primary care doctor.  Final Clinical Impressions(s) / ED Diagnoses   Final diagnoses:  Anxiety  Tachycardia  Hypokalemia    New Prescriptions New Prescriptions   POTASSIUM CHLORIDE SA (K-DUR,KLOR-CON) 20 MEQ TABLET    Take 1 tablet (20 mEq total) by mouth daily.     Davonna Belling, MD 08/18/16 2236

## 2016-08-18 NOTE — ED Triage Notes (Addendum)
Pt from home and states "I have a hx of panic attacks but for the last 2 weeks my heart has been racing" Pt does not appear anxious in triage but complains of same palpitations. Pt tachycardic and hypertensive in triage, pt states she took her BP meds pta. Pt able to speak in complete sentences. No obvious distress.

## 2017-01-14 ENCOUNTER — Emergency Department (HOSPITAL_COMMUNITY)
Admission: EM | Admit: 2017-01-14 | Discharge: 2017-01-15 | Disposition: A | Discharge status: Home / Self Care | Attending: Emergency Medicine | Admitting: Emergency Medicine

## 2017-01-14 ENCOUNTER — Encounter (HOSPITAL_COMMUNITY): Payer: Self-pay

## 2017-01-14 DIAGNOSIS — I10 Essential (primary) hypertension: Secondary | ICD-10-CM | POA: Insufficient documentation

## 2017-01-14 DIAGNOSIS — Z79899 Other long term (current) drug therapy: Secondary | ICD-10-CM | POA: Diagnosis not present

## 2017-01-14 DIAGNOSIS — R531 Weakness: Secondary | ICD-10-CM | POA: Diagnosis present

## 2017-01-14 LAB — CBC
HEMATOCRIT: 40.2 % (ref 36.0–46.0)
HEMOGLOBIN: 13.1 g/dL (ref 12.0–15.0)
MCH: 30.1 pg (ref 26.0–34.0)
MCHC: 32.6 g/dL (ref 30.0–36.0)
MCV: 92.4 fL (ref 78.0–100.0)
Platelets: 213 10*3/uL (ref 150–400)
RBC: 4.35 MIL/uL (ref 3.87–5.11)
RDW: 13.1 % (ref 11.5–15.5)
WBC: 9.2 10*3/uL (ref 4.0–10.5)

## 2017-01-14 LAB — URINALYSIS, ROUTINE W REFLEX MICROSCOPIC
BILIRUBIN URINE: NEGATIVE
Glucose, UA: NEGATIVE mg/dL
Hgb urine dipstick: NEGATIVE
Ketones, ur: NEGATIVE mg/dL
LEUKOCYTES UA: NEGATIVE
NITRITE: NEGATIVE
PH: 5 (ref 5.0–8.0)
Protein, ur: NEGATIVE mg/dL
SPECIFIC GRAVITY, URINE: 1.005 (ref 1.005–1.030)

## 2017-01-14 LAB — BASIC METABOLIC PANEL
ANION GAP: 11 (ref 5–15)
BUN: 13 mg/dL (ref 6–20)
CALCIUM: 9.4 mg/dL (ref 8.9–10.3)
CO2: 25 mmol/L (ref 22–32)
Chloride: 99 mmol/L — ABNORMAL LOW (ref 101–111)
Creatinine, Ser: 0.88 mg/dL (ref 0.44–1.00)
GFR calc non Af Amer: >60 mL/min (ref >60)
Glucose, Bld: 95 mg/dL (ref 65–99)
POTASSIUM: 3.9 mmol/L (ref 3.5–5.1)
Sodium: 135 mmol/L (ref 135–145)

## 2017-01-14 NOTE — ED Triage Notes (Signed)
Pt states she had sudden onset of generalized weakness on this past Wednesday; pt states she has had decreased appetite and feelings of just being fatigued; pt has hx of HTN and presents hypertensive at triage; pt denies sob, or problems ambulating; pt speaking in full complete sentences; Pt denies pain at triage but states feeling of being anxious.

## 2017-01-14 NOTE — ED Notes (Signed)
Patient reports generalized weakness x 5 days - states it started with nausea, bloating, and heartburn and then upper respiratory symptoms, such as non-productive cough, nasal drainage, sore throat, and ear irritation (clogged feeling). Pt denies SOB/CP/fevers or chills. Resp e/u.

## 2017-01-14 NOTE — ED Notes (Signed)
Patient ambulatory to restroom and back with steady gait. No distress noted.

## 2017-01-15 NOTE — ED Notes (Signed)
Patient verbalized understanding of discharge instructions and denies any further needs or questions at this time. VS stable. Patient ambulatory with steady gait.  

## 2017-01-15 NOTE — ED Provider Notes (Signed)
[facility] DEPT Provider Note   CSN: [id number] Arrival date & time: 01/14/17  1904     History   Chief Complaint Chief Complaint  Patient presents with  . Weakness    HPI Jane [name] is a 61 y.o. female.  HPI Patient presents the emergency department complaining of generalized fatigue and weakness over the past 5-7 days.  Said some nasal drainage and sore throat night irritation as well as nausea.  Denies vomiting.  No diarrhea.  Denies abdominal pain.  Symptoms are mild in severity.  No blood in her stool.  No vomiting.  Symptoms are mild in severity.  She's currently working 2 jobs.  She sleeps less than 5 hours a night.   Past Medical History:  Diagnosis Date  . Acid reflux   . Arthritis   . Hypertension   . Reflux   . Vasculitis [facility])     Patient Active Problem List   Diagnosis Date Noted  . Plantar fasciitis, bilateral 02/29/2016  . Metatarsal deformity 02/29/2016  . Tenosynovitis of foot 02/29/2016    Past Surgical History:  Procedure Laterality Date  . COLONOSCOPY    . TUBAL LIGATION      OB History    No data available       Home Medications    Prior to Admission medications   Medication Sig Start Date End Date Taking? Authorizing Provider  B Complex Vitamins (VITAMIN-B COMPLEX) TABS Take 1 tablet by mouth daily.    Yes [provider]  Cholecalciferol (D 1000) 1000 units capsule Take 1,000 Units by mouth daily.    Yes [provider]  hydrochlorothiazide (HYDRODIURIL) 25 MG tablet Take 25 mg by mouth daily.   Yes [provider]  LORazepam (ATIVAN) 0.5 MG tablet Take 1 tablet by mouth every 6 (six) hours as needed for anxiety.  07/19/15  Yes [provider]  Multiple Vitamin (MULTIVITAMIN WITH MINERALS) TABS tablet Take 1 tablet by mouth daily.   Yes [provider]  NEXIUM 40 MG capsule TK 1 C PO QD 01/09/16  Yes [provider]    Family History No family history on file.  Social  History Social History  Substance Use Topics  . Smoking status: Never Smoker  . Smokeless tobacco: Never Used  . Alcohol use No     Allergies   Patient has no known allergies.   Review of Systems Review of Systems  All other systems reviewed and are negative.    Physical Exam Updated Vital Signs BP 122/76   Pulse 61   Temp 98.6 F (37 C)   Resp 19   SpO2 99%   Physical Exam  Constitutional: She is oriented to person, place, and time. She appears well-developed and well-nourished. No distress.  HENT:  Head: Normocephalic and atraumatic.  Eyes: EOM are normal.  Neck: Normal range of motion.  Cardiovascular: Normal rate, regular rhythm and normal heart sounds.   Pulmonary/Chest: Effort normal and breath sounds normal.  Abdominal: Soft. She exhibits no distension. There is no tenderness.  Musculoskeletal: Normal range of motion.  Neurological: She is alert and oriented to person, place, and time.  Skin: Skin is warm and dry.  Psychiatric: She has a normal mood and affect. Judgment normal.  Nursing note and vitals reviewed.    ED Treatments / Results  Labs (all labs ordered are listed, but only abnormal results are displayed) Labs Reviewed  BASIC METABOLIC PANEL - Abnormal; Notable for the following:  Result Value   Chloride 99 (*)    All other components within normal limits  URINALYSIS, ROUTINE W REFLEX MICROSCOPIC - Abnormal; Notable for the following:    Color, Urine STRAW (*)    All other components within normal limits  CBC    EKG  EKG Interpretation None       Radiology No results found.  Procedures Procedures (including critical care time)  Medications Ordered in ED Medications - No data to display   Initial Impression / Assessment and Plan / ED Course  I have reviewed the triage vital signs and the nursing notes.  Pertinent labs & imaging results that were available during my care of the patient were reviewed by me and considered  in my medical decision making (see chart for details).     Patient is overall well-appearing.  No acute pathology found.  Likely viral illness.  Discharge home in good condition  Final Clinical Impressions(s) / ED Diagnoses   Final diagnoses:  None    New Prescriptions New Prescriptions   No medications on file     Jola Schmidt, MD 01/15/17 [phone]
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
## 2017-04-26 ENCOUNTER — Other Ambulatory Visit: Payer: Self-pay | Admitting: *Deleted

## 2017-04-26 ENCOUNTER — Ambulatory Visit
Admission: RE | Admit: 2017-04-26 | Discharge: 2017-04-26 | Disposition: A | Discharge status: Home / Self Care | Source: Ambulatory Visit | Attending: *Deleted | Admitting: *Deleted

## 2017-04-26 DIAGNOSIS — R059 Cough, unspecified: Secondary | ICD-10-CM

## 2017-04-26 DIAGNOSIS — R05 Cough: Secondary | ICD-10-CM

## 2017-09-01 ENCOUNTER — Ambulatory Visit (HOSPITAL_COMMUNITY)
Admission: EM | Admit: 2017-09-01 | Discharge: 2017-09-01 | Disposition: A | Discharge status: Home / Self Care | Attending: Internal Medicine | Admitting: Internal Medicine

## 2017-09-01 ENCOUNTER — Other Ambulatory Visit: Payer: Self-pay

## 2017-09-01 ENCOUNTER — Encounter (HOSPITAL_COMMUNITY): Payer: Self-pay | Admitting: *Deleted

## 2017-09-01 DIAGNOSIS — F419 Anxiety disorder, unspecified: Secondary | ICD-10-CM

## 2017-09-01 MED ORDER — LORAZEPAM 0.5 MG PO TABS: 0.5000 mg | ORAL_TABLET | Freq: Every day | ORAL | 0 refills | Status: DC | PRN

## 2017-09-01 NOTE — Discharge Instructions (Addendum)
Prescription for a small number of lorazepam, 5 tablets, sent to the pharmacy.  Keep follow-up appointment as scheduled with your primary care provider this Friday to discuss additional treatment options.

## 2017-09-01 NOTE — ED Triage Notes (Addendum)
Per pt she is out of Lorazepam and she had a panic attack. Per pt she has been under more stress lately

## 2017-09-01 NOTE — ED Provider Notes (Signed)
[facility]    CSN: [id number] Arrival date & time: 09/01/17  [place]     History   Chief Complaint Chief Complaint  Patient presents with  . Medication Refill  . Panic Attack    HPI Jane [name] is a 62 y.o. female.   She presents today after her second panic attack in about 48 hours.  She has not had one in several months, but is prone to them when she is a little tired, and she is currently working 2 jobs.  She just does not want to have to miss work because of a panic attack.  Has an appointment scheduled with her PCP on the 18th, to touch base.    HPI  Past Medical History:  Diagnosis Date  . Acid reflux   . Arthritis   . Hypertension   . Reflux   . Vasculitis [facility])     Patient Active Problem List   Diagnosis Date Noted  . Plantar fasciitis, bilateral 02/29/2016  . Metatarsal deformity 02/29/2016  . Tenosynovitis of foot 02/29/2016    Past Surgical History:  Procedure Laterality Date  . COLONOSCOPY    . TUBAL LIGATION       Home Medications    Prior to Admission medications   Medication Sig Start Date End Date Taking? Authorizing Provider  B Complex Vitamins (VITAMIN-B COMPLEX) TABS Take 1 tablet by mouth daily.     [provider]  Cholecalciferol (D 1000) 1000 units capsule Take 1,000 Units by mouth daily.     [provider]  hydrochlorothiazide (HYDRODIURIL) 25 MG tablet Take 25 mg by mouth daily.    [provider]  LORazepam (ATIVAN) 0.5 MG tablet Take 1 tablet by mouth every 6 (six) hours as needed for anxiety.  07/19/15   [provider]  LORazepam (ATIVAN) 0.5 MG tablet Take 1 tablet (0.5 mg total) by mouth daily as needed for anxiety. 09/01/17   Wynona Luna, MD  Multiple Vitamin (MULTIVITAMIN WITH MINERALS) TABS tablet Take 1 tablet by mouth daily.    [provider]  NEXIUM 40 MG capsule TK 1 C PO QD 01/09/16   [provider]    Family History History reviewed. No  pertinent family history.  Social History Social History   Tobacco Use  . Smoking status: Never Smoker  . Smokeless tobacco: Never Used  Substance Use Topics  . Alcohol use: No    Alcohol/week: 0.0 oz  . Drug use: No     Allergies   Patient has no known allergies.   Review of Systems Review of Systems  All other systems reviewed and are negative.    Physical Exam Triage Vital Signs ED Triage Vitals  Enc Vitals Group     BP 09/01/17 1736 (!) 162/87     Pulse Rate 09/01/17 1736 79     Resp 09/01/17 1736 18     Temp 09/01/17 1736 98 F (36.7 C)     Temp Source 09/01/17 1736 Oral     SpO2 09/01/17 1736 97 %     Weight --      Height --      Pain Score 09/01/17 1740 6     Pain Loc --    Updated Vital Signs BP (!) 162/87 (BP Location: Left Arm) Comment: Notified RN, pt sts she took BP meds @3pm  today  Pulse 79   Temp 98 F (36.7 C) (Oral)   Resp 18   SpO2 97%  Physical Exam  Constitutional: She is oriented to person, place, and time. No distress.  HENT:  Head: Atraumatic.  Eyes:  Conjugate gaze observed, no eye redness/discharge  Neck: Neck supple.  Cardiovascular: Normal rate.  Pulmonary/Chest: No respiratory distress.  Abdominal: She exhibits no distension.  Musculoskeletal: Normal range of motion. She exhibits no edema.  Neurological: She is alert and oriented to person, place, and time.  Skin: Skin is warm and dry.  Nursing note and vitals reviewed.    UC Treatments / Results   Procedures Procedures (including critical care time) None today  Final Clinical Impressions(s) / UC Diagnoses   Final diagnoses:  Anxiety   Prescription for a small number of lorazepam, 5 tablets, sent to the pharmacy.  Keep follow-up appointment as scheduled with your primary care provider this Friday to discuss additional treatment options  ED Discharge Orders        Ordered    LORazepam (ATIVAN) 0.5 MG tablet  Daily PRN     09/01/17 1759       Controlled  Substance Prescriptions [place] Controlled Substance Registry consulted? Yes, I have consulted the [place] Controlled Substances Registry for this patient, and feel the risk/benefit ratio today is favorable for proceeding with this prescription for a controlled substance.   Wynona Luna, MD 09/02/17 [phone]

## 2017-10-18 ENCOUNTER — Telehealth: Payer: Self-pay | Admitting: *Deleted

## 2017-10-18 NOTE — Telephone Encounter (Signed)
We received a referral from [facility] however the patient has [employer] and we will need to have the authorization from [place] as well as an authorization number to document and the number of visits authorized. [place] and they closed at 32. It looks as if we just received the referral order.called the patient and left a message on her vm that it may be best to reschedule the appointment since we do not have actual auth from [place].

## 2017-10-21 ENCOUNTER — Encounter: Payer: Self-pay | Admitting: Podiatry

## 2017-10-21 ENCOUNTER — Ambulatory Visit (INDEPENDENT_AMBULATORY_CARE_PROVIDER_SITE_OTHER): Admitting: Podiatry

## 2017-10-21 VITALS — BP 155/90 | HR 77

## 2017-10-21 DIAGNOSIS — M216X2 Other acquired deformities of left foot: Secondary | ICD-10-CM

## 2017-10-21 DIAGNOSIS — M6702 Short Achilles tendon (acquired), left ankle: Secondary | ICD-10-CM

## 2017-10-21 DIAGNOSIS — M21969 Unspecified acquired deformity of unspecified lower leg: Secondary | ICD-10-CM

## 2017-10-21 DIAGNOSIS — M659 Synovitis and tenosynovitis, unspecified: Secondary | ICD-10-CM | POA: Diagnosis not present

## 2017-10-21 NOTE — Patient Instructions (Signed)
Seen for painful feet. Noted of abnormal alignment of bones on both feet. Also both feet are turning in and putting excess weight on lateral column and causing inflammation. May use Naprosyn as tolerated. Also do daily stretch exercise for tight Achilles tendon. Will submit for Ritch brace approval through insurance co. Will call when we get the answer.

## 2017-10-21 NOTE — Progress Notes (Signed)
Subjective: 62 y.o. year old female patient presents stating that Orthotics are helping some but had to change to soft insoles. Orthotics were made in July 2017 for plantar fasciitis. There is redness on side of foot at about the Peroneus brevis insertion area near the base of 5th Metatarsal bone. On feet all day 11 hours working in 2 jobs.  Objective: Dermatologic: No abnormal findings. Vascular: Pedal pulses are all palpable. Orthopedic: Hypermobile first ray with bunion deformity bilateral. Palpable mass over 2nd and 3rd MPJ right. High arched rearfoot varus bilateral. Tight Achilles tendon L>R. Neurologic: All epicritic and tactile sensations grossly intact. Radiographic examination reveal metatarsus adductus bilateral, elevated first ray bilateral. Plantar and posterior calcaneal spur seen. Positive of subluxed 2nd MPJ right. No acute changes noted in affected area of 5th Metatarsal base left.     Assessment: Achilles tendon contracture L>R. Tenosynovitis 5th Metatarsal base left. Hypermobile first ray bilateral. Fibrosis 2nd and 3rd MPJ right palpable. Compensated rearfoot varus bilateral. Metatarsus adductus with cavovarus deformity bilateral with lateral weight shifting on left foot.  Treatment: Reviewed findings and available treatment options. Reviewed stretch exercise for tight Achilles tendon. Patient will also use her Night Splint that she has at home. May benefit from Ritch brace (CPT code [phone] and [phone]) to oppose cavovarus deforming force on left foot. Will request for prior authorization on [id number] and [id number].

## 2017-10-25 ENCOUNTER — Telehealth: Payer: Self-pay | Admitting: *Deleted

## 2017-10-25 NOTE — Telephone Encounter (Signed)
Authorization request was faxed to [employer] for Ritchie brace. Awaiting response

## 2017-11-29 ENCOUNTER — Telehealth (HOSPITAL_COMMUNITY): Payer: Self-pay | Admitting: Family Medicine

## 2017-11-29 ENCOUNTER — Encounter (HOSPITAL_COMMUNITY): Payer: Self-pay | Admitting: Emergency Medicine

## 2017-11-29 ENCOUNTER — Ambulatory Visit (HOSPITAL_COMMUNITY)
Admission: EM | Admit: 2017-11-29 | Discharge: 2017-11-29 | Disposition: A | Discharge status: Home / Self Care | Attending: Family Medicine | Admitting: Family Medicine

## 2017-11-29 DIAGNOSIS — F419 Anxiety disorder, unspecified: Secondary | ICD-10-CM

## 2017-11-29 MED ORDER — HYDROXYZINE HCL 25 MG PO TABS: 25.0000 mg | ORAL_TABLET | Freq: Four times a day (QID) | ORAL | 0 refills | Status: DC

## 2017-11-29 NOTE — ED Provider Notes (Signed)
[facility]    CSN: [id number] Arrival date & time: 11/29/17  1113     History   Chief Complaint Chief Complaint  Patient presents with  . Anxiety  . Medication Refill    HPI Jane [name] is a 62 y.o. female.   62 yo female with chronic anxiety is here for medication refill for lorazepam. Patient was seen for the same in January and was supposed to follow up with PCP. She did not. She says she is scheduled for follow up with psychiatrist. She does not want to start anti-depressant at this time and only wants lorazepam.      Past Medical History:  Diagnosis Date  . Acid reflux   . Arthritis   . Hypertension   . Reflux   . Vasculitis [facility])     Patient Active Problem List   Diagnosis Date Noted  . Plantar fasciitis, bilateral 02/29/2016  . Metatarsal deformity 02/29/2016  . Tenosynovitis of foot 02/29/2016    Past Surgical History:  Procedure Laterality Date  . COLONOSCOPY    . TUBAL LIGATION      OB History   None      Home Medications    Prior to Admission medications   Medication Sig Start Date End Date Taking? Authorizing Provider  B Complex Vitamins (VITAMIN-B COMPLEX) TABS Take 1 tablet by mouth daily.     [provider]  Cholecalciferol (D 1000) 1000 units capsule Take 1,000 Units by mouth daily.     [provider]  hydrochlorothiazide (HYDRODIURIL) 25 MG tablet Take 25 mg by mouth daily.    [provider]  LORazepam (ATIVAN) 0.5 MG tablet Take 1 tablet by mouth every 6 (six) hours as needed for anxiety.  07/19/15   [provider]  LORazepam (ATIVAN) 0.5 MG tablet Take 1 tablet (0.5 mg total) by mouth daily as needed for anxiety. 09/01/17   Wynona Luna, MD  Multiple Vitamin (MULTIVITAMIN WITH MINERALS) TABS tablet Take 1 tablet by mouth daily.    [provider]  NEXIUM 40 MG capsule TK 1 C PO QD 01/09/16   [provider]    Family History No family history on  file.  Social History Social History   Tobacco Use  . Smoking status: Never Smoker  . Smokeless tobacco: Never Used  Substance Use Topics  . Alcohol use: No    Alcohol/week: 0.0 oz  . Drug use: No     Allergies   Patient has no known allergies.   Review of Systems Review of Systems  Constitutional: Negative for activity change and appetite change.  HENT: Negative for congestion and ear pain.   Eyes: Negative for discharge and itching.  Respiratory: Negative for apnea and chest tightness.   Cardiovascular: Negative for chest pain and palpitations.  Gastrointestinal: Negative for abdominal distention and abdominal pain.  Endocrine: Negative for cold intolerance and heat intolerance.  Genitourinary: Negative for difficulty urinating and dyspareunia.  Musculoskeletal: Negative for arthralgias and back pain.  Neurological: Negative for dizziness and headaches.  Psychiatric/Behavioral:       Anxiety     Physical Exam Triage Vital Signs ED Triage Vitals [11/29/17 1228]  Enc Vitals Group     BP (!) 153/68     Pulse Rate 61     Resp 18     Temp 98.3 F (36.8 C)     Temp Source Oral     SpO2 100 %     Weight  Height      Head Circumference      Peak Flow      Pain Score 0     Pain Loc      Pain Edu?      Excl. in [place]?    No data found.  Updated Vital Signs BP (!) 153/68 (BP Location: Right Arm) Comment: Notified TIna G  Pulse 61   Temp 98.3 F (36.8 C) (Oral)   Resp 18   SpO2 100%   Visual Acuity Right Eye Distance:   Left Eye Distance:   Bilateral Distance:    Right Eye Near:   Left Eye Near:    Bilateral Near:     Physical Exam  Constitutional: She is oriented to person, place, and time. She appears well-developed and well-nourished.  HENT:  Head: Normocephalic and atraumatic.  Eyes: EOM are normal.  Neck: Normal range of motion. Neck supple.  Pulmonary/Chest: Effort normal. No respiratory distress.  Musculoskeletal: Normal range of motion.  She exhibits no edema.  Neurological: She is alert and oriented to person, place, and time.  Skin: Skin is warm and dry.  Psychiatric: She has a normal mood and affect.  She is agitated      UC Treatments / Results  Labs (all labs ordered are listed, but only abnormal results are displayed) Labs Reviewed - No data to display  EKG None Radiology No results found.  Procedures Procedures (including critical care time)  Medications Ordered in UC Medications - No data to display   Initial Impression / Assessment and Plan / UC Course  I have reviewed the triage vital signs and the nursing notes.  Pertinent labs & imaging results that were available during my care of the patient were reviewed by me and considered in my medical decision making (see chart for details).     1. Chronic anxiety: will not prescribe lorazepam at this time due to risk of medication. I discussed risks with patient, including overdose, abuse, misuse, and death. She is agitated and wants medication. I advised she follow up with her PCP or psychiatrist.  Final Clinical Impressions(s) / UC Diagnoses   Final diagnoses:  None    ED Discharge Orders    None       Controlled Substance Prescriptions [place] Controlled Substance Registry consulted? Not Applicable   Dannielle Huh, DO 11/29/17 1320

## 2017-11-29 NOTE — Telephone Encounter (Signed)
Pt called requesting something for her anxiety, she was seen earlier today by Dr. Manus Rudd. Spoke with her and okay to send hydroxyzine to pharmacy

## 2017-11-29 NOTE — ED Triage Notes (Addendum)
Pt c/o of being under a lot of stress recently, working two jobs, anxiety. Usually takes ativan PRN but is out of it.

## 2017-12-17 ENCOUNTER — Other Ambulatory Visit: Payer: Self-pay | Admitting: *Deleted

## 2017-12-17 DIAGNOSIS — R1013 Epigastric pain: Secondary | ICD-10-CM

## 2017-12-30 ENCOUNTER — Ambulatory Visit
Admission: RE | Admit: 2017-12-30 | Discharge: 2017-12-30 | Disposition: A | Discharge status: Home / Self Care | Source: Ambulatory Visit | Attending: *Deleted | Admitting: *Deleted

## 2017-12-30 DIAGNOSIS — R1013 Epigastric pain: Secondary | ICD-10-CM

## 2018-01-01 ENCOUNTER — Other Ambulatory Visit

## 2018-01-08 DIAGNOSIS — F419 Anxiety disorder, unspecified: Secondary | ICD-10-CM | POA: Insufficient documentation

## 2018-01-08 HISTORY — DX: Anxiety disorder, unspecified: F41.9

## 2018-01-14 ENCOUNTER — Encounter: Payer: Self-pay | Admitting: Gastroenterology

## 2018-01-29 ENCOUNTER — Encounter: Payer: Self-pay | Admitting: Gastroenterology

## 2018-01-29 ENCOUNTER — Encounter (INDEPENDENT_AMBULATORY_CARE_PROVIDER_SITE_OTHER): Payer: Self-pay

## 2018-01-29 ENCOUNTER — Ambulatory Visit (INDEPENDENT_AMBULATORY_CARE_PROVIDER_SITE_OTHER): Admitting: Gastroenterology

## 2018-01-29 VITALS — BP 158/88 | HR 72 | Ht 64.5 in | Wt 214.0 lb

## 2018-01-29 DIAGNOSIS — K219 Gastro-esophageal reflux disease without esophagitis: Secondary | ICD-10-CM | POA: Diagnosis not present

## 2018-01-29 DIAGNOSIS — R1013 Epigastric pain: Secondary | ICD-10-CM | POA: Diagnosis not present

## 2018-01-29 MED ORDER — PANTOPRAZOLE SODIUM 40 MG PO TBEC: 40.0000 mg | DELAYED_RELEASE_TABLET | Freq: Every day | ORAL | 5 refills | Status: DC

## 2018-01-29 NOTE — Progress Notes (Signed)
Reviewed and agree with documentation and assessment and plan. K. Veena Saleah Rishel , MD   

## 2018-01-29 NOTE — Patient Instructions (Addendum)
If you are age 64 or older, your body mass index should be between 23-30. Your Body mass index is 36.17 kg/m. If this is out of the aforementioned range listed, please consider follow up with your Primary Care Provider.  If you are age 36 or younger, your body mass index should be between 19-25. Your Body mass index is 36.17 kg/m. If this is out of the aformentioned range listed, please consider follow up with your Primary Care Provider.   We have sent the following medications to your pharmacy for you to pick up at your convenience: Pantoprazole 40 mg: Take daily  Please discontinue over the counter Nexium.  We will request records from Dr. Collene Mares and Dr. Lamar Sprinkles.  Please call back in 5 weeks and ask for Patty to give [place] an update on how you are feeling.  Thank you.

## 2018-01-29 NOTE — Progress Notes (Addendum)
01/29/2018 Jane [name] [id number] Feb 28, 1956   HISTORY OF PRESENT ILLNESS:  This is a pleasant 62 year old female who is new to our practice.  She is here today at the request of her PCP, Dr. Lamar Sprinkles, for evaluation of GERD and epigastric abdominal pain.  Reports bloating in her upper abdomen and epigastric discomfort.  Denies dysphagia but reports what sounds like globus sensation on occasion.  No unintentional weight loss.  Symptoms have been present for about the past 1-2 months.  She tells me that she is currently taking OTC Nexium.  Previously had been on prescription Nexium and felt better.  Had similar symptoms in the past.  Had EGD at [facility] by Dr. Darrel Hoover in 04/2015 at which time she was found to have a small hiatal hernia, a suspected small fundic gland polyp, and some erythema in the duodenum.  Gastric biopsies actually just showed oxyntic mucosa with PPI effect and foveolar hyperplasia.  Duodenal biopsies were normal.  Biopsies results are in [place].  She does admit that she has a lot of anxiety and is asking if that can contribute to her symptoms.  Takes naproxen about once a week for pain in her feet.    She tells me that she had a colonoscopy in 2010 by Dr. Collene Mares that she says was normal and repeat was recommended in 10 years from that time.  Also tells me that she had recent labs that were normal and hemoccult that was negative by her PCP.     Past Medical History:  Diagnosis Date  . Acid reflux   . Arthritis   . Hypertension   . Reflux   . Vasculitis [facility])    Past Surgical History:  Procedure Laterality Date  . COLONOSCOPY    . TUBAL LIGATION      reports that she has never smoked. She has never used smokeless tobacco. She reports that she does not drink alcohol or use drugs. family history includes Alcoholism in her mother; Diabetes in her maternal uncle; Lung disease in her maternal grandmother. No Known Allergies    Outpatient Encounter Medications as of  01/29/2018  Medication Sig  . B Complex Vitamins (VITAMIN-B COMPLEX) TABS Take 1 tablet by mouth daily.   . Cholecalciferol (D 1000) 1000 units capsule Take 1,000 Units by mouth daily.   Marland Kitchen esomeprazole (NEXIUM) 20 MG capsule Take 20 mg by mouth daily at 12 noon.  . hydrochlorothiazide (HYDRODIURIL) 25 MG tablet Take 25 mg by mouth daily.  . hydrOXYzine (ATARAX/VISTARIL) 25 MG tablet Take 1 tablet (25 mg total) by mouth every 6 (six) hours.  Marland Kitchen lisinopril-hydrochlorothiazide (PRINZIDE,ZESTORETIC) 20-12.5 MG tablet Take 1 tablet by mouth daily.  Marland Kitchen MAGNESIUM-CALCIUM-FOLIC ACID [name] Take by mouth.  . Multiple Vitamin (MULTIVITAMIN WITH MINERALS) TABS tablet Take 1 tablet by mouth daily.  . naproxen (EC NAPROSYN) 500 MG EC tablet naproxen 500 mg tablet,delayed release  take one tablet one a day  . [DISCONTINUED] LORazepam (ATIVAN) 0.5 MG tablet Take 1 tablet by mouth every 6 (six) hours as needed for anxiety.   . [DISCONTINUED] LORazepam (ATIVAN) 0.5 MG tablet Take 1 tablet (0.5 mg total) by mouth daily as needed for anxiety.  . [DISCONTINUED] NEXIUM 40 MG capsule TK 1 C [name] QD   No facility-administered encounter medications on file as of 01/29/2018.      REVIEW OF SYSTEMS  : All other systems reviewed and negative except where noted in the History of Present Illness.  PHYSICAL EXAM: BP (!) 158/88   Pulse 72   Ht 5' 4.5" (1.638 m)   Wt 214 lb (97.1 kg)   BMI 36.17 kg/m  General: Well developed female in no acute distress Head: Normocephalic and atraumatic Eyes:  Sclerae anicteric, conjunctiva pink. Ears: Normal auditory acuity Lungs: Clear throughout to auscultation; no increased WOB. Heart: Regular rate and rhythm; no M/R/G. Abdomen: Soft, non-distended.  BS present.  Non-tender. Musculoskeletal: Symmetrical with no gross deformities  Skin: No lesions on visible extremities Extremities: No edema  Neurological: Alert oriented x 4, grossly non-focal Psychological:  Alert and  cooperative. Normal mood and affect  ASSESSMENT AND PLAN: *GERD and epigastric pain, dyspepsia:  Occasional globus sensation.  Has history of small hiatal hernia on EGD in 2016.  Has been on OTC PPI.  Previously did well with prescription PPI.  No alarm symptoms.  Will start pantoprazole 40 mg daily in place of her OTC PPI.  She will call in 4-6 weeks with an update on her symptoms, ask for Patty.  At that time if still having symptoms then may consider UGI.  **Will request colonoscopy records from Dr. Collene Mares and recent labs from her PCP.  **Addendum: Received colonoscopy records from Dr. Collene Mares.  Last colonoscopy was January 2010 at which time the study was normal with a 10-year colonoscopy recall.   CC:  Jasmine December, NP

## 2018-02-05 IMAGING — CR DG CHEST 2V
2 series · 2 of 2 positions shown · non-contrast
Comparison: Chest CT 08/21/2015.  Radiographs 04/09/2015

CLINICAL DATA: Anxiety.

EXAM:
CHEST  2 VIEW

[chest pa]
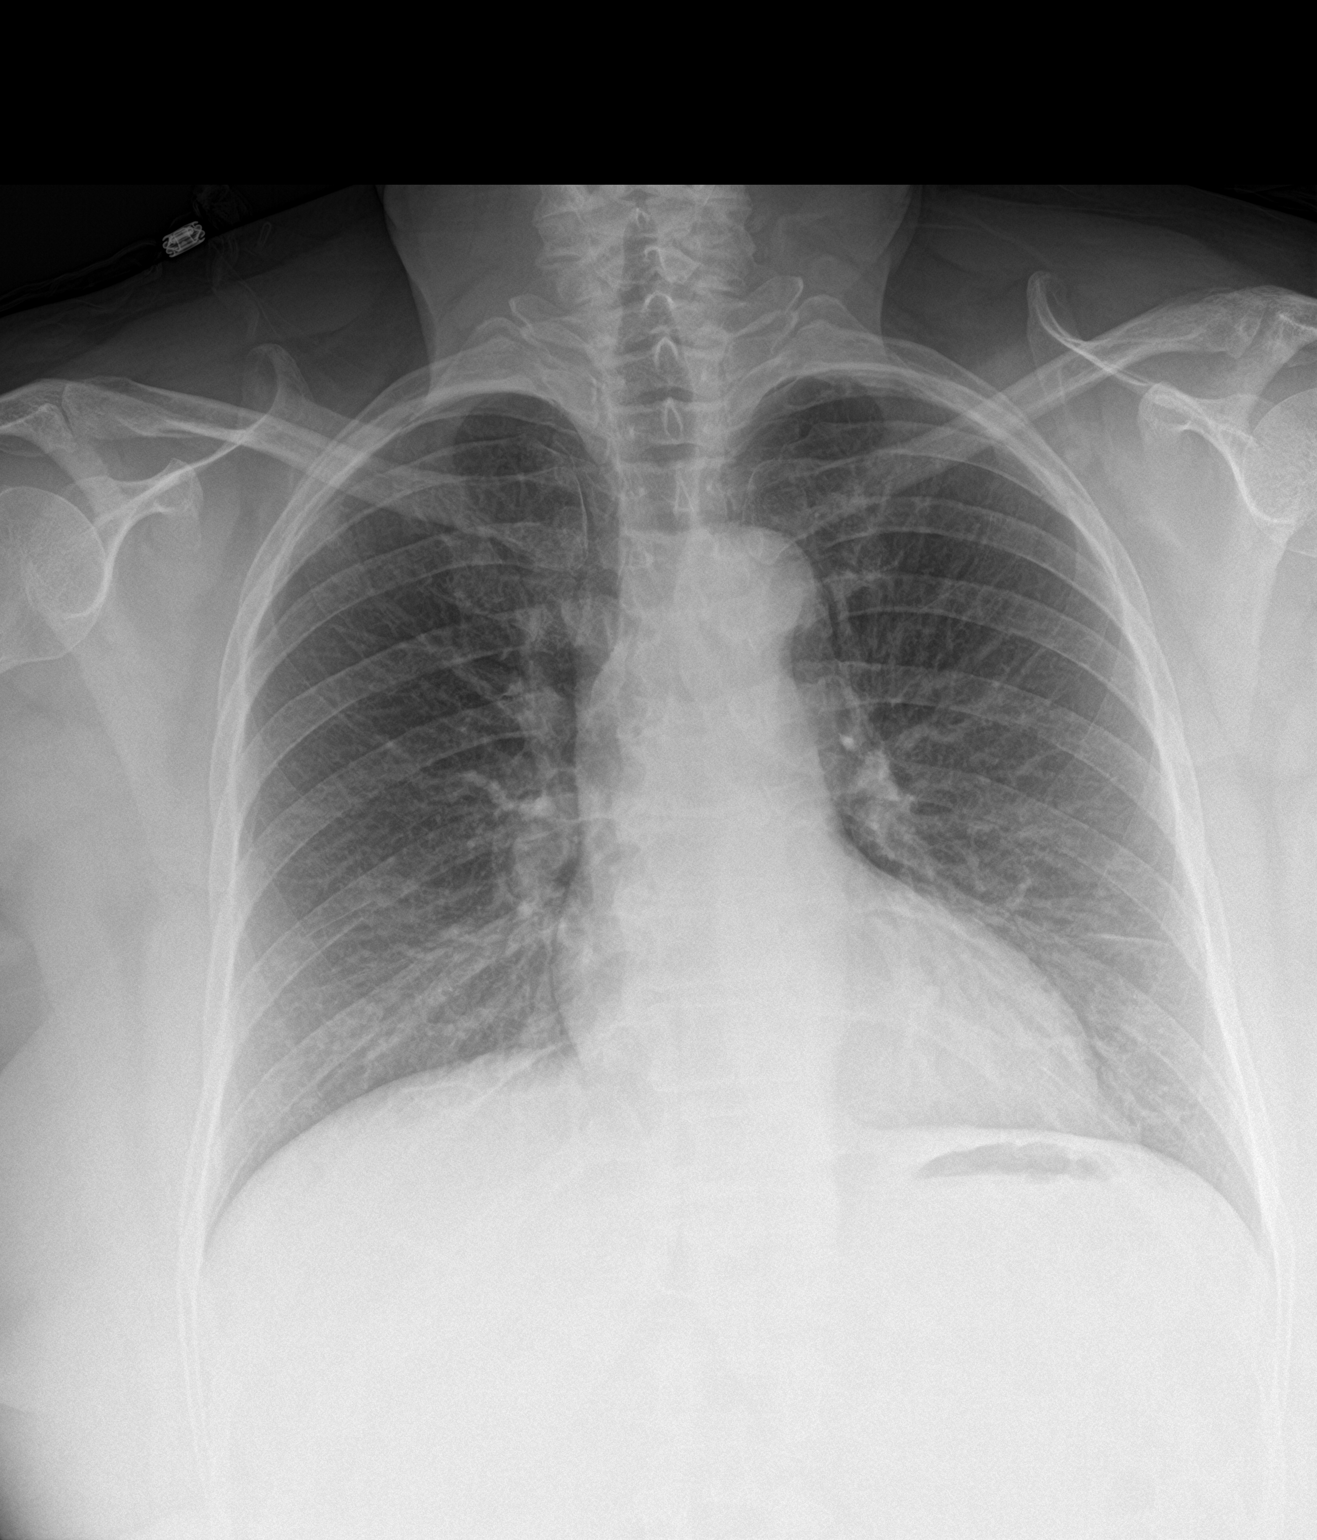

[chest lat]
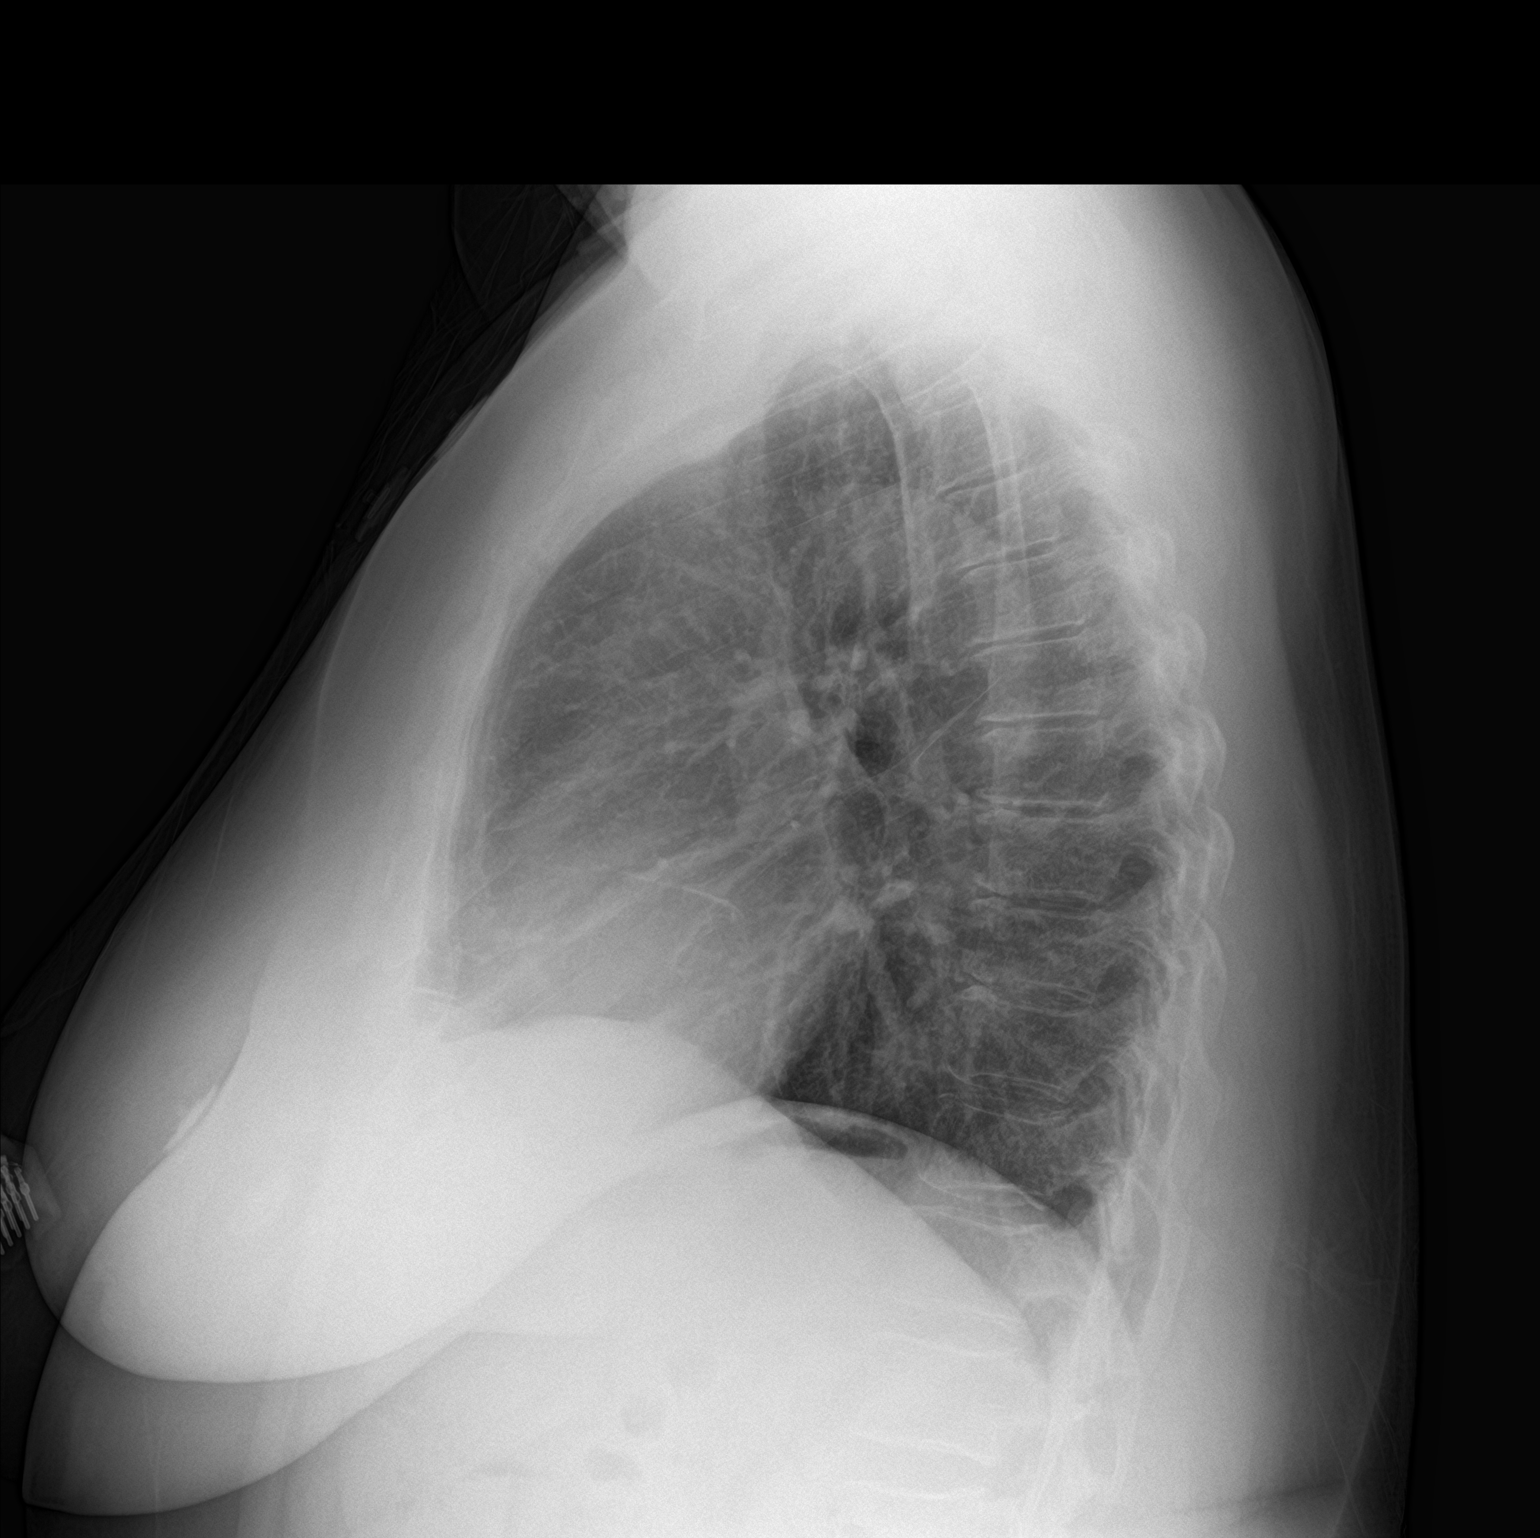

[2 of 2 positions shown; findings below may reference images not displayed]

FINDINGS: The cardiomediastinal contours are normal. Subsegmental atelectasis
at the left lung base. Pulmonary vasculature is normal. No
consolidation, pleural effusion, or pneumothorax. No acute osseous
abnormalities are seen. Overlying monitoring devices project over
the chest.
IMPRESSION: Subsegmental left basilar atelectasis. Otherwise no acute
abnormality.

## 2018-04-22 ENCOUNTER — Encounter (HOSPITAL_COMMUNITY): Payer: Self-pay | Admitting: Emergency Medicine

## 2018-04-22 ENCOUNTER — Emergency Department (HOSPITAL_COMMUNITY)

## 2018-04-22 ENCOUNTER — Emergency Department (HOSPITAL_COMMUNITY)
Admission: EM | Admit: 2018-04-22 | Discharge: 2018-04-22 | Discharge status: Against Medical Advice | Attending: Emergency Medicine | Admitting: Emergency Medicine

## 2018-04-22 DIAGNOSIS — R002 Palpitations: Secondary | ICD-10-CM | POA: Diagnosis not present

## 2018-04-22 DIAGNOSIS — Z5321 Procedure and treatment not carried out due to patient leaving prior to being seen by health care provider: Secondary | ICD-10-CM | POA: Diagnosis not present

## 2018-04-22 LAB — CBC
HCT: 43.9 % (ref 36.0–46.0)
Hemoglobin: 14.4 g/dL (ref 12.0–15.0)
MCH: 30.3 pg (ref 26.0–34.0)
MCHC: 32.8 g/dL (ref 30.0–36.0)
MCV: 92.2 fL (ref 78.0–100.0)
Platelets: 208 10*3/uL (ref 150–400)
RBC: 4.76 MIL/uL (ref 3.87–5.11)
RDW: 12.9 % (ref 11.5–15.5)
WBC: 10.6 10*3/uL — AB (ref 4.0–10.5)

## 2018-04-22 LAB — BASIC METABOLIC PANEL
Anion gap: 13 (ref 5–15)
BUN: 13 mg/dL (ref 8–23)
CHLORIDE: 100 mmol/L (ref 98–111)
CO2: 26 mmol/L (ref 22–32)
Calcium: 10 mg/dL (ref 8.9–10.3)
Creatinine, Ser: 0.78 mg/dL (ref 0.44–1.00)
Glucose, Bld: 102 mg/dL — ABNORMAL HIGH (ref 70–99)
POTASSIUM: 4.2 mmol/L (ref 3.5–5.1)
SODIUM: 139 mmol/L (ref 135–145)

## 2018-04-22 LAB — I-STAT TROPONIN, ED: Troponin i, poc: 0.03 ng/mL (ref 0.00–0.08)

## 2018-04-22 NOTE — ED Triage Notes (Signed)
Patient to ED c/o sudden onset palpitations and lightheadedness x 1 hour ago while at work. She reports hx anxiety, took hydroxyzine at time of symptom onset with some relief. She also endorses fatigue and intermittent abdominal cramping. Denies N/V/D, no shortness of breath. Resp e/u, skin warm/dry.

## 2018-04-22 NOTE — ED Notes (Signed)
Pt reports she is unable to wait any longer. RN explained wait time to patient, and attempted to convince pt to stay. Pt left without being seen

## 2018-04-23 ENCOUNTER — Emergency Department (HOSPITAL_COMMUNITY)
Admission: EM | Admit: 2018-04-23 | Discharge: 2018-04-23 | Disposition: A | Discharge status: Home / Self Care | Attending: Emergency Medicine | Admitting: Emergency Medicine

## 2018-04-23 ENCOUNTER — Encounter (HOSPITAL_COMMUNITY): Payer: Self-pay | Admitting: Emergency Medicine

## 2018-04-23 DIAGNOSIS — I10 Essential (primary) hypertension: Secondary | ICD-10-CM | POA: Diagnosis not present

## 2018-04-23 DIAGNOSIS — Z79899 Other long term (current) drug therapy: Secondary | ICD-10-CM | POA: Insufficient documentation

## 2018-04-23 DIAGNOSIS — R002 Palpitations: Secondary | ICD-10-CM | POA: Insufficient documentation

## 2018-04-23 LAB — I-STAT TROPONIN, ED: Troponin i, poc: 0.02 ng/mL (ref 0.00–0.08)

## 2018-04-23 NOTE — ED Triage Notes (Signed)
Pt reports she was feeling poorly all day, "on top of that I was anxious b/c I felt poorly...  It could be acid refux... I just want to be checked out."

## 2018-04-23 NOTE — ED Notes (Signed)
Reviewed d/c instructions with pt, who verbalized understanding and had no outstanding questions. Pt departed in NAD, refused use of wheelchair.   

## 2018-04-23 NOTE — Discharge Instructions (Addendum)
Your work-up in the emergency department today was reassuring.  We advise follow-up with your primary care doctor as well as your gastroenterologist.

## 2018-04-23 NOTE — ED Provider Notes (Signed)
[place] EMERGENCY DEPARTMENT Provider Note   CSN: [id number] Arrival date & time: 04/23/18  8916     History   Chief Complaint Chief Complaint  Patient presents with  . "feels like heart racing"  . Anxiety    HPI Jane [name] is a 62 y.o. female.  62 year old female with a history of hypertension, acid reflux, anxiety presents to the emergency department for palpitations.  She states that palpitations began while she was at work with associated lightheadedness.  She noted some cramping upper abdominal discomfort which she believes was due to persistent reflux symptoms.  Notes some slight increase in belching.  No vomiting or discrete chest pain.  No recent fevers.  Has been having normal bowel movements.  Took some hydroxyzine at the time of symptom onset with relief.  Presented to the emergency department earlier in the afternoon; left without being seen.  Returned because she was concerned and wanted "to be checked out".  The history is provided by the patient. No language interpreter was used.  Anxiety     Past Medical History:  Diagnosis Date  . Acid reflux   . Arthritis   . Hypertension   . Reflux   . Vasculitis [facility])     Patient Active Problem List   Diagnosis Date Noted  . Gastroesophageal reflux disease 01/29/2018  . Abdominal pain, epigastric 01/29/2018  . Plantar fasciitis, bilateral 02/29/2016  . Metatarsal deformity 02/29/2016  . Tenosynovitis of foot 02/29/2016    Past Surgical History:  Procedure Laterality Date  . COLONOSCOPY    . TUBAL LIGATION       OB History   None      Home Medications    Prior to Admission medications   Medication Sig Start Date End Date Taking? Authorizing Provider  B Complex Vitamins (VITAMIN-B COMPLEX) TABS Take 1 tablet by mouth daily.     [provider]  Cholecalciferol (D 1000) 1000 units capsule Take 1,000 Units by mouth daily.     [provider]  esomeprazole (NEXIUM)  20 MG capsule Take 20 mg by mouth daily at 12 noon.    [provider]  hydrochlorothiazide (HYDRODIURIL) 25 MG tablet Take 25 mg by mouth daily.    [provider]  hydrOXYzine (ATARAX/VISTARIL) 25 MG tablet Take 1 tablet (25 mg total) by mouth every 6 (six) hours. 11/29/17   Moss, Amber, DO  lisinopril-hydrochlorothiazide (PRINZIDE,ZESTORETIC) 20-12.5 MG tablet Take 1 tablet by mouth daily.    [provider]  [place] ACID PO Take by mouth.    [provider]  Multiple Vitamin (MULTIVITAMIN WITH MINERALS) TABS tablet Take 1 tablet by mouth daily.    [provider]  naproxen (EC NAPROSYN) 500 MG EC tablet naproxen 500 mg tablet,delayed release  take one tablet one a day    [provider]  pantoprazole (PROTONIX) 40 MG tablet Take 1 tablet (40 mg total) by mouth daily. 01/29/18   Zehr, Laban Emperor, PA-C    Family History Family History  Problem Relation Age of Onset  . Alcoholism Mother   . Lung disease Maternal Grandmother   . Diabetes Maternal Uncle   . Colon cancer Neg Hx   . Stomach cancer Neg Hx     Social History Social History   Tobacco Use  . Smoking status: Never Smoker  . Smokeless tobacco: Never Used  Substance Use Topics  . Alcohol use: No    Alcohol/week: 0.0 standard drinks  . Drug use:  No     Allergies   Patient has no known allergies.   Review of Systems Review of Systems Ten systems reviewed and are negative for acute change, except as noted in the HPI.    Physical Exam Updated Vital Signs BP (!) 167/90 (BP Location: Right Arm)   Pulse 98   Temp 98.7 F (37.1 C) (Oral)   Resp 16   Ht 5' 4.5" (1.638 m)   Wt 99.3 kg   SpO2 98%   BMI 37.01 kg/m   Physical Exam  Constitutional: She is oriented to person, place, and time. She appears well-developed and well-nourished. No distress.  Nontoxic appearing and in NAD  HENT:  Head: Normocephalic and atraumatic.  Eyes: Conjunctivae and  EOM are normal. No scleral icterus.  Neck: Normal range of motion.  Cardiovascular: Normal rate, regular rhythm and intact distal pulses.  Pulmonary/Chest: Effort normal. No stridor. No respiratory distress. She has no wheezes. She has no rales.  Respirations even and unlabored. Lungs CTAB.  Abdominal: Soft. She exhibits no distension and no mass. There is no tenderness. There is no guarding.  Soft, nontender abdomen.   Musculoskeletal: Normal range of motion.  Neurological: She is alert and oriented to person, place, and time. She exhibits normal muscle tone. Coordination normal.  Skin: Skin is warm and dry. No rash noted. She is not diaphoretic. No erythema. No pallor.  Psychiatric: She has a normal mood and affect. Her behavior is normal.  Nursing note and vitals reviewed.    ED Treatments / Results  Labs (all labs ordered are listed, but only abnormal results are displayed) Labs Reviewed  I-STAT TROPONIN, ED    EKG EKG Interpretation  Date/Time:  Wednesday April 23 2018 03:23:52 EDT Ventricular Rate:  93 PR Interval:  162 QRS Duration: 80 QT Interval:  350 QTC Calculation: 435 R Axis:   22 Text Interpretation:  Normal sinus rhythm Cannot rule out Anterior infarct , age undetermined Abnormal ECG No significant change was found Confirmed by Jola Schmidt [phone]) on 04/23/2018 5:14:23 AM   Radiology Dg Chest 2 View  Result Date: 04/22/2018 CLINICAL DATA:  Palpitations.  Lightheadedness. EXAM: CHEST - 2 VIEW COMPARISON:  04/26/2017 chest radiograph. FINDINGS: Stable cardiomediastinal silhouette with normal heart size. No pneumothorax. No pleural effusion. Stable minimal scarring in the lingula. No pulmonary edema. No acute consolidative airspace disease. IMPRESSION: No active cardiopulmonary disease. Electronically Signed   By: Ilona Sorrel M.D.   On: 04/22/2018 15:49    Procedures Procedures (including critical care time)  Medications Ordered in ED Medications - No data  to display   Initial Impression / Assessment and Plan / ED Course  I have reviewed the triage vital signs and the nursing notes.  Pertinent labs & imaging results that were available during my care of the patient were reviewed by me and considered in my medical decision making (see chart for details).     62 year old female presenting for palpitations and lightheadedness which began while at work.  Symptoms were associated with mild upper abdominal cramping and increased belching.  She believes that her reflux was acting up, prompting worsening of her anxiety.  She did have relief of symptoms following hydroxyzine.  Patient with reassuring physical exam.  Her cardiac work-up from prior ED visit today has been reassuring.  Repeat troponin remains negative.  Her EKG is nonischemic and chest x-ray does not suggest acute cardiopulmonary abnormality.  Plan for continued outpatient primary care follow-up.  Have also encouraged follow-up  with her gastroenterologist.  Return precautions discussed and provided. Patient discharged in stable condition with no unaddressed concerns.   Final Clinical Impressions(s) / ED Diagnoses   Final diagnoses:  Palpitations    ED Discharge Orders    None       Antonietta Breach, PA-C 04/23/18 Karin Golden, MD 04/23/18 [phone]

## 2018-06-25 ENCOUNTER — Encounter (HOSPITAL_COMMUNITY): Payer: Self-pay | Admitting: Emergency Medicine

## 2018-06-25 ENCOUNTER — Ambulatory Visit (HOSPITAL_COMMUNITY)
Admission: EM | Admit: 2018-06-25 | Discharge: 2018-06-25 | Disposition: A | Discharge status: Home / Self Care | Attending: Family Medicine | Admitting: Family Medicine

## 2018-06-25 DIAGNOSIS — S46911A Strain of unspecified muscle, fascia and tendon at shoulder and upper arm level, right arm, initial encounter: Secondary | ICD-10-CM | POA: Diagnosis not present

## 2018-06-25 MED ORDER — NAPROXEN 500 MG PO TBEC: DELAYED_RELEASE_TABLET | ORAL | 0 refills | Status: DC

## 2018-06-25 NOTE — ED Triage Notes (Signed)
Pt sts right upper arm and shoulder pain after fall yesterday

## 2018-07-02 NOTE — ED Provider Notes (Signed)
[place]   [id number] 06/25/18 Arrival Time: 1132  ASSESSMENT & PLAN:  1. Shoulder strain, right, initial encounter    No indications for imaging at this time. No sign of shoulder dislocation. Discussed.  Meds ordered this encounter  Medications  . naproxen (EC NAPROSYN) 500 MG EC tablet    Sig: naproxen 500 mg tablet,delayed release  take one tablet one a day    Dispense:  30 tablet    Refill:  0    Follow-up Information    [facility] In 1 week.   Specialty:  Urgent Care Why:  Sooner if needed. Contact information: [place] [phone]         Encouraged to work on ROM as tolerated. Expect gradual improvement.  Reviewed expectations re: course of current medical issues. Questions answered. Outlined signs and symptoms indicating need for more acute intervention. Patient verbalized understanding. After Visit Summary given.  SUBJECTIVE: History from: patient. Jane [name] is a 62 y.o. female who reports intermittent mild to moderate pain of her right shoulder; described as aching without radiation. Injury/trama: reports catching herself from falling backwards yesterday before pain started; no direct trauma to shoulder reported Symptoms have progressed to a point and plateaued since beginning. Relieved by: rest. Worsened by: movement. Associated symptoms: none reported. Extremity sensation changes or weakness: none. Self treatment: has not tried OTCs for relief of pain. History of similar: no.  Past Surgical History:  Procedure Laterality Date  . COLONOSCOPY    . TUBAL LIGATION       ROS: As per HPI.   OBJECTIVE:  Vitals:   06/25/18 1202  BP: (!) 150/75  Pulse: 70  Resp: 18  Temp: 98.2 F (36.8 C)  TempSrc: Oral  SpO2: 99%    General appearance: alert; no distress Extremities: . RUE: warm and well perfused; poorly localized mild tenderness over right  shoulder but without specific bony tenderness; without gross deformities; with no swelling; with no bruising; ROM: normal but with some discomfort CV: brisk extremity capillary refill of RUE; 2+ radial pulse of RUE. Skin: warm and dry; no visible rashes Neurologic: gait normal; normal reflexes of RUE; normal sensation of RUE; normal strength of RUE Psychological: alert and cooperative; normal mood and affect  No Known Allergies  Past Medical History:  Diagnosis Date  . Acid reflux   . Arthritis   . Hypertension   . Reflux   . Vasculitis ([place])    Social History   Socioeconomic History  . Marital status: Divorced    Spouse name: Not on file  . Number of children: Not on file  . Years of education: Not on file  . Highest education level: Not on file  Occupational History  . Occupation: Chartered certified accountant  Social Needs  . Financial resource strain: Not on file  . Food insecurity:    Worry: Not on file    Inability: Not on file  . Transportation needs:    Medical: Not on file    Non-medical: Not on file  Tobacco Use  . Smoking status: Never Smoker  . Smokeless tobacco: Never Used  Substance and Sexual Activity  . Alcohol use: No    Alcohol/week: 0.0 standard drinks  . Drug use: No  . Sexual activity: Not on file  Lifestyle  . Physical activity:    Days per week: Not on file    Minutes per session: Not on file  . Stress:  Not on file  Relationships  . Social connections:    Talks on phone: Not on file    Gets together: Not on file    Attends religious service: Not on file    Active member of club or organization: Not on file    Attends meetings of clubs or organizations: Not on file    Relationship status: Not on file  Other Topics Concern  . Not on file  Social History Narrative  . Not on file   Family History  Problem Relation Age of Onset  . Alcoholism Mother   . Lung disease Maternal Grandmother   . Diabetes Maternal Uncle   . Colon cancer Neg Hx   . Stomach  cancer Neg Hx    Past Surgical History:  Procedure Laterality Date  . COLONOSCOPY    . TUBAL LIGATION        Vanessa Kick, MD 07/02/18 [phone]

## 2018-07-09 ENCOUNTER — Ambulatory Visit (HOSPITAL_COMMUNITY)
Admission: EM | Admit: 2018-07-09 | Discharge: 2018-07-09 | Disposition: A | Discharge status: Home / Self Care | Attending: Family Medicine | Admitting: Family Medicine

## 2018-07-09 ENCOUNTER — Encounter (HOSPITAL_COMMUNITY): Payer: Self-pay | Admitting: Emergency Medicine

## 2018-07-09 DIAGNOSIS — K0889 Other specified disorders of teeth and supporting structures: Secondary | ICD-10-CM

## 2018-07-09 MED ORDER — PENICILLIN V POTASSIUM 500 MG PO TABS: 500.0000 mg | ORAL_TABLET | Freq: Four times a day (QID) | ORAL | 0 refills | Status: AC

## 2018-07-09 NOTE — ED Triage Notes (Signed)
Pt states shes been having tooth pain on the L upper back side, pt does not see a dentist. Pt was here last Tuesday for arm pain, and wanted someone to check on it as well.

## 2018-07-09 NOTE — Discharge Instructions (Signed)
Your shoulder appears to be improving. We will go ahead and treat your dental infection with penicillin You can take the naproxen you have for pain inflammation I am giving you some resources for dental clinics

## 2018-07-10 NOTE — ED Provider Notes (Signed)
[place]    CSN: [id number] Arrival date & time: 07/09/18  1040     History   Chief Complaint Chief Complaint  Patient presents with  . Arm Pain  . Dental Pain    HPI Makenah Karas is a 62 y.o. female.   Pt is a 62 year old female that presents with recheck of the right shoulder. She was seen here and treated last Tuesday. She has been taking naproxen and seen improvement in pain and range of motion.  She is also here for dental pain.  The dental pain is located to the left upper gum area.  This started after eating and she believes a piece of her tooth was broken off.  Now she is having gingival swelling and mild left facial swelling.  She denies any fever, chills, trouble swallowing or trismus.  She has plans to follow-up with a dentist.  ROS per HPI    Arm Pain   Dental Pain    Past Medical History:  Diagnosis Date  . Acid reflux   . Arthritis   . Hypertension   . Reflux   . Vasculitis [facility])     Patient Active Problem List   Diagnosis Date Noted  . Gastroesophageal reflux disease 01/29/2018  . Abdominal pain, epigastric 01/29/2018  . Plantar fasciitis, bilateral 02/29/2016  . Metatarsal deformity 02/29/2016  . Tenosynovitis of foot 02/29/2016    Past Surgical History:  Procedure Laterality Date  . COLONOSCOPY    . TUBAL LIGATION      OB History   None      Home Medications    Prior to Admission medications   Medication Sig Start Date End Date Taking? Authorizing Provider  B Complex Vitamins (VITAMIN-B COMPLEX) TABS Take 1 tablet by mouth daily.     [provider]  Cholecalciferol (D 1000) 1000 units capsule Take 1,000 Units by mouth daily.     [provider]  esomeprazole (NEXIUM) 20 MG capsule Take 20 mg by mouth daily at 12 noon.    [provider]  hydrochlorothiazide (HYDRODIURIL) 25 MG tablet Take 25 mg by mouth daily.    [provider]  hydrOXYzine (ATARAX/VISTARIL) 25 MG tablet Take 1  tablet (25 mg total) by mouth every 6 (six) hours. 11/29/17   Moss, Amber, DO  lisinopril-hydrochlorothiazide (PRINZIDE,ZESTORETIC) 20-12.5 MG tablet Take 1 tablet by mouth daily.    [provider]  [place] ACID PO Take by mouth.    [provider]  Multiple Vitamin (MULTIVITAMIN WITH MINERALS) TABS tablet Take 1 tablet by mouth daily.    [provider]  naproxen (EC NAPROSYN) 500 MG EC tablet naproxen 500 mg tablet,delayed release  take one tablet one a day 06/25/18   Vanessa Kick, MD  pantoprazole (PROTONIX) 40 MG tablet Take 1 tablet (40 mg total) by mouth daily. 01/29/18   Zehr, Janett Billow D, PA-C  penicillin v potassium (VEETID) 500 MG tablet Take 1 tablet (500 mg total) by mouth 4 (four) times daily for 10 days. 07/09/18 07/19/18  Orvan July, NP    Family History Family History  Problem Relation Age of Onset  . Alcoholism Mother   . Lung disease Maternal Grandmother   . Diabetes Maternal Uncle   . Colon cancer Neg Hx   . Stomach cancer Neg Hx     Social History Social History   Tobacco Use  . Smoking status: Never Smoker  . Smokeless tobacco: Never Used  Substance Use Topics  .  Alcohol use: No    Alcohol/week: 0.0 standard drinks  . Drug use: No     Allergies   Patient has no known allergies.   Review of Systems Review of Systems   Physical Exam Triage Vital Signs ED Triage Vitals  Enc Vitals Group     BP 07/09/18 1149 140/77     Pulse Rate 07/09/18 1149 63     Resp 07/09/18 1149 16     Temp 07/09/18 1149 97.9 F (36.6 C)     Temp src --      SpO2 07/09/18 1149 100 %     Weight --      Height --      Head Circumference --      Peak Flow --      Pain Score 07/09/18 1150 0     Pain Loc --      Pain Edu? --      Excl. in [place]? --    No data found.  Updated Vital Signs BP 140/77   Pulse 63   Temp 97.9 F (36.6 C)   Resp 16   SpO2 100%   Visual Acuity Right Eye Distance:   Left Eye Distance:     Bilateral Distance:    Right Eye Near:   Left Eye Near:    Bilateral Near:     Physical Exam  Constitutional: She appears well-developed and well-nourished.  HENT:  Head: Normocephalic.  Mouth/Throat:    Gingival swelling to left upper molar area, erythema with dental fracture.   Eyes: Conjunctivae are normal.  Pulmonary/Chest: Effort normal.  Musculoskeletal: Normal range of motion. She exhibits no edema, tenderness or deformity.  Good range of motion of right shoulder.  Nontender to palpation.  Neurological: She is alert.  Skin: Skin is warm and dry.  Psychiatric: She has a normal mood and affect.  Nursing note and vitals reviewed.    UC Treatments / Results  Labs (all labs ordered are listed, but only abnormal results are displayed) Labs Reviewed - No data to display  EKG None  Radiology No results found.  Procedures Procedures (including critical care time)  Medications Ordered in UC Medications - No data to display  Initial Impression / Assessment and Plan / UC Course  I have reviewed the triage vital signs and the nursing notes.  Pertinent labs & imaging results that were available during my care of the patient were reviewed by me and considered in my medical decision making (see chart for details).     Right shoulder-patient's pain and range of motion has improved in the right shoulder.  Instructed to continue the naproxen as needed.  Dental infection-patient has gingival swelling, erythema to left upper molar area with fractured tooth. We will go ahead and treat patient with penicillin for dental infection She has plans to follow-up with a dentist in the next week or so Final Clinical Impressions(s) / UC Diagnoses   Final diagnoses:  Pain, dental     Discharge Instructions     Your shoulder appears to be improving. We will go ahead and treat your dental infection with penicillin You can take the naproxen you have for pain inflammation I am  giving you some resources for dental clinics    ED Prescriptions    Medication Sig Dispense Auth. Provider   penicillin v potassium (VEETID) 500 MG tablet Take 1 tablet (500 mg total) by mouth 4 (four) times daily for 10 days. 40 tablet Orvan July, NP  Controlled Substance Prescriptions  Controlled Substance Registry consulted? Not Applicable   Orvan July, NP 07/10/18 1700

## 2018-09-25 ENCOUNTER — Encounter: Payer: Self-pay | Admitting: Gastroenterology

## 2018-10-21 DIAGNOSIS — Z1231 Encounter for screening mammogram for malignant neoplasm of breast: Secondary | ICD-10-CM | POA: Diagnosis not present

## 2018-12-21 ENCOUNTER — Encounter (HOSPITAL_COMMUNITY): Payer: Self-pay

## 2018-12-21 ENCOUNTER — Other Ambulatory Visit: Payer: Self-pay

## 2018-12-21 ENCOUNTER — Ambulatory Visit (HOSPITAL_COMMUNITY)
Admission: EM | Admit: 2018-12-21 | Discharge: 2018-12-21 | Disposition: A | Discharge status: Home / Self Care | Payer: Medicare Other | Attending: Emergency Medicine | Admitting: Emergency Medicine

## 2018-12-21 DIAGNOSIS — R140 Abdominal distension (gaseous): Secondary | ICD-10-CM

## 2018-12-21 DIAGNOSIS — K219 Gastro-esophageal reflux disease without esophagitis: Secondary | ICD-10-CM | POA: Diagnosis not present

## 2018-12-21 MED ORDER — POLYETHYLENE GLYCOL 3350 17 G PO PACK: 17.0000 g | PACK | Freq: Every day | ORAL | 0 refills | Status: DC

## 2018-12-21 MED ORDER — ALUM & MAG HYDROXIDE-SIMETH 200-200-20 MG/5ML PO SUSP: 15.0000 mL | Freq: Four times a day (QID) | ORAL | 0 refills | Status: DC | PRN

## 2018-12-21 MED ORDER — DOCUSATE SODIUM 100 MG PO CAPS: 100.0000 mg | ORAL_CAPSULE | Freq: Two times a day (BID) | ORAL | 0 refills | Status: DC

## 2018-12-21 MED ORDER — LIDOCAINE VISCOUS HCL 2 % MT SOLN
15.0000 mL | Freq: Once | OROMUCOSAL | Status: AC
  Administered 2018-12-21: 18:00:00 15 mL via ORAL

## 2018-12-21 MED ORDER — PANTOPRAZOLE SODIUM 40 MG PO TBEC: 40.0000 mg | DELAYED_RELEASE_TABLET | Freq: Every day | ORAL | 0 refills | Status: DC

## 2018-12-21 MED ORDER — ALUM & MAG HYDROXIDE-SIMETH 200-200-20 MG/5ML PO SUSP
ORAL | Status: AC
  Filled 2018-12-21: qty 30

## 2018-12-21 MED ORDER — ALUM & MAG HYDROXIDE-SIMETH 200-200-20 MG/5ML PO SUSP
30.0000 mL | Freq: Once | ORAL | Status: AC
  Administered 2018-12-21: 18:00:00 30 mL via ORAL

## 2018-12-21 MED ORDER — LIDOCAINE VISCOUS HCL 2 % MT SOLN
OROMUCOSAL | Status: AC
  Filled 2018-12-21: qty 15

## 2018-12-21 NOTE — ED Triage Notes (Signed)
TRIAGED BY PROVIDER  Pt presents with symptoms of reflux

## 2018-12-21 NOTE — Discharge Instructions (Signed)
Switch to pantoprazole/protonix 40 mg daily Supplement with maalox as needed   Please use Miralax for moderate to severe constipation. Take this once a day for the next 2-3 days. Please also start docusate stool softener, twice a day for at least 1 week. If stools become loose, cut down to once a day for another week. If stools remain loose, cut back to 1 pill every other day for a third week. You can stop docusate thereafter and resume as needed for constipation.  To help reduce constipation and promote bowel health: 1. Drink at least 64 ounces of water each day 2. Eat plenty of fiber (fruits, vegetables, whole grains, legumes) 3. Be physically active or exercise including walking, jogging, swimming, yoga, etc. 4. For active constipation use a stool softener (docusate) or an osmotic laxative (like Miralax) each day, or as needed.

## 2018-12-21 NOTE — ED Provider Notes (Signed)
[facility]    CSN: [id number] Arrival date & time: 12/21/18  1725     History   Chief Complaint Chief Complaint  Patient presents with  . Gastroesophageal Reflux    HPI Jane [name] is a 63 y.o. female history of hypertension, GERD presenting today for upper abdominal discomfort and bloating. Patient notes that she has had discomfort, belching and bloating sensation after eating. Symptoms have been going on over the past couple weeks, but more prominent of recently. She has been taking esomeprazole/Nexium over the counter without relief. Does notice worsening symptoms after eating. Improved symptoms with bowel movements/ passing gas. She notes she has discomfort on bilateral sides as well. Denies chest pain and SOB. Deneis fever, chills, body aches. Denies nausea or vomiting. Denies diarrhea but does endorse constipation. Regularity of bowels off and on. Often will have daily BM, but small amount. Occasional straining. Tries prune juice occasionally. Denies URI symptoms. Denies rectal bleeding. She believes the PPI's cause headaches for her. Previously tried omeprazole/prilosec. Was seen by gastroenterology 6 months ago and recommended to try protonix as alternative. She denies taking this medicine. Denies history of DM, smoking.Mild discomfort in clinic today.  Last EGD 04/2015- small hiatal hernia, 4 mm polyp in fundus, erythema in duodenum.     HPI  Past Medical History:  Diagnosis Date  . Acid reflux   . Arthritis   . Hypertension   . Reflux   . Vasculitis [facility])     Patient Active Problem List   Diagnosis Date Noted  . Gastroesophageal reflux disease 01/29/2018  . Abdominal pain, epigastric 01/29/2018  . Plantar fasciitis, bilateral 02/29/2016  . Metatarsal deformity 02/29/2016  . Tenosynovitis of foot 02/29/2016    Past Surgical History:  Procedure Laterality Date  . COLONOSCOPY    . TUBAL LIGATION      OB History   No obstetric history on file.       Home Medications    Prior to Admission medications   Medication Sig Start Date End Date Taking? Authorizing Provider  alum & mag hydroxide-simeth (MAALOX/MYLANTA) 200-200-20 MG/5ML suspension Take 15 mLs by mouth every 6 (six) hours as needed for indigestion or heartburn. 12/21/18   Shanard Treto C, PA-C  B Complex Vitamins (VITAMIN-B COMPLEX) TABS Take 1 tablet by mouth daily.     [provider]  Cholecalciferol (D 1000) 1000 units capsule Take 1,000 Units by mouth daily.     [provider]  docusate sodium (COLACE) 100 MG capsule Take 1 capsule (100 mg total) by mouth every 12 (twelve) hours. 12/21/18   Hiilani Jetter C, PA-C  esomeprazole (NEXIUM) 20 MG capsule Take 20 mg by mouth daily at 12 noon.    [provider]  hydrochlorothiazide (HYDRODIURIL) 25 MG tablet Take 25 mg by mouth daily.    [provider]  hydrOXYzine (ATARAX/VISTARIL) 25 MG tablet Take 1 tablet (25 mg total) by mouth every 6 (six) hours. 11/29/17   Moss, Amber, DO  lisinopril-hydrochlorothiazide (PRINZIDE,ZESTORETIC) 20-12.5 MG tablet Take 1 tablet by mouth daily.    [provider]  [place] ACID PO Take by mouth.    [provider]  Multiple Vitamin (MULTIVITAMIN WITH MINERALS) TABS tablet Take 1 tablet by mouth daily.    [provider]  naproxen (EC NAPROSYN) 500 MG EC tablet naproxen 500 mg tablet,delayed release  take one tablet one a day 06/25/18   Vanessa Kick, MD  pantoprazole (PROTONIX) 40 MG tablet Take 1 tablet (40 mg  total) by mouth daily. 12/21/18   Niyah Mamaril C, PA-C  polyethylene glycol (MIRALAX / GLYCOLAX) 17 g packet Take 17 g by mouth daily. 12/21/18   Damondre Pfeifle, Elesa Hacker, PA-C    Family History Family History  Problem Relation Age of Onset  . Alcoholism Mother   . Lung disease Maternal Grandmother   . Diabetes Maternal Uncle   . Colon cancer Neg Hx   . Stomach cancer Neg Hx     Social History Social History    Tobacco Use  . Smoking status: Never Smoker  . Smokeless tobacco: Never Used  Substance Use Topics  . Alcohol use: No    Alcohol/week: 0.0 standard drinks  . Drug use: No     Allergies   Patient has no known allergies.   Review of Systems Review of Systems  Constitutional: Negative for activity change, appetite change, chills, fatigue and fever.  HENT: Positive for congestion. Negative for ear pain, rhinorrhea, sinus pressure, sore throat and trouble swallowing.   Eyes: Negative for discharge and redness.  Respiratory: Negative for cough, chest tightness and shortness of breath.   Cardiovascular: Negative for chest pain.  Gastrointestinal: Positive for abdominal pain and constipation. Negative for diarrhea, nausea and vomiting.  Musculoskeletal: Negative for myalgias.  Skin: Negative for rash.  Neurological: Negative for dizziness, light-headedness and headaches.     Physical Exam Triage Vital Signs ED Triage Vitals  Enc Vitals Group     BP 12/21/18 1810 (!) 179/107     Pulse Rate 12/21/18 1810 95     Resp 12/21/18 1810 16     Temp 12/21/18 1810 98.4 F (36.9 C)     Temp Source 12/21/18 1810 Oral     SpO2 12/21/18 1810 100 %     Weight --      Height --      Head Circumference --      Peak Flow --      Pain Score 12/21/18 1811 7     Pain Loc --      Pain Edu? --      Excl. in [place]? --    No data found.  Updated Vital Signs BP (!) 164/82 (BP Location: Left Arm)   Pulse 95   Temp 98.4 F (36.9 C) (Oral)   Resp 16   SpO2 100%   Visual Acuity Right Eye Distance:   Left Eye Distance:   Bilateral Distance:    Right Eye Near:   Left Eye Near:    Bilateral Near:     Physical Exam Vitals signs and nursing note reviewed.  Constitutional:      General: She is not in acute distress.    Appearance: She is well-developed.  HENT:     Head: Normocephalic and atraumatic.     Mouth/Throat:     Comments: Oral mucosa pink and moist, no tonsillar enlargement or  exudate. Posterior pharynx patent and nonerythematous, no uvula deviation or swelling. Normal phonation.  Eyes:     Conjunctiva/sclera: Conjunctivae normal.  Neck:     Musculoskeletal: Neck supple.  Cardiovascular:     Rate and Rhythm: Normal rate and regular rhythm.     Heart sounds: No murmur.  Pulmonary:     Effort: Pulmonary effort is normal. No respiratory distress.     Breath sounds: Normal breath sounds.     Comments: Breathing comfortably at rest, CTABL, no wheezing, rales or other adventitious sounds auscultated  Chest:     Chest wall: No tenderness.  Abdominal:     Palpations: Abdomen is soft.     Tenderness: There is abdominal tenderness.     Comments: Soft, non distended, dull to percussion. Generalized tenderness throughout abdomen, no focal tenderness, negative rebound, negative rovsings, negative mcburney's, negative murphys  Skin:    General: Skin is warm and dry.  Neurological:     Mental Status: She is alert.      UC Treatments / Results  Labs (all labs ordered are listed, but only abnormal results are displayed) Labs Reviewed - No data to display  EKG None  Radiology No results found.  Procedures Procedures (including critical care time)  Medications Ordered in UC Medications  alum & mag hydroxide-simeth (MAALOX/MYLANTA) 200-200-20 MG/5ML suspension 30 mL (30 mLs Oral Given 12/21/18 1820)    And  lidocaine (XYLOCAINE) 2 % viscous mouth solution 15 mL (15 mLs Oral Given 12/21/18 1820)    Initial Impression / Assessment and Plan / UC Course  I have reviewed the triage vital signs and the nursing notes.  Pertinent labs & imaging results that were available during my care of the patient were reviewed by me and considered in my medical decision making (see chart for details).     No fever, tachycardia, BP slightly elevated. No not suspect cardiac etiology, more likely GI given association with eating/gas. GI cocktail provided with mild improvement in  symptoms. Will switch to protonix as previously recommended by GI to see if this is more tolerable than nexium. Maalox as needed. Also discussed recommendations for constipation to regulate bowels. Continue to monitor. Discussed strict return precautions. Patient verbalized understanding and is agreeable with plan.  Final Clinical Impressions(s) / UC Diagnoses   Final diagnoses:  Gastroesophageal reflux disease, esophagitis presence not specified  Bloating     Discharge Instructions     Switch to pantoprazole/protonix 40 mg daily Supplement with maalox as needed   Please use Miralax for moderate to severe constipation. Take this once a day for the next 2-3 days. Please also start docusate stool softener, twice a day for at least 1 week. If stools become loose, cut down to once a day for another week. If stools remain loose, cut back to 1 pill every other day for a third week. You can stop docusate thereafter and resume as needed for constipation.  To help reduce constipation and promote bowel health: 1. Drink at least 64 ounces of water each day 2. Eat plenty of fiber (fruits, vegetables, whole grains, legumes) 3. Be physically active or exercise including walking, jogging, swimming, yoga, etc. 4. For active constipation use a stool softener (docusate) or an osmotic laxative (like Miralax) each day, or as needed.    ED Prescriptions    Medication Sig Dispense Auth. Provider   polyethylene glycol (MIRALAX / GLYCOLAX) 17 g packet Take 17 g by mouth daily. 14 each Kasidee Voisin C, PA-C   docusate sodium (COLACE) 100 MG capsule Take 1 capsule (100 mg total) by mouth every 12 (twelve) hours. 60 capsule Hydee Fleece C, PA-C   pantoprazole (PROTONIX) 40 MG tablet Take 1 tablet (40 mg total) by mouth daily. 30 tablet Doriann Zuch C, PA-C   alum & mag hydroxide-simeth (MAALOX/MYLANTA) 200-200-20 MG/5ML suspension Take 15 mLs by mouth every 6 (six) hours as needed for indigestion or  heartburn. 355 mL Kaliann Coryell C, PA-C     Controlled Substance Prescriptions [place] Controlled Substance Registry consulted? Not Applicable   Janith Lima, [place] 12/22/18 2135

## 2019-01-07 ENCOUNTER — Other Ambulatory Visit: Payer: Self-pay

## 2019-01-07 ENCOUNTER — Encounter: Payer: Self-pay | Admitting: Gastroenterology

## 2019-01-07 ENCOUNTER — Ambulatory Visit (INDEPENDENT_AMBULATORY_CARE_PROVIDER_SITE_OTHER): Payer: Medicare Other | Admitting: Gastroenterology

## 2019-01-07 VITALS — Ht 64.5 in | Wt 214.0 lb

## 2019-01-07 DIAGNOSIS — K219 Gastro-esophageal reflux disease without esophagitis: Secondary | ICD-10-CM

## 2019-01-07 DIAGNOSIS — Z1211 Encounter for screening for malignant neoplasm of colon: Secondary | ICD-10-CM | POA: Diagnosis not present

## 2019-01-07 DIAGNOSIS — R140 Abdominal distension (gaseous): Secondary | ICD-10-CM

## 2019-01-07 MED ORDER — NA SULFATE-K SULFATE-MG SULF 17.5-3.13-1.6 GM/177ML PO SOLN: ORAL | 0 refills | Status: DC

## 2019-01-07 NOTE — Patient Instructions (Addendum)
Continue Nexium daily  Antireflux measures  Okay to use Maalox as needed every 6 hours  Avoid NSAIDs  Lactose-free diet  Due for colorectal cancer screening.  Last colonoscopy January 2010.  Will schedule colonoscopy, next available appointment.  (01/30/2019 at 1pm) Mailed instructions today   Follow-up visit in 3 months  I appreciate the  opportunity to care for you  Thank You   Harl Bowie , MD   Gastroesophageal Reflux Disease, Adult Gastroesophageal reflux (GER) happens when acid from the stomach flows up into the tube that connects the mouth and the stomach (esophagus). Normally, food travels down the esophagus and stays in the stomach to be digested. However, when a person has GER, food and stomach acid sometimes move back up into the esophagus. If this becomes a more serious problem, the person may be diagnosed with a disease called gastroesophageal reflux disease (GERD). GERD occurs when the reflux:  Happens often.  Causes frequent or severe symptoms.  Causes problems such as damage to the esophagus. When stomach acid comes in contact with the esophagus, the acid may cause soreness (inflammation) in the esophagus. Over time, GERD may create small holes (ulcers) in the lining of the esophagus. What are the causes? This condition is caused by a problem with the muscle between the esophagus and the stomach (lower esophageal sphincter, or LES). Normally, the LES muscle closes after food passes through the esophagus to the stomach. When the LES is weakened or abnormal, it does not close properly, and that allows food and stomach acid to go back up into the esophagus. The LES can be weakened by certain dietary substances, medicines, and medical conditions, including:  Tobacco use.  Pregnancy.  Having a hiatal hernia.  Alcohol use.  Certain foods and beverages, such as coffee, chocolate, onions, and peppermint. What increases the risk? You are more likely to develop  this condition if you:  Have an increased body weight.  Have a connective tissue disorder.  Use NSAID medicines. What are the signs or symptoms? Symptoms of this condition include:  Heartburn.  Difficult or painful swallowing.  The feeling of having a lump in the throat.  Abitter taste in the mouth.  Bad breath.  Having a large amount of saliva.  Having an upset or bloated stomach.  Belching.  Chest pain. Different conditions can cause chest pain. Make sure you see your health care provider if you experience chest pain.  Shortness of breath or wheezing.  Ongoing (chronic) cough or a night-time cough.  Wearing away of tooth enamel.  Weight loss. How is this diagnosed? Your health care provider will take a medical history and perform a physical exam. To determine if you have mild or severe GERD, your health care provider may also monitor how you respond to treatment. You may also have tests, including:  A test to examine your stomach and esophagus with a small camera (endoscopy).  A test thatmeasures the acidity level in your esophagus.  A test thatmeasures how much pressure is on your esophagus.  A barium swallow or modified barium swallow test to show the shape, size, and functioning of your esophagus. How is this treated? The goal of treatment is to help relieve your symptoms and to prevent complications. Treatment for this condition may vary depending on how severe your symptoms are. Your health care provider may recommend:  Changes to your diet.  Medicine.  Surgery. Follow these instructions at home: Eating and drinking   Follow a diet as recommended  by your health care provider. This may involve avoiding foods and drinks such as: ? Coffee and tea (with or without caffeine). ? Drinks that containalcohol. ? Energy drinks and sports drinks. ? Carbonated drinks or sodas. ? Chocolate and cocoa. ? Peppermint and mint flavorings. ? Garlic and  onions. ? Horseradish. ? Spicy and acidic foods, including peppers, chili powder, curry powder, vinegar, hot sauces, and barbecue sauce. ? Citrus fruit juices and citrus fruits, such as oranges, lemons, and limes. ? Tomato-based foods, such as red sauce, chili, salsa, and pizza with red sauce. ? Fried and fatty foods, such as donuts, french fries, potato chips, and high-fat dressings. ? High-fat meats, such as hot dogs and fatty cuts of red and white meats, such as rib eye steak, sausage, ham, and bacon. ? High-fat dairy items, such as whole milk, butter, and cream cheese.  Eat small, frequent meals instead of large meals.  Avoid drinking large amounts of liquid with your meals.  Avoid eating meals during the 2-3 hours before bedtime.  Avoid lying down right after you eat.  Do not exercise right after you eat. Lifestyle   Do not use any products that contain nicotine or tobacco, such as cigarettes, e-cigarettes, and chewing tobacco. If you need help quitting, ask your health care provider.  Try to reduce your stress by using methods such as yoga or meditation. If you need help reducing stress, ask your health care provider.  If you are overweight, reduce your weight to an amount that is healthy for you. Ask your health care provider for guidance about a safe weight loss goal. General instructions  Pay attention to any changes in your symptoms.  Take over-the-counter and prescription medicines only as told by your health care provider. Do not take aspirin, ibuprofen, or other NSAIDs unless your health care provider told you to do so.  Wear loose-fitting clothing. Do not wear anything tight around your waist that causes pressure on your abdomen.  Raise (elevate) the head of your bed about 6 inches (15 cm).  Avoid bending over if this makes your symptoms worse.  Keep all follow-up visits as told by your health care provider. This is important. Contact a health care provider  if:  You have: ? New symptoms. ? Unexplained weight loss. ? Difficulty swallowing or it hurts to swallow. ? Wheezing or a persistent cough. ? A hoarse voice.  Your symptoms do not improve with treatment. Get help right away if you:  Have pain in your arms, neck, jaw, teeth, or back.  Feel sweaty, dizzy, or light-headed.  Have chest pain or shortness of breath.  Vomit and your vomit looks like blood or coffee grounds.  Faint.  Have stool that is bloody or black.  Cannot swallow, drink, or eat. Summary  Gastroesophageal reflux happens when acid from the stomach flows up into the esophagus. GERD is a disease in which the reflux happens often, causes frequent or severe symptoms, or causes problems such as damage to the esophagus.  Treatment for this condition may vary depending on how severe your symptoms are. Your health care provider may recommend diet and lifestyle changes, medicine, or surgery.  Contact a health care provider if you have new or worsening symptoms.  Take over-the-counter and prescription medicines only as told by your health care provider. Do not take aspirin, ibuprofen, or other NSAIDs unless your health care provider told you to do so.  Keep all follow-up visits as told by your health care provider.  This is important. This information is not intended to replace advice given to you by your health care provider. Make sure you discuss any questions you have with your health care provider. Document Released: 05/16/2005 Document Revised: 02/12/2018 Document Reviewed: 02/12/2018 Elsevier Interactive Patient Education  2019 [employer]    Lactose-Free Diet, Adult If you have lactose intolerance, you are not able to digest lactose. Lactose is a natural sugar found mainly in dairy milk and dairy products. You may need to avoid all foods and beverages that contain lactose. A lactose-free diet can help you do this. Which foods have lactose? Lactose is found in  dairy milk and dairy products, such as:  Yogurt.  Cheese.  Butter.  Margarine.  Sour cream.  Cream.  Whipped toppings and nondairy creamers.  Ice cream and other dairy-based desserts. Lactose is also found in foods or products made with dairy milk or milk ingredients. To find out whether a food contains dairy milk or a milk ingredient, look at the ingredients list. Avoid foods with the statement "May contain milk" and foods that contain:  Milk powder.  Whey.  Curd.  Caseinate.  Lactose.  Lactalbumin.  Lactoglobulin. What are alternatives to dairy milk and foods made with milk products?  Lactose-free milk.  Soy milk with added calcium and vitamin D.  Almond milk, coconut milk, rice milk, or other nondairy milk alternatives with added calcium and vitamin D. Note that these are low in protein.  Soy products, such as soy yogurt, soy cheese, soy ice cream, and soy-based sour cream.  Other nut milk products, such as almond yogurt, almond cheese, cashew yogurt, cashew cheese, cashew ice cream, coconut yogurt, and coconut ice cream. What are tips for following this plan?  Do not consume foods, beverages, vitamins, minerals, or medicines containing lactose. Read ingredient lists carefully.  Look for the words "lactose-free" on labels.  Use lactase enzyme drops or tablets as directed by your health care provider.  Use lactose-free milk or a milk alternative, such as soy milk or almond milk, for drinking and cooking.  Make sure you get enough calcium and vitamin D in your diet. A lactose-free eating plan can be lacking in these important nutrients.  Take calcium and vitamin D supplements as directed by your health care provider. Talk to your health care provider about supplements if you are not able to get enough calcium and vitamin D from food. What foods can I eat?  Fruits All fresh, canned, frozen, or dried fruits that are not processed with  lactose. Vegetables All fresh, frozen, and canned vegetables without cheese, cream, or butter sauces. Grains Any that are not made with dairy milk or dairy products. Meats and other proteins Any meat, fish, poultry, and other protein sources that are not made with dairy milk or dairy products. Soy cheese and yogurt. Fats and oils Any that are not made with dairy milk or dairy products. Beverages Lactose-free milk. Soy, rice, or almond milk with added calcium and vitamin D. Fruit and vegetable juices. Sweets and desserts Any that are not made with dairy milk or dairy products. Seasonings and condiments Any that are not made with dairy milk or dairy products. Calcium Calcium is found in many foods that contain lactose and is important for bone health. The amount of calcium you need depends on your age:  Adults younger than 50 years: 1,000 mg of calcium a day.  Adults older than 50 years: 1,200 mg of calcium a day. If you are  not getting enough calcium, you may get it from other sources, including:  Orange juice with calcium added. There are 300-350 mg of calcium in 1 cup of orange juice.  Calcium-fortified soy milk. There are 300-400 mg of calcium in 1 cup of calcium-fortified soy milk.  Calcium-fortified rice or almond milk. There are 300 mg of calcium in 1 cup of calcium-fortified rice or almond milk.  Calcium-fortified breakfast cereals. There are 100-1,000 mg of calcium in calcium-fortified breakfast cereals.  Spinach, cooked. There are 145 mg of calcium in  cup of cooked spinach.  Edamame, cooked. There are 130 mg of calcium in  cup of cooked edamame.  Collard greens, cooked. There are 125 mg of calcium in  cup of cooked collard greens.  Kale, frozen or cooked. There are 90 mg of calcium in  cup of cooked or frozen kale.  Almonds. There are 95 mg of calcium in  cup of almonds.  Broccoli, cooked. There are 60 mg of calcium in 1 cup of cooked broccoli. The items listed  above may not be a complete list of recommended foods and beverages. Contact a dietitian for more options. What foods are not recommended? Fruits None, unless they are made with dairy milk or dairy products. Vegetables None, unless they are made with dairy milk or dairy products. Grains Any grains that are made with dairy milk or dairy products. Meats and other proteins None, unless they are made with dairy milk or dairy products. Dairy All dairy products, including milk, goat's milk, buttermilk, kefir, acidophilus milk, flavored milk, evaporated milk, condensed milk, dulce de [place], eggnog, yogurt, cheese, and cheese spreads. Fats and oils Any that are made with milk or milk products. Margarines and salad dressings that contain milk or cheese. Cream. Half and half. Cream cheese. Sour cream. Chip dips made with sour cream or yogurt. Beverages Hot chocolate. Cocoa with lactose. Instant iced teas. Powdered fruit drinks. Smoothies made with dairy milk or yogurt. Sweets and desserts Any that are made with milk or milk products. Seasonings and condiments Chewing gum that has lactose. Spice blends if they contain lactose. Artificial sweeteners that contain lactose. Nondairy creamers. The items listed above may not be a complete list of foods and beverages to avoid. Contact a dietitian for more information. Summary  If you are lactose intolerant, it means that you have a hard time digesting lactose, a natural sugar found in milk and milk products.  Following a lactose-free diet can help you manage this condition.  Calcium is important for bone health and is found in many foods that contain lactose. Talk with your health care provider about other sources of calcium. This information is not intended to replace advice given to you by your health care provider. Make sure you discuss any questions you have with your health care provider. Document Released: 01/26/2002 Document Revised: 09/03/2017  Document Reviewed: 09/03/2017 Elsevier Interactive Patient Education  2019 [employer].

## 2019-01-07 NOTE — Progress Notes (Signed)
Jane [name]    [id number]    03-03-1956  Primary Care Physician:Reives, Derl Barrow, NP  Referring Physician: Jasmine December, NP [place], [place] [address]  This service was provided via audio and video telemedicine (Doximity) due to [place] 19 pandemic.  Patient location: Home Provider location: Office Used 2 patient identifiers to confirm the correct person. Explained the limitations in evaluation and management via telemedicine. Patient is aware of potential medical charges for this visit.  Patient consented to this virtual visit.  The persons participating in this telemedicine service were myself and the patient   Chief complaint: GERD  HPI:  63 year old female with history of chronic GERD, last seen in office by Janett Billow in June 2019.  Patient was recently in ER few weeks ago with worsening GERD symptoms and epigastric discomfort. She was taking Nexium over-the-counter with no improvement of her symptoms.  Denies vomiting, nausea, melena or blood per rectum. She was switched to Protonix, was given Maalox with improvement of her symptoms and discharged home.  She was also started on MiraLAX for constipation. She is feeling somewhat better.  Her symptoms are worse with spicy foods.  Denies dysphagia or odynophagia.  Continues to have intermittent bloating.   EGD at [facility] by Dr. Darrel Hoover in 04/2015 at which time she was found to have a small hiatal hernia, a suspected small fundic gland polyp, and some erythema in the duodenum.  Gastric biopsies actually just showed oxyntic mucosa with PPI effect and foveolar hyperplasia.  Duodenal biopsies were normal.  Biopsies results are in [place].  she had a colonoscopy in 2010 by Dr. Collene Mares that she says was normal and repeat was recommended in 10 years from that time.    Outpatient Encounter Medications as of 01/07/2019  Medication Sig  . alum & mag hydroxide-simeth (MAALOX/MYLANTA) 200-200-20 MG/5ML suspension Take  15 mLs by mouth every 6 (six) hours as needed for indigestion or heartburn.  . B Complex Vitamins (VITAMIN-B COMPLEX) TABS Take 1 tablet by mouth daily.   . Cholecalciferol (D 1000) 1000 units capsule Take 1,000 Units by mouth daily.   Marland Kitchen docusate sodium (COLACE) 100 MG capsule Take 1 capsule (100 mg total) by mouth every 12 (twelve) hours.  Marland Kitchen esomeprazole (NEXIUM) 20 MG capsule Take 20 mg by mouth daily at 12 noon.  . hydrochlorothiazide (HYDRODIURIL) 25 MG tablet Take 25 mg by mouth daily.  . hydrOXYzine (ATARAX/VISTARIL) 25 MG tablet Take 1 tablet (25 mg total) by mouth every 6 (six) hours.  Marland Kitchen lisinopril-hydrochlorothiazide (PRINZIDE,ZESTORETIC) 20-12.5 MG tablet Take 1 tablet by mouth daily.  Marland Kitchen MAGNESIUM-CALCIUM-FOLIC ACID PO Take by mouth.  . Multiple Vitamin (MULTIVITAMIN WITH MINERALS) TABS tablet Take 1 tablet by mouth daily.  . naproxen (EC NAPROSYN) 500 MG EC tablet naproxen 500 mg tablet,delayed release  take one tablet one a day  . pantoprazole (PROTONIX) 40 MG tablet Take 1 tablet (40 mg total) by mouth daily.  . polyethylene glycol (MIRALAX / GLYCOLAX) 17 g packet Take 17 g by mouth daily.   No facility-administered encounter medications on file as of 01/07/2019.     Allergies as of 01/07/2019  . (No Known Allergies)    Past Medical History:  Diagnosis Date  . Acid reflux   . Arthritis   . Hypertension   . Reflux   . Vasculitis [facility])     Past Surgical History:  Procedure Laterality Date  . COLONOSCOPY    .  TUBAL LIGATION      Family History  Problem Relation Age of Onset  . Alcoholism Mother   . Lung disease Maternal Grandmother   . Diabetes Maternal Uncle   . Colon cancer Neg Hx   . Stomach cancer Neg Hx     Social History   Socioeconomic History  . Marital status: Divorced    Spouse name: Not on file  . Number of children: Not on file  . Years of education: Not on file  . Highest education level: Not on file  Occupational History  . Occupation:  Chartered certified accountant  Social Needs  . Financial resource strain: Not on file  . Food insecurity:    Worry: Not on file    Inability: Not on file  . Transportation needs:    Medical: Not on file    Non-medical: Not on file  Tobacco Use  . Smoking status: Never Smoker  . Smokeless tobacco: Never Used  Substance and Sexual Activity  . Alcohol use: No    Alcohol/week: 0.0 standard drinks  . Drug use: No  . Sexual activity: Not on file  Lifestyle  . Physical activity:    Days per week: Not on file    Minutes per session: Not on file  . Stress: Not on file  Relationships  . Social connections:    Talks on phone: Not on file    Gets together: Not on file    Attends religious service: Not on file    Active member of club or organization: Not on file    Attends meetings of clubs or organizations: Not on file    Relationship status: Not on file  . Intimate partner violence:    Fear of current or ex partner: Not on file    Emotionally abused: Not on file    Physically abused: Not on file    Forced sexual activity: Not on file  Other Topics Concern  . Not on file  Social History Narrative  . Not on file      Review of systems: Review of Systems as per HPI All other systems reviewed and are negative.   Physical Exam: Vitals were not taken and physical exam was not performed during this virtual visit.  Data Reviewed:  Reviewed labs, radiology imaging, old records and pertinent past GI work up   Assessment and Plan/Recommendations:  63 year old female with chronic GERD, epigastric discomfort Symptoms improved with Nexium daily and Maalox as needed Continue current medications Discussed antireflux measures Avoid NSAIDs  Intermittent bloating could be secondary to lactose intolerance Trial of lactose-free diet  Due for colorectal cancer screening, will schedule colonoscopy The risks and benefits as well as alternatives of endoscopic procedure(s) have been discussed and  reviewed. All questions answered. The patient agrees to proceed.   Follow-up in 3 months  K. Denzil Magnuson , MD   CC: Jasmine December, NP

## 2019-01-18 ENCOUNTER — Encounter: Payer: Self-pay | Admitting: Gastroenterology

## 2019-01-30 ENCOUNTER — Encounter: Payer: TRICARE For Life (TFL) | Admitting: Gastroenterology

## 2019-02-12 ENCOUNTER — Telehealth: Payer: Self-pay | Admitting: Gastroenterology

## 2019-02-12 NOTE — Telephone Encounter (Signed)
Spoke w/patient regarding Covid-19 screening questions °Covid-19 Screening Questions: ° °Do you now or have you had a fever in the last 14 days? no ° °Do you have any respiratory symptoms of shortness of breath or cough now or in the last 14 days? no ° °Do you have any family members or close contacts with diagnosed or suspected Covid-19 in the past 14 days? no ° °Have you been tested for Covid-19 and found to be positive? no ° °Pt made aware of that care partner may wait in the car or come up to the lobby during the procedure but will need to provide their own mask. °

## 2019-02-13 ENCOUNTER — Other Ambulatory Visit: Payer: Self-pay

## 2019-02-13 ENCOUNTER — Ambulatory Visit (AMBULATORY_SURGERY_CENTER): Payer: Medicare Other | Admitting: Gastroenterology

## 2019-02-13 ENCOUNTER — Encounter: Payer: Self-pay | Admitting: Gastroenterology

## 2019-02-13 VITALS — BP 171/86 | HR 79 | Temp 98.9°F | Resp 16 | Ht 64.0 in | Wt 214.0 lb

## 2019-02-13 DIAGNOSIS — D123 Benign neoplasm of transverse colon: Secondary | ICD-10-CM

## 2019-02-13 DIAGNOSIS — Z1211 Encounter for screening for malignant neoplasm of colon: Secondary | ICD-10-CM

## 2019-02-13 DIAGNOSIS — K219 Gastro-esophageal reflux disease without esophagitis: Secondary | ICD-10-CM

## 2019-02-13 DIAGNOSIS — K635 Polyp of colon: Secondary | ICD-10-CM | POA: Diagnosis not present

## 2019-02-13 MED ORDER — SODIUM CHLORIDE 0.9 % IV SOLN: 500.0000 mL | Freq: Once | INTRAVENOUS | Status: DC

## 2019-02-13 NOTE — Progress Notes (Signed)
Pt's states no medical or surgical changes since previsit or office visit. 

## 2019-02-13 NOTE — Progress Notes (Signed)
Called to room to assist during endoscopic procedure.  Patient ID and intended procedure confirmed with present staff. Received instructions for my participation in the procedure from the performing physician.  

## 2019-02-13 NOTE — Patient Instructions (Signed)
YOU HAD AN ENDOSCOPIC PROCEDURE TODAY AT THE [facility]:   Refer to the procedure report that was given to you for any specific questions about what was found during the examination.  If the procedure report does not answer your questions, please call your gastroenterologist to clarify.  If you requested that your care partner not be given the details of your procedure findings, then the procedure report has been included in a sealed envelope for you to review at your convenience later.  YOU SHOULD EXPECT: Some feelings of bloating in the abdomen. Passage of more gas than usual.  Walking can help get rid of the air that was put into your GI tract during the procedure and reduce the bloating. If you had a lower endoscopy (such as a colonoscopy or flexible sigmoidoscopy) you may notice spotting of blood in your stool or on the toilet paper. If you underwent a bowel prep for your procedure, you may not have a normal bowel movement for a few days.  Please Note:  You might notice some irritation and congestion in your nose or some drainage.  This is from the oxygen used during your procedure.  There is no need for concern and it should clear up in a day or so.  SYMPTOMS TO REPORT IMMEDIATELY:   Following lower endoscopy (colonoscopy or flexible sigmoidoscopy):  Excessive amounts of blood in the stool  Significant tenderness or worsening of abdominal pains  Swelling of the abdomen that is new, acute  Fever of 100F or higher   For urgent or emergent issues, a gastroenterologist can be reached at any hour by calling [phone].   DIET:  We do recommend a small meal at first, but then you may proceed to your regular diet.  Drink plenty of fluids but you should avoid alcoholic beverages for 24 hours.  MEDICATIONS: Continue present medications.  Please see handouts given to you by your recovery nurse.  ACTIVITY:  You should plan to take it easy for the rest of today and you should  NOT DRIVE or use heavy machinery until tomorrow (because of the sedation medicines used during the test).    FOLLOW UP: Our staff will call the number listed on your records 48-72 hours following your procedure to check on you and address any questions or concerns that you may have regarding the information given to you following your procedure. If we do not reach you, we will leave a message.  We will attempt to reach you two times.  During this call, we will ask if you have developed any symptoms of COVID 19. If you develop any symptoms (ie: fever, flu-like symptoms, shortness of breath, cough etc.) before then, please call [phone].  If you test positive for Covid 19 in the 2 weeks post procedure, please call and report this information to us.    If any biopsies were taken you will be contacted by phone or by letter within the next 1-3 weeks.  Please call us at [phone] if you have not heard about the biopsies in 3 weeks.   Thank you for allowing us to provide for your healthcare needs today.   SIGNATURES/CONFIDENTIALITY: You and/or your care partner have signed paperwork which will be entered into your electronic medical record.  These signatures attest to the fact that that the information above on your After Visit Summary has been reviewed and is understood.  Full responsibility of the confidentiality of this discharge information lies with you and/or   your care-partner. 

## 2019-02-13 NOTE — Op Note (Signed)
[facility] Patient Name: Jane [name] Procedure Date: 02/13/2019 1:02 PM MRN: [id number] Endoscopist: Mauri Pole , MD Age: 63 Referring MD:  Date of Birth: 11-Jul-1956 Gender: Female Account #: [id number] Procedure:                Colonoscopy Indications:              Screening for colorectal malignant neoplasm Medicines:                Monitored Anesthesia Care Procedure:                Pre-Anesthesia Assessment:                           - Prior to the procedure, a History and Physical                            was performed, and patient medications and                            allergies were reviewed. The patient's tolerance of                            previous anesthesia was also reviewed. The risks                            and benefits of the procedure and the sedation                            options and risks were discussed with the patient.                            All questions were answered, and informed consent                            was obtained. Prior Anticoagulants: The patient has                            taken no previous anticoagulant or antiplatelet                            agents. ASA Grade Assessment: III - A patient with                            severe systemic disease. After reviewing the risks                            and benefits, the patient was deemed in                            satisfactory condition to undergo the procedure.                           After obtaining informed consent, the colonoscope  was passed under direct vision. Throughout the                            procedure, the patient's blood pressure, pulse, and                            oxygen saturations were monitored continuously. The                            Colonoscope was introduced through the anus and                            advanced to the the cecum, identified by                            appendiceal orifice  and ileocecal valve. The                            colonoscopy was performed without difficulty. The                            patient tolerated the procedure well. The quality                            of the bowel preparation was excellent. The                            ileocecal valve, appendiceal orifice, and rectum                            were photographed. Scope In: 1:07:16 PM Scope Out: 1:21:49 PM Scope Withdrawal Time: 0 hours 8 minutes 52 seconds  Total Procedure Duration: 0 hours 14 minutes 33 seconds  Findings:                 The perianal and digital rectal examinations were                            normal.                           Two sessile polyps were found in the transverse                            colon. The polyps were 4 to 6 mm in size. These                            polyps were removed with a cold snare. Resection                            and retrieval were complete.                           Scattered small and large-mouthed diverticula were  found in the sigmoid colon and descending colon.                           Non-bleeding internal hemorrhoids were found during                            retroflexion. The hemorrhoids were small. Complications:            No immediate complications. Estimated Blood Loss:     Estimated blood loss was minimal. Impression:               - Two 4 to 6 mm polyps in the transverse colon,                            removed with a cold snare. Resected and retrieved.                           - Moderate diverticulosis in the sigmoid colon and                            in the descending colon.                           - Non-bleeding internal hemorrhoids. Recommendation:           - Patient has a contact number available for                            emergencies. The signs and symptoms of potential                            delayed complications were discussed with the                             patient. Return to normal activities tomorrow.                            Written discharge instructions were provided to the                            patient.                           - Resume previous diet.                           - Continue present medications.                           - Await pathology results.                           - Repeat colonoscopy in 5-10 years for surveillance                            based on pathology results.                           -  Return to GI clinic PRN. Mauri Pole, MD 02/13/2019 1:29:31 PM This report has been signed electronically.

## 2019-02-13 NOTE — Progress Notes (Signed)
Pt drowsy. VSS report given to RN. No anesthetic complications noted

## 2019-02-17 ENCOUNTER — Telehealth: Payer: Self-pay

## 2019-02-17 ENCOUNTER — Telehealth: Payer: Self-pay | Admitting: *Deleted

## 2019-02-17 NOTE — Telephone Encounter (Signed)
Patient call back and is doing well.

## 2019-02-17 NOTE — Telephone Encounter (Signed)
  Follow up Call-  Call back number 02/13/2019  Post procedure Call Back phone  # [phone]  Permission to leave phone message Yes  Some recent data might be hidden     No ID on voicemail No message left Will try again midday

## 2019-02-17 NOTE — Telephone Encounter (Signed)
  Follow up Call-  Call back number 02/13/2019  Post procedure Call Back phone  # [phone]  Permission to leave phone message Yes  Some recent data might be hidden   LMOM to call back with any questions or concerns or yes to any of the following-  1. Have you developed a fever since your procedure?   2.   Have you had an respiratory symptoms (SOB or cough) since your procedure?   3.   Have you tested positive for COVID 19 since your procedure  4.   Have you had any family members/close contacts diagnosed with the COVID 19 since your procedure?    If yes to any of these questions please route to Joylene John, RN and Alphonsa Gin, RN.

## 2019-02-26 DIAGNOSIS — M67371 Transient synovitis, right ankle and foot: Secondary | ICD-10-CM | POA: Diagnosis not present

## 2019-02-26 DIAGNOSIS — M19072 Primary osteoarthritis, left ankle and foot: Secondary | ICD-10-CM | POA: Diagnosis not present

## 2019-02-26 DIAGNOSIS — M76822 Posterior tibial tendinitis, left leg: Secondary | ICD-10-CM | POA: Diagnosis not present

## 2019-02-26 DIAGNOSIS — M19071 Primary osteoarthritis, right ankle and foot: Secondary | ICD-10-CM | POA: Diagnosis not present

## 2019-02-27 ENCOUNTER — Encounter: Payer: Self-pay | Admitting: Gastroenterology

## 2019-03-05 DIAGNOSIS — M71572 Other bursitis, not elsewhere classified, left ankle and foot: Secondary | ICD-10-CM | POA: Diagnosis not present

## 2019-03-05 DIAGNOSIS — M76822 Posterior tibial tendinitis, left leg: Secondary | ICD-10-CM | POA: Diagnosis not present

## 2019-03-05 DIAGNOSIS — M7751 Other enthesopathy of right foot: Secondary | ICD-10-CM | POA: Diagnosis not present

## 2019-03-05 DIAGNOSIS — M67371 Transient synovitis, right ankle and foot: Secondary | ICD-10-CM | POA: Diagnosis not present

## 2019-03-16 DIAGNOSIS — M76821 Posterior tibial tendinitis, right leg: Secondary | ICD-10-CM | POA: Diagnosis not present

## 2019-03-16 DIAGNOSIS — M67371 Transient synovitis, right ankle and foot: Secondary | ICD-10-CM | POA: Diagnosis not present

## 2019-03-24 DIAGNOSIS — M76821 Posterior tibial tendinitis, right leg: Secondary | ICD-10-CM | POA: Diagnosis not present

## 2019-03-24 DIAGNOSIS — M67371 Transient synovitis, right ankle and foot: Secondary | ICD-10-CM | POA: Diagnosis not present

## 2019-03-24 DIAGNOSIS — M71572 Other bursitis, not elsewhere classified, left ankle and foot: Secondary | ICD-10-CM | POA: Diagnosis not present

## 2019-03-24 DIAGNOSIS — M7751 Other enthesopathy of right foot: Secondary | ICD-10-CM | POA: Diagnosis not present

## 2019-04-06 DIAGNOSIS — M76822 Posterior tibial tendinitis, left leg: Secondary | ICD-10-CM | POA: Diagnosis not present

## 2019-04-06 DIAGNOSIS — M67371 Transient synovitis, right ankle and foot: Secondary | ICD-10-CM | POA: Diagnosis not present

## 2019-04-15 ENCOUNTER — Other Ambulatory Visit: Payer: Self-pay | Admitting: *Deleted

## 2019-04-15 DIAGNOSIS — Z23 Encounter for immunization: Secondary | ICD-10-CM | POA: Diagnosis not present

## 2019-04-15 DIAGNOSIS — I10 Essential (primary) hypertension: Secondary | ICD-10-CM | POA: Diagnosis not present

## 2019-04-15 DIAGNOSIS — Z1389 Encounter for screening for other disorder: Secondary | ICD-10-CM | POA: Diagnosis not present

## 2019-04-15 DIAGNOSIS — E7800 Pure hypercholesterolemia, unspecified: Secondary | ICD-10-CM | POA: Diagnosis not present

## 2019-04-15 DIAGNOSIS — Z6836 Body mass index (BMI) 36.0-36.9, adult: Secondary | ICD-10-CM | POA: Diagnosis not present

## 2019-04-15 DIAGNOSIS — M810 Age-related osteoporosis without current pathological fracture: Secondary | ICD-10-CM

## 2019-04-15 DIAGNOSIS — Z136 Encounter for screening for cardiovascular disorders: Secondary | ICD-10-CM | POA: Diagnosis not present

## 2019-04-15 DIAGNOSIS — Z1339 Encounter for screening examination for other mental health and behavioral disorders: Secondary | ICD-10-CM | POA: Diagnosis not present

## 2019-04-15 DIAGNOSIS — F419 Anxiety disorder, unspecified: Secondary | ICD-10-CM | POA: Diagnosis not present

## 2019-04-15 DIAGNOSIS — Z72820 Sleep deprivation: Secondary | ICD-10-CM | POA: Diagnosis not present

## 2019-04-15 DIAGNOSIS — Z1331 Encounter for screening for depression: Secondary | ICD-10-CM | POA: Diagnosis not present

## 2019-04-15 DIAGNOSIS — E669 Obesity, unspecified: Secondary | ICD-10-CM | POA: Diagnosis not present

## 2019-05-04 DIAGNOSIS — M7751 Other enthesopathy of right foot: Secondary | ICD-10-CM | POA: Diagnosis not present

## 2019-05-04 DIAGNOSIS — M71572 Other bursitis, not elsewhere classified, left ankle and foot: Secondary | ICD-10-CM | POA: Diagnosis not present

## 2019-05-04 DIAGNOSIS — M76821 Posterior tibial tendinitis, right leg: Secondary | ICD-10-CM | POA: Diagnosis not present

## 2019-05-05 DIAGNOSIS — B351 Tinea unguium: Secondary | ICD-10-CM | POA: Diagnosis not present

## 2019-06-03 DIAGNOSIS — B351 Tinea unguium: Secondary | ICD-10-CM | POA: Diagnosis not present

## 2019-06-22 ENCOUNTER — Other Ambulatory Visit: Payer: Self-pay | Admitting: *Deleted

## 2019-06-22 DIAGNOSIS — Z13820 Encounter for screening for osteoporosis: Secondary | ICD-10-CM

## 2019-06-24 ENCOUNTER — Other Ambulatory Visit: Payer: TRICARE For Life (TFL)

## 2019-07-31 DIAGNOSIS — B351 Tinea unguium: Secondary | ICD-10-CM | POA: Diagnosis not present

## 2019-09-15 DIAGNOSIS — Z23 Encounter for immunization: Secondary | ICD-10-CM | POA: Diagnosis not present

## 2019-09-17 ENCOUNTER — Encounter: Payer: Self-pay | Admitting: Gastroenterology

## 2019-09-17 ENCOUNTER — Other Ambulatory Visit: Payer: Self-pay

## 2019-09-17 ENCOUNTER — Ambulatory Visit (INDEPENDENT_AMBULATORY_CARE_PROVIDER_SITE_OTHER): Payer: Medicare Other | Admitting: Gastroenterology

## 2019-09-17 VITALS — BP 132/80 | HR 68 | Temp 97.8°F | Ht 64.0 in | Wt 222.0 lb

## 2019-09-17 DIAGNOSIS — E739 Lactose intolerance, unspecified: Secondary | ICD-10-CM

## 2019-09-17 DIAGNOSIS — K449 Diaphragmatic hernia without obstruction or gangrene: Secondary | ICD-10-CM | POA: Diagnosis not present

## 2019-09-17 DIAGNOSIS — R140 Abdominal distension (gaseous): Secondary | ICD-10-CM

## 2019-09-17 DIAGNOSIS — R1013 Epigastric pain: Secondary | ICD-10-CM | POA: Diagnosis not present

## 2019-09-17 DIAGNOSIS — Z86010 Personal history of colonic polyps: Secondary | ICD-10-CM

## 2019-09-17 DIAGNOSIS — R1310 Dysphagia, unspecified: Secondary | ICD-10-CM | POA: Diagnosis not present

## 2019-09-17 MED ORDER — ESOMEPRAZOLE MAGNESIUM 40 MG PO CPDR: 40.0000 mg | DELAYED_RELEASE_CAPSULE | Freq: Every day | ORAL | 11 refills | Status: DC

## 2019-09-17 NOTE — Patient Instructions (Signed)
If you are age 66 or older, your body mass index should be between 23-30. Your Body mass index is 38.11 kg/m. If this is out of the aformentioned range listed listed, please consider follow up with your Primary Care Provider.  If you are age 66 or younger, your body mass index should be between 19-25. Your Body mass index is 38.11 kg/m. If this is out of the aformentioned range listed, please consider follow up with your Primary Care Provider.    You have been scheduled for an endoscopy. Please follow written instructions given to you at your visit today. If you use inhalers (even only as needed), please bring them with you on the day of your procedure.   We have sent Nexium to your pharmacy   Gastroesophageal Reflux Disease, Adult Gastroesophageal reflux (GER) happens when acid from the stomach flows up into the tube that connects the mouth and the stomach (esophagus). Normally, food travels down the esophagus and stays in the stomach to be digested. However, when a person has GER, food and stomach acid sometimes move back up into the esophagus. If this becomes a more serious problem, the person may be diagnosed with a disease called gastroesophageal reflux disease (GERD). GERD occurs when the reflux:  Happens often.  Causes frequent or severe symptoms.  Causes problems such as damage to the esophagus. When stomach acid comes in contact with the esophagus, the acid may cause soreness (inflammation) in the esophagus. Over time, GERD may create small holes (ulcers) in the lining of the esophagus. What are the causes? This condition is caused by a problem with the muscle between the esophagus and the stomach (lower esophageal sphincter, or LES). Normally, the LES muscle closes after food passes through the esophagus to the stomach. When the LES is weakened or abnormal, it does not close properly, and that allows food and stomach acid to go back up into the esophagus. The LES can be weakened by  certain dietary substances, medicines, and medical conditions, including:  Tobacco use.  Pregnancy.  Having a hiatal hernia.  Alcohol use.  Certain foods and beverages, such as coffee, chocolate, onions, and peppermint. What increases the risk? You are more likely to develop this condition if you:  Have an increased body weight.  Have a connective tissue disorder.  Use NSAID medicines. What are the signs or symptoms? Symptoms of this condition include:  Heartburn.  Difficult or painful swallowing.  The feeling of having a lump in the throat.  Abitter taste in the mouth.  Bad breath.  Having a large amount of saliva.  Having an upset or bloated stomach.  Belching.  Chest pain. Different conditions can cause chest pain. Make sure you see your health care provider if you experience chest pain.  Shortness of breath or wheezing.  Ongoing (chronic) cough or a night-time cough.  Wearing away of tooth enamel.  Weight loss. How is this diagnosed? Your health care provider will take a medical history and perform a physical exam. To determine if you have mild or severe GERD, your health care provider may also monitor how you respond to treatment. You may also have tests, including:  A test to examine your stomach and esophagus with a small camera (endoscopy).  A test thatmeasures the acidity level in your esophagus.  A test thatmeasures how much pressure is on your esophagus.  A barium swallow or modified barium swallow test to show the shape, size, and functioning of your esophagus. How is this treated?  The goal of treatment is to help relieve your symptoms and to prevent complications. Treatment for this condition may vary depending on how severe your symptoms are. Your health care provider may recommend:  Changes to your diet.  Medicine.  Surgery. Follow these instructions at home: Eating and drinking   Follow a diet as recommended by your health care  provider. This may involve avoiding foods and drinks such as: ? Coffee and tea (with or without caffeine). ? Drinks that containalcohol. ? Energy drinks and sports drinks. ? Carbonated drinks or sodas. ? Chocolate and cocoa. ? Peppermint and mint flavorings. ? Garlic and onions. ? Horseradish. ? Spicy and acidic foods, including peppers, chili powder, curry powder, vinegar, hot sauces, and barbecue sauce. ? Citrus fruit juices and citrus fruits, such as oranges, lemons, and limes. ? Tomato-based foods, such as red sauce, chili, salsa, and pizza with red sauce. ? Fried and fatty foods, such as donuts, french fries, potato chips, and high-fat dressings. ? High-fat meats, such as hot dogs and fatty cuts of red and white meats, such as rib eye steak, sausage, ham, and bacon. ? High-fat dairy items, such as whole milk, butter, and cream cheese.  Eat small, frequent meals instead of large meals.  Avoid drinking large amounts of liquid with your meals.  Avoid eating meals during the 2-3 hours before bedtime.  Avoid lying down right after you eat.  Do not exercise right after you eat. Lifestyle   Do not use any products that contain nicotine or tobacco, such as cigarettes, e-cigarettes, and chewing tobacco. If you need help quitting, ask your health care provider.  Try to reduce your stress by using methods such as yoga or meditation. If you need help reducing stress, ask your health care provider.  If you are overweight, reduce your weight to an amount that is healthy for you. Ask your health care provider for guidance about a safe weight loss goal. General instructions  Pay attention to any changes in your symptoms.  Take over-the-counter and prescription medicines only as told by your health care provider. Do not take aspirin, ibuprofen, or other NSAIDs unless your health care provider told you to do so.  Wear loose-fitting clothing. Do not wear anything tight around your waist  that causes pressure on your abdomen.  Raise (elevate) the head of your bed about 6 inches (15 cm).  Avoid bending over if this makes your symptoms worse.  Keep all follow-up visits as told by your health care provider. This is important. Contact a health care provider if:  You have: ? New symptoms. ? Unexplained weight loss. ? Difficulty swallowing or it hurts to swallow. ? Wheezing or a persistent cough. ? A hoarse voice.  Your symptoms do not improve with treatment. Get help right away if you:  Have pain in your arms, neck, jaw, teeth, or back.  Feel sweaty, dizzy, or light-headed.  Have chest pain or shortness of breath.  Vomit and your vomit looks like blood or coffee grounds.  Faint.  Have stool that is bloody or black.  Cannot swallow, drink, or eat. Summary  Gastroesophageal reflux happens when acid from the stomach flows up into the esophagus. GERD is a disease in which the reflux happens often, causes frequent or severe symptoms, or causes problems such as damage to the esophagus.  Treatment for this condition may vary depending on how severe your symptoms are. Your health care provider may recommend diet and lifestyle changes, medicine, or surgery.  Contact a health care provider if you have new or worsening symptoms.  Take over-the-counter and prescription medicines only as told by your health care provider. Do not take aspirin, ibuprofen, or other NSAIDs unless your health care provider told you to do so.  Keep all follow-up visits as told by your health care provider. This is important. This information is not intended to replace advice given to you by your health care provider. Make sure you discuss any questions you have with your health care provider. Document Revised: 02/12/2018 Document Reviewed: 02/12/2018 Elsevier Patient Education  [place].    Lactose-Free Diet, Adult If you have lactose intolerance, you are not able to digest  lactose. Lactose is a natural sugar found mainly in dairy milk and dairy products. You may need to avoid all foods and beverages that contain lactose. A lactose-free diet can help you do this. Which foods have lactose? Lactose is found in dairy milk and dairy products, such as:  Yogurt.  Cheese.  Butter.  Margarine.  Sour cream.  Cream.  Whipped toppings and nondairy creamers.  Ice cream and other dairy-based desserts. Lactose is also found in foods or products made with dairy milk or milk ingredients. To find out whether a food contains dairy milk or a milk ingredient, look at the ingredients list. Avoid foods with the statement "May contain milk" and foods that contain:  Milk powder.  Whey.  Curd.  Caseinate.  Lactose.  Lactalbumin.  Lactoglobulin. What are alternatives to dairy milk and foods made with milk products?  Lactose-free milk.  Soy milk with added calcium and vitamin D.  Almond milk, coconut milk, rice milk, or other nondairy milk alternatives with added calcium and vitamin D. Note that these are low in protein.  Soy products, such as soy yogurt, soy cheese, soy ice cream, and soy-based sour cream.  Other nut milk products, such as almond yogurt, almond cheese, cashew yogurt, cashew cheese, cashew ice cream, coconut yogurt, and coconut ice cream. What are tips for following this plan?  Do not consume foods, beverages, vitamins, minerals, or medicines containing lactose. Read ingredient lists carefully.  Look for the words "lactose-free" on labels.  Use lactase enzyme drops or tablets as directed by your health care provider.  Use lactose-free milk or a milk alternative, such as soy milk or almond milk, for drinking and cooking.  Make sure you get enough calcium and vitamin D in your diet. A lactose-free eating plan can be lacking in these important nutrients.  Take calcium and vitamin D supplements as directed by your health care provider. Talk to  your health care provider about supplements if you are not able to get enough calcium and vitamin D from food. What foods can I eat?  Fruits All fresh, canned, frozen, or dried fruits that are not processed with lactose. Vegetables All fresh, frozen, and canned vegetables without cheese, cream, or butter sauces. Grains Any that are not made with dairy milk or dairy products. Meats and other proteins Any meat, fish, poultry, and other protein sources that are not made with dairy milk or dairy products. Soy cheese and yogurt. Fats and oils Any that are not made with dairy milk or dairy products. Beverages Lactose-free milk. Soy, rice, or almond milk with added calcium and vitamin D. Fruit and vegetable juices. Sweets and desserts Any that are not made with dairy milk or dairy products. Seasonings and condiments Any that are not made with dairy milk or dairy products. Calcium  Calcium is found in many foods that contain lactose and is important for bone health. The amount of calcium you need depends on your age:  Adults younger than 50 years: 1,000 mg of calcium a day.  Adults older than 50 years: 1,200 mg of calcium a day. If you are not getting enough calcium, you may get it from other sources, including:  Orange juice with calcium added. There are 300-350 mg of calcium in 1 cup of orange juice.  Calcium-fortified soy milk. There are 300-400 mg of calcium in 1 cup of calcium-fortified soy milk.  Calcium-fortified rice or almond milk. There are 300 mg of calcium in 1 cup of calcium-fortified rice or almond milk.  Calcium-fortified breakfast cereals. There are 100-1,000 mg of calcium in calcium-fortified breakfast cereals.  Spinach, cooked. There are 145 mg of calcium in  cup of cooked spinach.  Edamame, cooked. There are 130 mg of calcium in  cup of cooked edamame.  Collard greens, cooked. There are 125 mg of calcium in  cup of cooked collard greens.  Kale, frozen or cooked.  There are 90 mg of calcium in  cup of cooked or frozen kale.  Almonds. There are 95 mg of calcium in  cup of almonds.  Broccoli, cooked. There are 60 mg of calcium in 1 cup of cooked broccoli. The items listed above may not be a complete list of recommended foods and beverages. Contact a dietitian for more options. What foods are not recommended? Fruits None, unless they are made with dairy milk or dairy products. Vegetables None, unless they are made with dairy milk or dairy products. Grains Any grains that are made with dairy milk or dairy products. Meats and other proteins None, unless they are made with dairy milk or dairy products. Dairy All dairy products, including milk, goat's milk, buttermilk, kefir, acidophilus milk, flavored milk, evaporated milk, condensed milk, dulce de [place], eggnog, yogurt, cheese, and cheese spreads. Fats and oils Any that are made with milk or milk products. Margarines and salad dressings that contain milk or cheese. Cream. Half and half. Cream cheese. Sour cream. Chip dips made with sour cream or yogurt. Beverages Hot chocolate. Cocoa with lactose. Instant iced teas. Powdered fruit drinks. Smoothies made with dairy milk or yogurt. Sweets and desserts Any that are made with milk or milk products. Seasonings and condiments Chewing gum that has lactose. Spice blends if they contain lactose. Artificial sweeteners that contain lactose. Nondairy creamers. The items listed above may not be a complete list of foods and beverages to avoid. Contact a dietitian for more information. Summary  If you are lactose intolerant, it means that you have a hard time digesting lactose, a natural sugar found in milk and milk products.  Following a lactose-free diet can help you manage this condition.  Calcium is important for bone health and is found in many foods that contain lactose. Talk with your health care provider about other sources of calcium. This information  is not intended to replace advice given to you by your health care provider. Make sure you discuss any questions you have with your health care provider. Document Revised: 09/03/2017 Document Reviewed: 09/03/2017 Elsevier Patient Education  [place].  I appreciate the  opportunity to care for you  Thank You   Harl Bowie , MD

## 2019-09-17 NOTE — Progress Notes (Signed)
Jane [name]    [id number]    09-30-1955  Primary Care Physician:Reives, Derl Barrow, NP  Referring Physician: Jasmine December, NP [place],  [place] [address]   Chief complaint:  GERD, heartburn, dyspepsia  HPI: 64 year old female with history of chronic GERD and hiatal hernia here for follow-up visit with complaints of breakthrough heartburn. She is taking over-the-counter Nexium 40mg  daily She continues to have intermittent severe hearburn once every other week, exacerbated mostly when she eats pizza, drinks soda or greasy food. She continues to have intermittent dyspepsia, abdominal fullness, globus sensation and feeling of food hung up in her lower chest almost daily. She is drinking lactose-free milk.  Denies any change in bowel habits, constipation, diarrhea, melena or blood per rectum.  No nausea or vomiting.  No decreased appetite or weight loss.  Colonoscopy February 13, 2019: 2 sessile polyps [tubular adenoma] removed from transverse colon, sigmoid diverticulosis and internal hemorrhoids  EGD at [facility] by Dr. Darrel Hoover in 04/2015 at which time she was found to have a small hiatal hernia, a suspected small fundic gland polyp, and some erythema in the duodenum. Gastric biopsies actually just showed oxyntic mucosa with PPI effect and foveolar hyperplasia. Duodenal biopsies were normal. Biopsies results are in [place].  She had a colonoscopy in 2010 by Dr. Collene Mares that she says was normal and repeat was recommended in 10 years from that time.    Outpatient Encounter Medications as of 09/17/2019  Medication Sig   alum & mag hydroxide-simeth (MAALOX/MYLANTA) 200-200-20 MG/5ML suspension Take 15 mLs by mouth every 6 (six) hours as needed for indigestion or heartburn.   B Complex Vitamins (VITAMIN-B COMPLEX) TABS Take 1 tablet by mouth daily.    Cholecalciferol (D 1000) 1000 units capsule Take 1,000 Units by mouth daily.    docusate sodium (COLACE) 100 MG  capsule Take 1 capsule (100 mg total) by mouth every 12 (twelve) hours.   esomeprazole (NEXIUM) 20 MG capsule Take 20 mg by mouth 2 (two) times daily before a meal.   hydrochlorothiazide (HYDRODIURIL) 25 MG tablet Take 25 mg by mouth daily.   hydrOXYzine (ATARAX/VISTARIL) 25 MG tablet Take 1 tablet (25 mg total) by mouth every 6 (six) hours.   lisinopril-hydrochlorothiazide (PRINZIDE,ZESTORETIC) 20-12.5 MG tablet Take 1 tablet by mouth daily.   MAGNESIUM-CALCIUM-FOLIC ACID PO Take by mouth.   Multiple Vitamin (MULTIVITAMIN WITH MINERALS) TABS tablet Take 1 tablet by mouth daily.   naproxen (EC NAPROSYN) 500 MG EC tablet naproxen 500 mg tablet,delayed release  take one tablet one a day   polyethylene glycol (MIRALAX / GLYCOLAX) 17 g packet Take 17 g by mouth daily.   [DISCONTINUED] esomeprazole (NEXIUM) 20 MG capsule Take 20 mg by mouth 2 (two) times daily before a meal.    [DISCONTINUED] pantoprazole (PROTONIX) 40 MG tablet Take 1 tablet (40 mg total) by mouth daily. (Patient not taking: Reported on 02/13/2019)   Facility-Administered Encounter Medications as of 09/17/2019  Medication   0.9 %  sodium chloride infusion    Allergies as of 09/17/2019   (No Known Allergies)    Past Medical History:  Diagnosis Date   Acid reflux    Arthritis    Hypertension    Reflux    Vasculitis (HCC)     Past Surgical History:  Procedure Laterality Date   COLONOSCOPY     TUBAL LIGATION      Family History  Problem Relation Age of Onset  Alcoholism Mother    Lung disease Maternal Grandmother    Diabetes Maternal Uncle    Colon cancer Neg Hx    Stomach cancer Neg Hx     Social History   Socioeconomic History   Marital status: Divorced    Spouse name: Not on file   Number of children: Not on file   Years of education: Not on file   Highest education level: Not on file  Occupational History   Occupation: Chartered certified accountant  Tobacco Use   Smoking status: Never  Smoker   Smokeless tobacco: Never Used  Substance and Sexual Activity   Alcohol use: No    Alcohol/week: 0.0 standard drinks   Drug use: No   Sexual activity: Not on file  Other Topics Concern   Not on file  Social History Narrative   Not on file   Social Determinants of Health   Financial Resource Strain:    Difficulty of Paying Living Expenses: Not on file  Food Insecurity:    Worried About Charity fundraiser in the Last Year: Not on file   [place] in the Last Year: Not on file  Transportation Needs:    Lack of Transportation (Medical): Not on file   Lack of Transportation (Non-Medical): Not on file  Physical Activity:    Days of Exercise per Week: Not on file   Minutes of Exercise per Session: Not on file  Stress:    Feeling of Stress : Not on file  Social Connections:    Frequency of Communication with Friends and Family: Not on file   Frequency of Social Gatherings with Friends and Family: Not on file   Attends Religious Services: Not on file   Active Member of Clubs or Organizations: Not on file   Attends Archivist Meetings: Not on file   Marital Status: Not on file  Intimate Partner Violence:    Fear of Current or Ex-Partner: Not on file   Emotionally Abused: Not on file   Physically Abused: Not on file   Sexually Abused: Not on file      Review of systems: Review of Systems  Constitutional: Negative for fever and chills.  HENT: Negative.   Eyes: Negative for blurred vision.  Respiratory: Negative for cough, shortness of breath and wheezing.   Cardiovascular: Negative for chest pain and palpitations.  Gastrointestinal: as per HPI Genitourinary: Negative for dysuria, urgency, frequency and hematuria.  Musculoskeletal: Negative for myalgias, back pain and joint pain.  Skin: Negative for itching and rash.  Neurological: Negative for dizziness, tremors, focal weakness, seizures and loss of consciousness.    Endo/Heme/Allergies: Positive for seasonal allergies.  Psychiatric/Behavioral: Negative for depression, suicidal ideas and hallucinations.  All other systems reviewed and are negative.   Physical Exam: Vitals:   09/17/19 0822  BP: 132/80  Pulse: 68  Temp: 97.8 F (36.6 C)   Body mass index is 38.11 kg/m. Gen:      No acute distress HEENT:  EOMI, sclera anicteric Neck:     No masses; no thyromegaly Lungs:    Clear to auscultation bilaterally; normal respiratory effort CV:         Regular rate and rhythm; no murmurs Abd:      + bowel sounds; soft, non-tender; no palpable masses, no distension Ext:    No edema; adequate peripheral perfusion Skin:      Warm and dry; no rash Neuro: alert and oriented x 3 Psych: normal mood and affect  Data Reviewed:  Reviewed labs, radiology imaging, old records and pertinent past GI work up   Assessment and Plan/Recommendations:  64 year old female with obesity, GERD, hiatal hernia with complaints of persistent breakthrough heartburn, globus sensation and dysphagia  Continue Nexium 40 mg daily Discussed antireflux measures and lifestyle modifications  Schedule for EGD for evaluation with esophageal biopsies and dilation if needed  Dyspepsia could be secondary to lactose intolerance Discussed lactose-free diet We will also obtain gastric biopsies to exclude H. Pylori  History of adenomatous colon polyps, due for surveillance colonoscopy June 2027  Return in 6 months or sooner if needed  The risks and benefits as well as alternatives of endoscopic procedure(s) have been discussed and reviewed. All questions answered. The patient agrees to proceed.   The patient was provided an opportunity to ask questions and all were answered. The patient agreed with the plan and demonstrated an understanding of the instructions.  Damaris Hippo , MD    CC: Jasmine December, NP

## 2019-10-01 ENCOUNTER — Ambulatory Visit: Payer: Medicare Other | Admitting: Family Medicine

## 2019-10-09 ENCOUNTER — Encounter: Payer: Medicare Other | Admitting: Gastroenterology

## 2019-10-10 IMAGING — DX DG CHEST 2V
2 series · 2 of 2 positions shown · non-contrast
Comparison: 04/26/2017 chest radiograph.

CLINICAL DATA: Palpitations.  Lightheadedness.

EXAM:
CHEST - 2 VIEW

[chest pa]
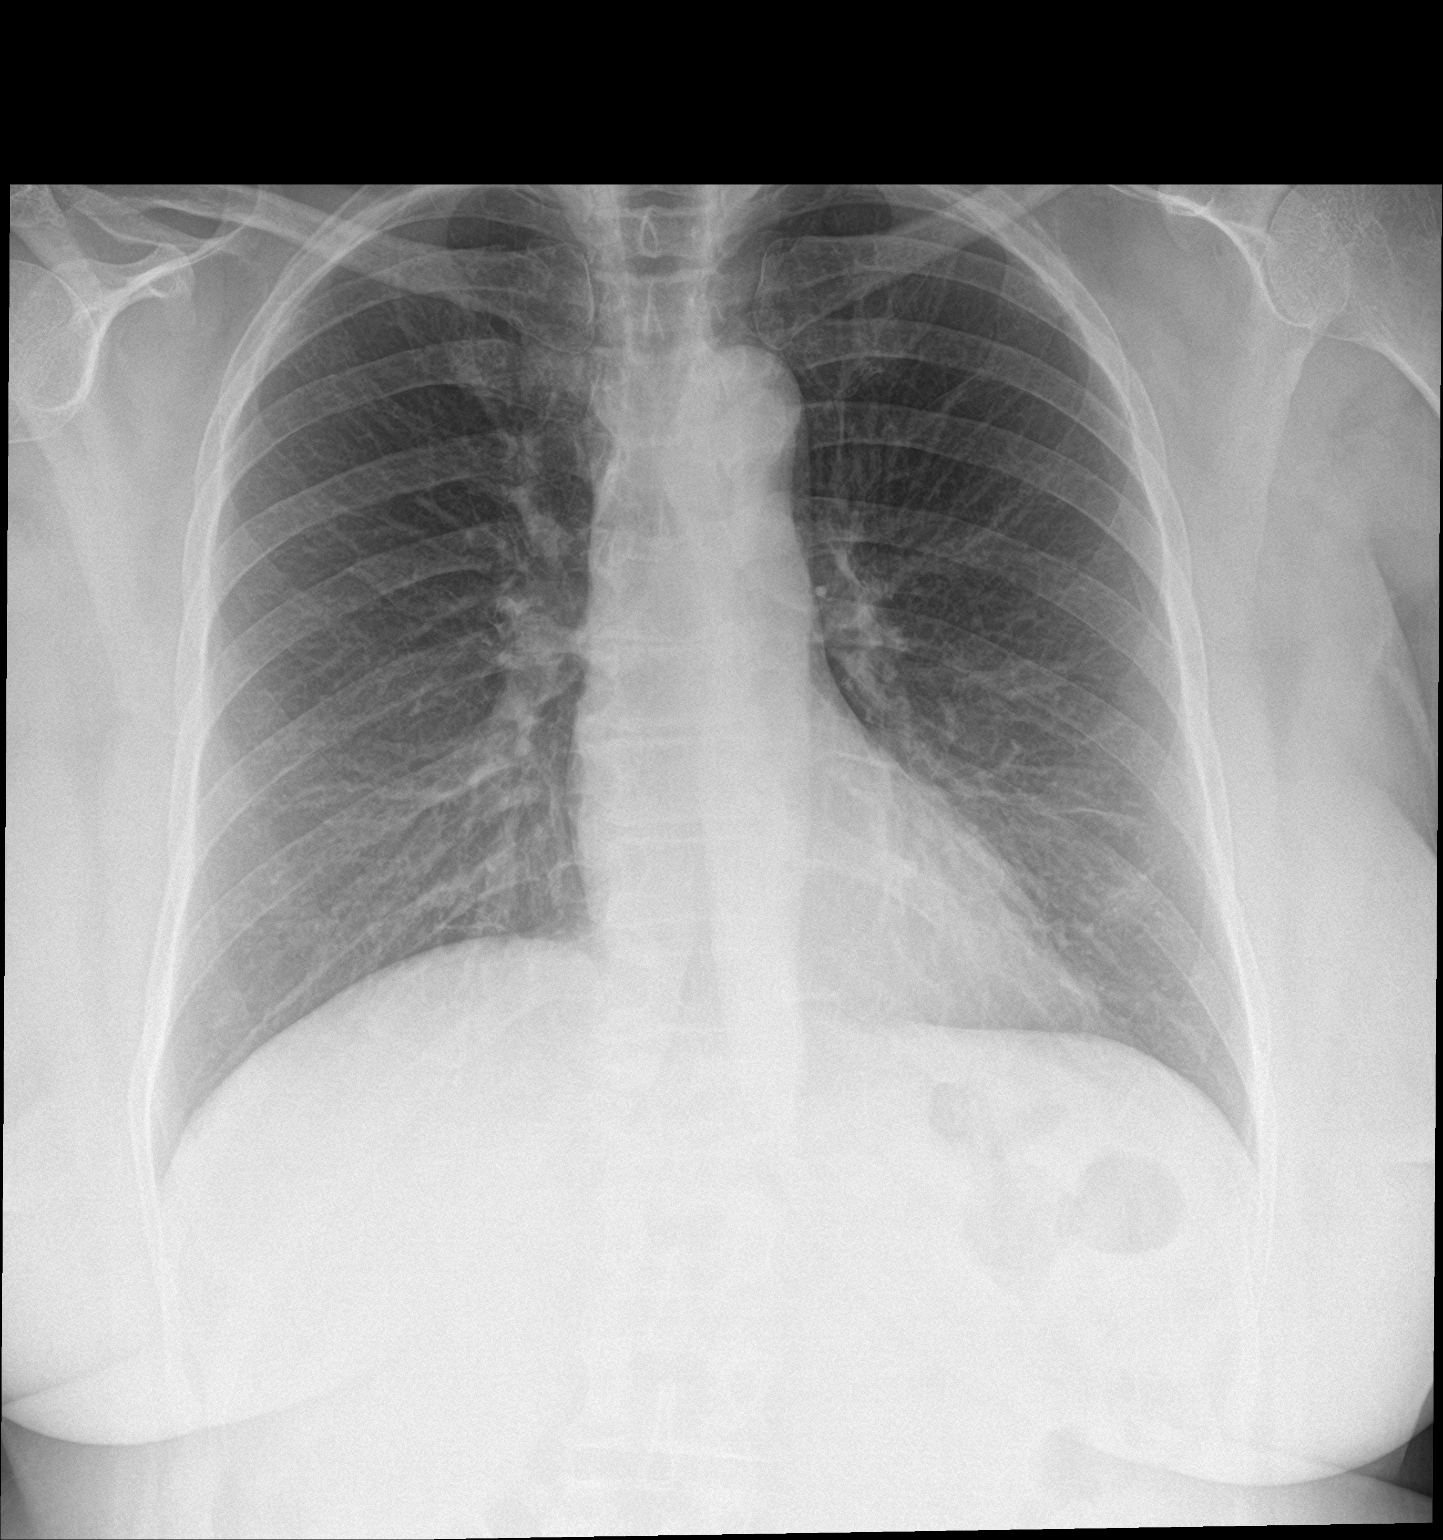

[chest lat]
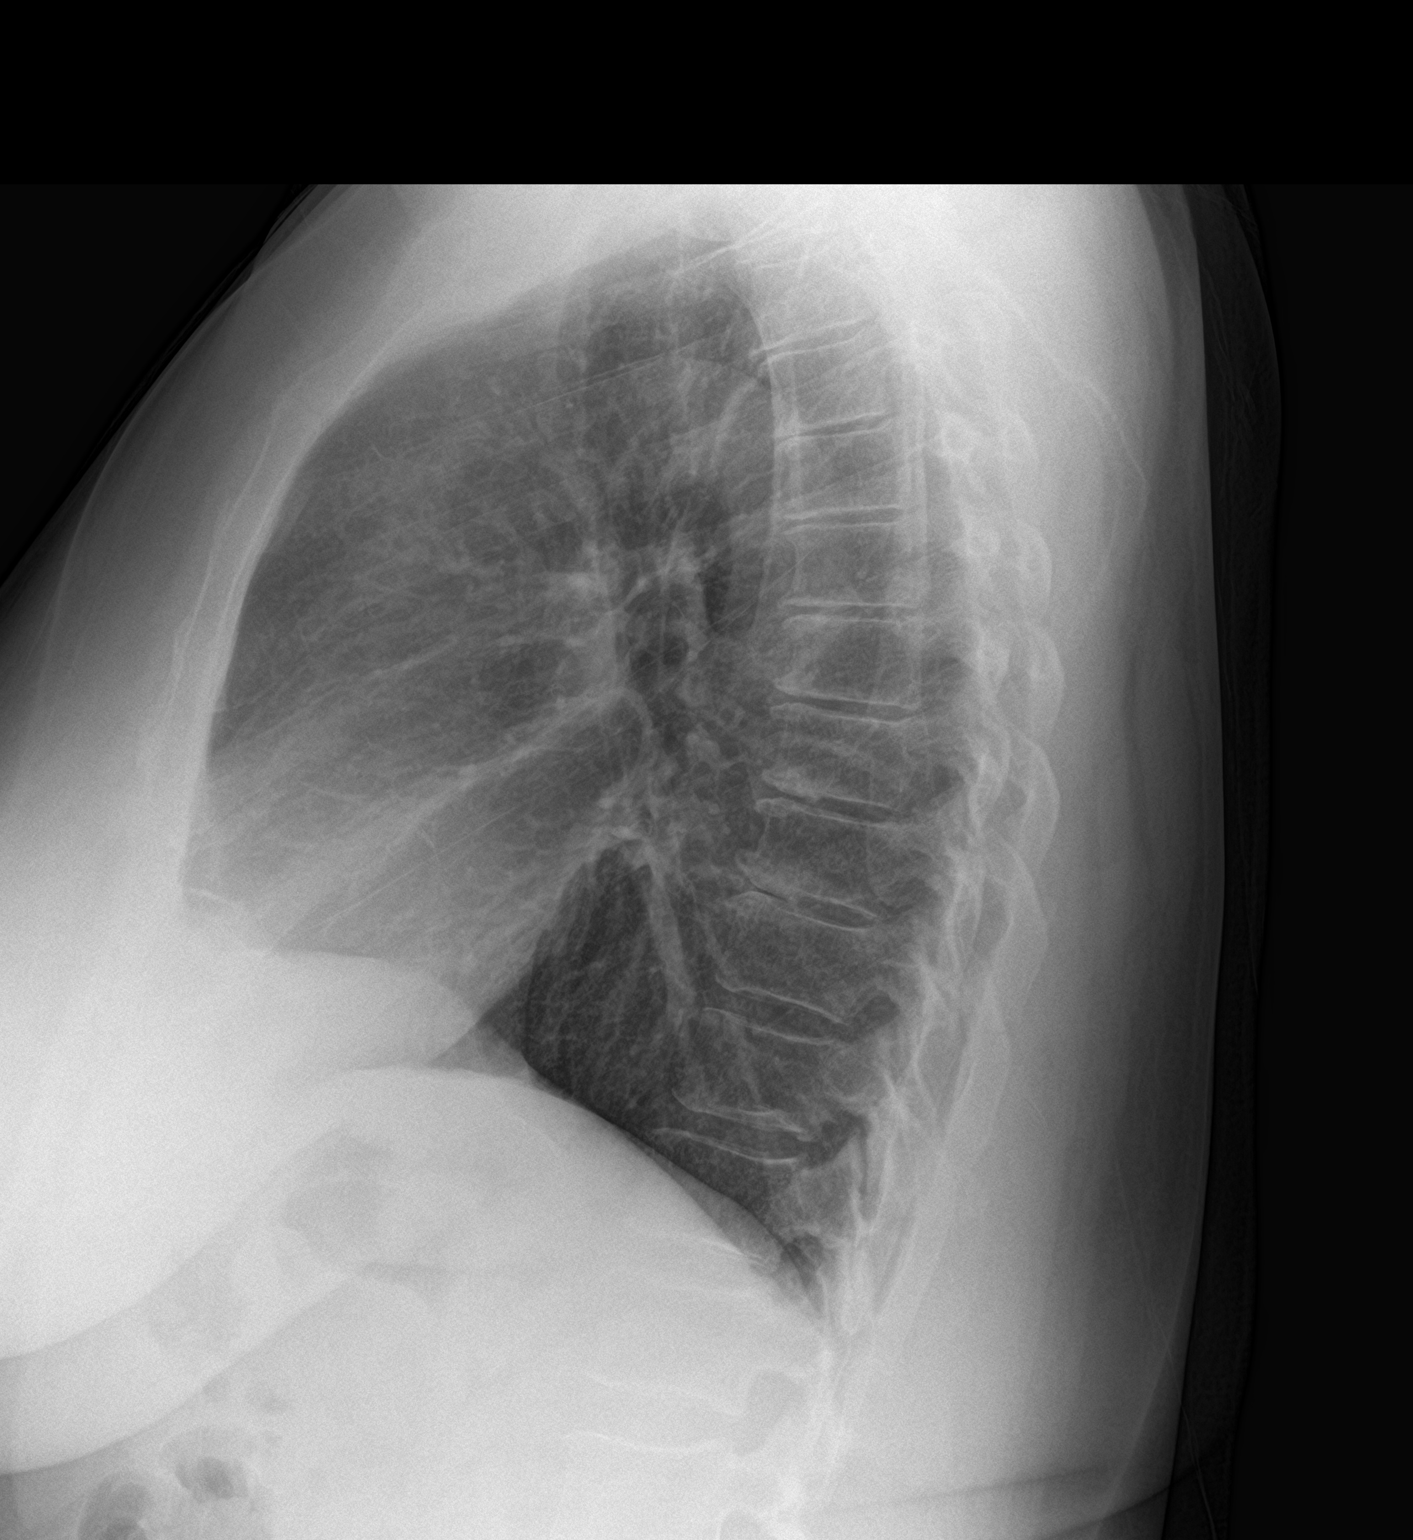

[2 of 2 positions shown; findings below may reference images not displayed]

FINDINGS: Stable cardiomediastinal silhouette with normal heart size. No
pneumothorax. No pleural effusion. Stable minimal scarring in the
lingula. No pulmonary edema. No acute consolidative airspace
disease.
IMPRESSION: No active cardiopulmonary disease.

## 2019-10-16 ENCOUNTER — Ambulatory Visit: Payer: Medicare Other | Admitting: Family Medicine

## 2019-10-28 ENCOUNTER — Telehealth: Payer: Self-pay | Admitting: Family Medicine

## 2019-10-28 NOTE — Telephone Encounter (Signed)
Left voicemail for patient RE: Need to reschedule cancelled appt from 10/16/19 for Dr. Bryan Lemma.

## 2019-11-03 ENCOUNTER — Encounter (HOSPITAL_COMMUNITY): Payer: Self-pay | Admitting: Emergency Medicine

## 2019-11-03 ENCOUNTER — Emergency Department (HOSPITAL_COMMUNITY)
Admission: EM | Admit: 2019-11-03 | Discharge: 2019-11-03 | Disposition: A | Discharge status: Home / Self Care | Payer: Medicare Other | Attending: Emergency Medicine | Admitting: Emergency Medicine

## 2019-11-03 ENCOUNTER — Other Ambulatory Visit: Payer: Self-pay

## 2019-11-03 DIAGNOSIS — Z79899 Other long term (current) drug therapy: Secondary | ICD-10-CM | POA: Insufficient documentation

## 2019-11-03 DIAGNOSIS — I10 Essential (primary) hypertension: Secondary | ICD-10-CM | POA: Insufficient documentation

## 2019-11-03 DIAGNOSIS — R1013 Epigastric pain: Secondary | ICD-10-CM | POA: Insufficient documentation

## 2019-11-03 DIAGNOSIS — R1310 Dysphagia, unspecified: Secondary | ICD-10-CM | POA: Diagnosis not present

## 2019-11-03 LAB — LIPASE, BLOOD: Lipase: 23 U/L (ref 11–51)

## 2019-11-03 LAB — COMPREHENSIVE METABOLIC PANEL
ALT: 25 U/L (ref 0–44)
AST: 30 U/L (ref 15–41)
Albumin: 4.2 g/dL (ref 3.5–5.0)
Alkaline Phosphatase: 100 U/L (ref 38–126)
Anion gap: 13 (ref 5–15)
BUN: 14 mg/dL (ref 8–23)
CO2: 26 mmol/L (ref 22–32)
Calcium: 9.5 mg/dL (ref 8.9–10.3)
Chloride: 100 mmol/L (ref 98–111)
Creatinine, Ser: 0.85 mg/dL (ref 0.44–1.00)
GFR calc Af Amer: >60 mL/min (ref >60)
GFR calc non Af Amer: >60 mL/min (ref >60)
Glucose, Bld: 83 mg/dL (ref 70–99)
Potassium: 3.9 mmol/L (ref 3.5–5.1)
Sodium: 139 mmol/L (ref 135–145)
Total Bilirubin: 0.9 mg/dL (ref 0.3–1.2)
Total Protein: 7.8 g/dL (ref 6.5–8.1)

## 2019-11-03 LAB — CBC
HCT: 38.6 % (ref 36.0–46.0)
Hemoglobin: 12.5 g/dL (ref 12.0–15.0)
MCH: 30 pg (ref 26.0–34.0)
MCHC: 32.4 g/dL (ref 30.0–36.0)
MCV: 92.6 fL (ref 80.0–100.0)
Platelets: 202 10*3/uL (ref 150–400)
RBC: 4.17 MIL/uL (ref 3.87–5.11)
RDW: 12.7 % (ref 11.5–15.5)
WBC: 8.3 10*3/uL (ref 4.0–10.5)
nRBC: 0 % (ref 0.0–0.2)

## 2019-11-03 LAB — URINALYSIS, ROUTINE W REFLEX MICROSCOPIC
Bilirubin Urine: NEGATIVE
Glucose, UA: NEGATIVE mg/dL
Hgb urine dipstick: NEGATIVE
Ketones, ur: NEGATIVE mg/dL
Nitrite: NEGATIVE
Protein, ur: NEGATIVE mg/dL
Specific Gravity, Urine: 1.009 (ref 1.005–1.030)
pH: 5 (ref 5.0–8.0)

## 2019-11-03 MED ORDER — SODIUM CHLORIDE 0.9% FLUSH: 3.0000 mL | Freq: Once | INTRAVENOUS | Status: DC

## 2019-11-03 NOTE — Discharge Instructions (Addendum)
The test today looked good.  There is no sign of problems that require further treatment or hospitalization at this time.  It would be very helpful for your gastroenterologist to do the EGD, to look into your problem further.  In the meantime continue taking the Nexium and supplemented with the antacid of your choice.  Try to drink plenty of fluids, and stay on a relatively bland diet.  Return here, if needed, for problems.

## 2019-11-03 NOTE — ED Triage Notes (Signed)
Pt arrives to ED from home with complaints of generalized abnormal pain and bloating since January. Patient was scheduled for an endoscopy by her PCP but canceled.

## 2019-11-03 NOTE — ED Provider Notes (Signed)
[facility] EMERGENCY DEPARTMENT Provider Note   CSN: [id number] Arrival date & time: 11/03/19  1405     History Chief Complaint  Patient presents with  . Abdominal Pain  . Bloated    Jane [name] is a 64 y.o. female.  HPI She is here for ongoing symptoms of abdominal pain which come and go, present for several months.  She recently stopped taking her prescription PPI in lieu of taking OTC Nexium.  Last week she ate a hot dog, some lower abdominal discomfort for a few days.  She is having normal bowel movements without change in color, consistency or volume.  She denies dysuria.  She denies fever or chills.  There has been no cough, shortness of breath or chest pain.  She was due to have endoscopy in February but had to cancel because she had no one to stay with her during the procedure.  There are no other known modifying factors.    Past Medical History:  Diagnosis Date  . Acid reflux   . Arthritis   . Hypertension   . Reflux   . Vasculitis [facility])     Patient Active Problem List   Diagnosis Date Noted  . Gastroesophageal reflux disease 01/29/2018  . Abdominal pain, epigastric 01/29/2018  . Plantar fasciitis, bilateral 02/29/2016  . Metatarsal deformity 02/29/2016  . Tenosynovitis of foot 02/29/2016    Past Surgical History:  Procedure Laterality Date  . COLONOSCOPY    . TUBAL LIGATION       OB History   No obstetric history on file.     Family History  Problem Relation Age of Onset  . Alcoholism Mother   . Lung disease Maternal Grandmother   . Diabetes Maternal Uncle   . Colon cancer Neg Hx   . Stomach cancer Neg Hx     Social History   Tobacco Use  . Smoking status: Never Smoker  . Smokeless tobacco: Never Used  Substance Use Topics  . Alcohol use: No    Alcohol/week: 0.0 standard drinks  . Drug use: No    Home Medications Prior to Admission medications   Medication Sig Start Date End Date Taking? Authorizing Provider  alum  & mag hydroxide-simeth (MAALOX/MYLANTA) 200-200-20 MG/5ML suspension Take 15 mLs by mouth every 6 (six) hours as needed for indigestion or heartburn. 12/21/18   Wieters, Hallie C, PA-C  B Complex Vitamins (VITAMIN-B COMPLEX) TABS Take 1 tablet by mouth daily.     [provider]  Cholecalciferol (D 1000) 1000 units capsule Take 1,000 Units by mouth daily.     [provider]  docusate sodium (COLACE) 100 MG capsule Take 1 capsule (100 mg total) by mouth every 12 (twelve) hours. 12/21/18   Wieters, Hallie C, PA-C  esomeprazole (NEXIUM) 40 MG capsule Take 1 capsule (40 mg total) by mouth daily at 12 noon. 09/17/19   Mauri Pole, MD  hydrochlorothiazide (HYDRODIURIL) 25 MG tablet Take 25 mg by mouth daily.    [provider]  hydrOXYzine (ATARAX/VISTARIL) 25 MG tablet Take 1 tablet (25 mg total) by mouth every 6 (six) hours. 11/29/17   Moss, Amber, DO  lisinopril-hydrochlorothiazide (PRINZIDE,ZESTORETIC) 20-12.5 MG tablet Take 1 tablet by mouth daily.    [provider]  [place] ACID PO Take by mouth.    [provider]  Multiple Vitamin (MULTIVITAMIN WITH MINERALS) TABS tablet Take 1 tablet by mouth daily.    [provider]  naproxen (EC NAPROSYN) 500 MG EC  tablet naproxen 500 mg tablet,delayed release  take one tablet one a day 06/25/18   Vanessa Kick, MD  polyethylene glycol (MIRALAX / GLYCOLAX) 17 g packet Take 17 g by mouth daily. 12/21/18   Wieters, Hallie C, PA-C    Allergies    Patient has no known allergies.  Review of Systems   Review of Systems  All other systems reviewed and are negative.   Physical Exam Updated Vital Signs BP (!) 168/73   Pulse 90   Temp 98.5 F (36.9 C) (Oral)   Resp 19   SpO2 99%   Physical Exam Vitals and nursing note reviewed.  Constitutional:      General: She is not in acute distress.    Appearance: She is well-developed. She is not ill-appearing, toxic-appearing or diaphoretic.   HENT:     Head: Normocephalic and atraumatic.     Right Ear: External ear normal.     Left Ear: External ear normal.  Eyes:     Conjunctiva/sclera: Conjunctivae normal.     Pupils: Pupils are equal, round, and reactive to light.  Neck:     Trachea: Phonation normal.  Cardiovascular:     Rate and Rhythm: Normal rate and regular rhythm.     Heart sounds: Normal heart sounds.  Pulmonary:     Effort: Pulmonary effort is normal. No respiratory distress.     Breath sounds: Normal breath sounds. No stridor. No rhonchi.  Chest:     Chest wall: No tenderness.  Abdominal:     General: There is no distension.     Palpations: Abdomen is soft. There is no mass.     Tenderness: There is abdominal tenderness (Epigastric, mild). There is no guarding.     Hernia: No hernia is present.     Comments: The abdomen is soft.  There are no areas of focal tenderness.  Musculoskeletal:        General: Normal range of motion.     Cervical back: Normal range of motion and neck supple.  Skin:    General: Skin is warm and dry.  Neurological:     Mental Status: She is alert and oriented to person, place, and time.     Cranial Nerves: No cranial nerve deficit.     Sensory: No sensory deficit.     Motor: No abnormal muscle tone.     Coordination: Coordination normal.  Psychiatric:        Mood and Affect: Mood normal.        Behavior: Behavior normal.        Thought Content: Thought content normal.        Judgment: Judgment normal.     ED Results / Procedures / Treatments   Labs (all labs ordered are listed, but only abnormal results are displayed) Labs Reviewed  URINALYSIS, ROUTINE W REFLEX MICROSCOPIC - Abnormal; Notable for the following components:      Result Value   Color, Urine STRAW (*)    Leukocytes,Ua TRACE (*)    Bacteria, UA RARE (*)    All other components within normal limits  LIPASE, BLOOD  COMPREHENSIVE METABOLIC PANEL  CBC    EKG None  Radiology No results  found.  Procedures Procedures (including critical care time)  Medications Ordered in ED Medications  sodium chloride flush (NS) 0.9 % injection 3 mL (has no administration in time range)    ED Course  I have reviewed the triage vital signs and the nursing notes.  Pertinent labs &  imaging results that were available during my care of the patient were reviewed by me and considered in my medical decision making (see chart for details).  Clinical Course as of Nov 03 1702  Tue Nov 03, 2019  1659 Normal  Urinalysis, Routine w reflex microscopic(!) [EW]  1659 Normal  Lipase, blood [EW]  1659 Normal   Comprehensive metabolic panel [EW]  123456 Normal  CBC [EW]    Clinical Course User Index [EW] Daleen Bo, MD   MDM Rules/Calculators/A&P                       Patient Vitals for the past 24 hrs:  BP Temp Temp src Pulse Resp SpO2  11/03/19 1700 (!) 168/73 -- -- -- 19 --  11/03/19 1645 (!) 170/89 -- -- -- 18 --  11/03/19 1630 (!) 151/70 -- -- -- (!) 25 --  11/03/19 1408 137/70 98.5 F (36.9 C) Oral 90 20 99 %    4:59 PM Reevaluation with update and discussion. After initial assessment and treatment, an updated evaluation reveals no change in clinical findings.  Findings discussed with the patient and all questions were answered. Daleen Bo   Medical Decision Making: Ongoing abdominal pain with reassuring clinical, and laboratory evaluation.  Patient with history of dyspepsia symptoms, and follows with gastroenterology.  She was due to have a EGD, but canceled it last month.  There is no sign of acute infectious, metabolic or hemodynamic disorders.  No indication for further ED intervention or hospitalization at this time.  Jane [name] was evaluated in Emergency Department on 11/03/2019 for the symptoms described in the history of present illness. She was evaluated in the context of the global COVID-19 pandemic, which necessitated consideration that the patient might be at risk  for infection with the SARS-CoV-2 virus that causes COVID-19. Institutional protocols and algorithms that pertain to the evaluation of patients at risk for COVID-19 are in a state of rapid change based on information released by regulatory bodies including the CDC and federal and state organizations. These policies and algorithms were followed during the patient's care in the ED.   CRITICAL CARE- No Performed by: Daleen Bo   Nursing Notes Reviewed/ Care Coordinated Applicable Imaging Reviewed Interpretation of Laboratory Data incorporated into ED treatment  The patient appears reasonably screened and/or stabilized for discharge and I doubt any other medical condition or other [facility] requiring further screening, evaluation, or treatment in the ED at this time prior to discharge.  Plan: Home Medications-continue usual; Home Treatments-rest, fluids; return here if the recommended treatment, does not improve the symptoms; Recommended follow up-PCP, as needed.  GI as soon as possible for endoscopy    Final Clinical Impression(s) / ED Diagnoses Final diagnoses:  Epigastric pain  Dyspepsia    Rx / DC Orders ED Discharge Orders    None       Daleen Bo, MD 11/03/19 1704

## 2020-01-10 ENCOUNTER — Other Ambulatory Visit: Payer: Self-pay

## 2020-01-10 ENCOUNTER — Ambulatory Visit (HOSPITAL_COMMUNITY)
Admission: EM | Admit: 2020-01-10 | Discharge: 2020-01-10 | Disposition: A | Discharge status: Home / Self Care | Payer: Medicare Other | Attending: Family Medicine | Admitting: Family Medicine

## 2020-01-10 ENCOUNTER — Encounter (HOSPITAL_COMMUNITY): Payer: Self-pay | Admitting: Emergency Medicine

## 2020-01-10 DIAGNOSIS — Z760 Encounter for issue of repeat prescription: Secondary | ICD-10-CM

## 2020-01-10 DIAGNOSIS — I10 Essential (primary) hypertension: Secondary | ICD-10-CM

## 2020-01-10 MED ORDER — LISINOPRIL-HYDROCHLOROTHIAZIDE 20-12.5 MG PO TABS: 1.0000 | ORAL_TABLET | Freq: Every day | ORAL | 0 refills | Status: DC

## 2020-01-10 NOTE — ED Provider Notes (Signed)
[facility]    CSN: [id number] Arrival date & time: 01/10/20  1543      History   Chief Complaint Chief Complaint  Patient presents with  . Medication Refill    HPI Jane [name] is a 64 y.o. female.   HPI   Pleasant 64 year old woman with hypertension.  Has been on lisinopril hydrochlorothiazide for a long time.  It has worked successfully for her.  No side effects.   She has no heart disease, chest pain, or shortness of breath.  No headaches.  She found out today that she had taken her last pill and thought that she had more in the cabinet.  She is here for medicine refill and will call her primary care doctor tomorrow to be seen.  She anticipates she will be able to get into see her doctor for perhaps a month. I reviewed her medical record.  She had blood work done in March including normal liver and kidney functions. Past Medical History:  Diagnosis Date  . Acid reflux   . Arthritis   . Hypertension   . Reflux   . Vasculitis [facility])     Patient Active Problem List   Diagnosis Date Noted  . Gastroesophageal reflux disease 01/29/2018  . Abdominal pain, epigastric 01/29/2018  . Plantar fasciitis, bilateral 02/29/2016  . Metatarsal deformity 02/29/2016  . Tenosynovitis of foot 02/29/2016    Past Surgical History:  Procedure Laterality Date  . COLONOSCOPY    . TUBAL LIGATION      OB History   No obstetric history on file.      Home Medications    Prior to Admission medications   Medication Sig Start Date End Date Taking? Authorizing Provider  alum & mag hydroxide-simeth (MAALOX/MYLANTA) 200-200-20 MG/5ML suspension Take 15 mLs by mouth every 6 (six) hours as needed for indigestion or heartburn. 12/21/18   Wieters, Hallie C, PA-C  B Complex Vitamins (VITAMIN-B COMPLEX) TABS Take 1 tablet by mouth daily.     [provider]  Cholecalciferol (D 1000) 1000 units capsule Take 1,000 Units by mouth daily.     [provider]  docusate sodium  (COLACE) 100 MG capsule Take 1 capsule (100 mg total) by mouth every 12 (twelve) hours. 12/21/18   Wieters, Hallie C, PA-C  esomeprazole (NEXIUM) 40 MG capsule Take 1 capsule (40 mg total) by mouth daily at 12 noon. 09/17/19   Mauri Pole, MD  hydrOXYzine (ATARAX/VISTARIL) 25 MG tablet Take 1 tablet (25 mg total) by mouth every 6 (six) hours. 11/29/17   Moss, Amber, DO  lisinopril-hydrochlorothiazide (ZESTORETIC) 20-12.5 MG tablet Take 1 tablet by mouth daily. 01/10/20   Raylene Everts, MD  MAGNESIUM-CALCIUM-FOLIC ACID PO Take by mouth.    [provider]  Multiple Vitamin (MULTIVITAMIN WITH MINERALS) TABS tablet Take 1 tablet by mouth daily.    [provider]  naproxen (EC NAPROSYN) 500 MG EC tablet naproxen 500 mg tablet,delayed release  take one tablet one a day 06/25/18   Vanessa Kick, MD  polyethylene glycol (MIRALAX / GLYCOLAX) 17 g packet Take 17 g by mouth daily. 12/21/18   Wieters, Elesa Hacker, PA-C    Family History Family History  Problem Relation Age of Onset  . Alcoholism Mother   . Lung disease Maternal Grandmother   . Diabetes Maternal Uncle   . Colon cancer Neg Hx   . Stomach cancer Neg Hx     Social History Social History   Tobacco Use  .  Smoking status: Never Smoker  . Smokeless tobacco: Never Used  Substance Use Topics  . Alcohol use: No    Alcohol/week: 0.0 standard drinks  . Drug use: No     Allergies   Patient has no known allergies.   Review of Systems Review of Systems  Constitutional:       Patient states she feels well     Physical Exam Triage Vital Signs ED Triage Vitals  Enc Vitals Group     BP 01/10/20 1611 124/64     Pulse Rate 01/10/20 1611 75     Resp -- 15     Temp 01/10/20 1611 98.4 F (36.9 C)     Temp Source 01/10/20 1611 Oral     SpO2 01/10/20 1611 100 %     Weight --      Height --      Head Circumference --      Peak Flow --      Pain Score 01/10/20 1615 0     Pain Loc --      Pain Edu? --       Excl. in [place]? --    No data found.  Updated Vital Signs BP 124/64 (BP Location: Left Arm)   Pulse 75   Temp 98.4 F (36.9 C) (Oral)   SpO2 100%     Physical Exam Constitutional:      General: She is not in acute distress.    Appearance: She is well-developed.  HENT:     Head: Normocephalic and atraumatic.  Eyes:     Conjunctiva/sclera: Conjunctivae normal.     Pupils: Pupils are equal, round, and reactive to light.  Cardiovascular:     Rate and Rhythm: Normal rate.     Heart sounds: Normal heart sounds. No murmur.  Pulmonary:     Effort: Pulmonary effort is normal. No respiratory distress.     Breath sounds: Normal breath sounds.  Musculoskeletal:        General: Normal range of motion.     Cervical back: Normal range of motion.     Right lower leg: No edema.     Left lower leg: No edema.  Skin:    General: Skin is warm and dry.  Neurological:     Mental Status: She is alert.  Psychiatric:        Mood and Affect: Mood normal.        Behavior: Behavior normal.      UC Treatments / Results  Labs (all labs ordered are listed, but only abnormal results are displayed) Labs Reviewed - No data to display  EKG   Radiology No results found.  Procedures Procedures (including critical care time)  Medications Ordered in UC Medications - No data to display  Initial Impression / Assessment and Plan / UC Course  I have reviewed the triage vital signs and the nursing notes.  Pertinent labs & imaging results that were available during my care of the patient were reviewed by me and considered in my medical decision making (see chart for details).      Final Clinical Impressions(s) / UC Diagnoses   Final diagnoses:  Essential hypertension  Encounter for medication refill     Discharge Instructions     See your Primary Care doctor for a check up Your Blood Pressure is good   ED Prescriptions    Medication Sig Dispense Auth. Provider    lisinopril-hydrochlorothiazide (ZESTORETIC) 20-12.5 MG tablet Take 1 tablet by mouth daily.  90 tablet Raylene Everts, MD     PDMP not reviewed this encounter.   Raylene Everts, MD 01/10/20 [phone]

## 2020-01-10 NOTE — ED Triage Notes (Signed)
PT needs a BP med refill, she ran out yesterday.

## 2020-01-10 NOTE — ED Triage Notes (Signed)
Needs lisinopril- HCTZ refill

## 2020-01-10 NOTE — Discharge Instructions (Addendum)
See your Primary Care doctor for a check up Your Blood Pressure is good

## 2020-01-14 DIAGNOSIS — M76822 Posterior tibial tendinitis, left leg: Secondary | ICD-10-CM | POA: Diagnosis not present

## 2020-01-15 ENCOUNTER — Other Ambulatory Visit: Payer: Self-pay | Admitting: *Deleted

## 2020-01-15 DIAGNOSIS — Z1211 Encounter for screening for malignant neoplasm of colon: Secondary | ICD-10-CM | POA: Diagnosis not present

## 2020-01-15 DIAGNOSIS — M810 Age-related osteoporosis without current pathological fracture: Secondary | ICD-10-CM

## 2020-01-15 DIAGNOSIS — F419 Anxiety disorder, unspecified: Secondary | ICD-10-CM | POA: Diagnosis not present

## 2020-01-15 DIAGNOSIS — Z79899 Other long term (current) drug therapy: Secondary | ICD-10-CM | POA: Diagnosis not present

## 2020-01-15 DIAGNOSIS — E7800 Pure hypercholesterolemia, unspecified: Secondary | ICD-10-CM | POA: Diagnosis not present

## 2020-01-15 DIAGNOSIS — Z136 Encounter for screening for cardiovascular disorders: Secondary | ICD-10-CM | POA: Diagnosis not present

## 2020-01-15 DIAGNOSIS — Z0000 Encounter for general adult medical examination without abnormal findings: Secondary | ICD-10-CM | POA: Diagnosis not present

## 2020-01-15 DIAGNOSIS — Z1389 Encounter for screening for other disorder: Secondary | ICD-10-CM | POA: Diagnosis not present

## 2020-01-15 DIAGNOSIS — Z1331 Encounter for screening for depression: Secondary | ICD-10-CM | POA: Diagnosis not present

## 2020-01-15 DIAGNOSIS — Z1231 Encounter for screening mammogram for malignant neoplasm of breast: Secondary | ICD-10-CM | POA: Diagnosis not present

## 2020-01-15 DIAGNOSIS — M1990 Unspecified osteoarthritis, unspecified site: Secondary | ICD-10-CM | POA: Diagnosis not present

## 2020-01-15 DIAGNOSIS — I10 Essential (primary) hypertension: Secondary | ICD-10-CM | POA: Diagnosis not present

## 2020-01-15 DIAGNOSIS — Z6835 Body mass index (BMI) 35.0-35.9, adult: Secondary | ICD-10-CM | POA: Diagnosis not present

## 2020-01-19 DIAGNOSIS — M76822 Posterior tibial tendinitis, left leg: Secondary | ICD-10-CM | POA: Diagnosis not present

## 2020-02-16 ENCOUNTER — Other Ambulatory Visit: Payer: Self-pay | Admitting: *Deleted

## 2020-02-16 DIAGNOSIS — Z13820 Encounter for screening for osteoporosis: Secondary | ICD-10-CM

## 2020-02-19 ENCOUNTER — Ambulatory Visit
Admission: RE | Admit: 2020-02-19 | Discharge: 2020-02-19 | Disposition: A | Discharge status: Home / Self Care | Payer: Medicare Other | Source: Ambulatory Visit | Attending: *Deleted | Admitting: *Deleted

## 2020-02-19 ENCOUNTER — Other Ambulatory Visit: Payer: Self-pay

## 2020-02-19 DIAGNOSIS — Z13820 Encounter for screening for osteoporosis: Secondary | ICD-10-CM

## 2020-04-07 DIAGNOSIS — Z1231 Encounter for screening mammogram for malignant neoplasm of breast: Secondary | ICD-10-CM | POA: Diagnosis not present

## 2020-06-22 DIAGNOSIS — Z23 Encounter for immunization: Secondary | ICD-10-CM | POA: Diagnosis not present

## 2020-10-02 ENCOUNTER — Other Ambulatory Visit: Payer: Self-pay | Admitting: Gastroenterology

## 2020-10-19 ENCOUNTER — Ambulatory Visit (HOSPITAL_COMMUNITY)
Admission: EM | Admit: 2020-10-19 | Discharge: 2020-10-19 | Disposition: A | Discharge status: Home / Self Care | Payer: Medicare Other | Attending: Family Medicine | Admitting: Family Medicine

## 2020-10-19 ENCOUNTER — Encounter (HOSPITAL_COMMUNITY): Payer: Self-pay | Admitting: Emergency Medicine

## 2020-10-19 ENCOUNTER — Ambulatory Visit: Payer: Medicare Other | Admitting: Physician Assistant

## 2020-10-19 ENCOUNTER — Other Ambulatory Visit: Payer: Self-pay

## 2020-10-19 DIAGNOSIS — K219 Gastro-esophageal reflux disease without esophagitis: Secondary | ICD-10-CM

## 2020-10-19 DIAGNOSIS — R1013 Epigastric pain: Secondary | ICD-10-CM

## 2020-10-19 DIAGNOSIS — I10 Essential (primary) hypertension: Secondary | ICD-10-CM | POA: Diagnosis present

## 2020-10-19 LAB — POCT URINALYSIS DIPSTICK, ED / UC
Bilirubin Urine: NEGATIVE
Glucose, UA: NEGATIVE mg/dL
Hgb urine dipstick: NEGATIVE
Ketones, ur: NEGATIVE mg/dL
Nitrite: NEGATIVE
Protein, ur: NEGATIVE mg/dL
Specific Gravity, Urine: 1.02 (ref 1.005–1.030)
Urobilinogen, UA: 0.2 mg/dL (ref 0.0–1.0)
pH: 7 (ref 5.0–8.0)

## 2020-10-19 NOTE — ED Triage Notes (Signed)
Pt presents with abdominal pain, nervousness, and reflux xs 3 weeks on and off.

## 2020-10-19 NOTE — Discharge Instructions (Addendum)
You have been seen today for abdominal pain. Your evaluation was not suggestive of any emergent condition requiring medical intervention at this time. However, some abdominal problems make take more time to appear. Therefore, it is very important for you to pay attention to any new symptoms or worsening of your current condition.  Please return here or to the Emergency Department immediately should you begin to feel worse in any way or have any of the following symptoms: increasing or different abdominal pain, persistent vomiting, inability to drink fluids, fevers, or shaking chills.   Your blood pressure was noted to be elevated during your visit today. If you are currently taking medication for high blood pressure, please ensure you are taking this as directed. If you do not have a history of high blood pressure and your blood pressure remains persistently elevated, you may need to begin taking a medication at some point. You may return here within the next few days to recheck if unable to see your primary care provider or if you do not have a one.  BP (!) 158/85 (BP Location: Left Arm)    Pulse 62    Temp 97.9 F (36.6 C) (Oral)    Resp 18    SpO2 97%   BP Readings from Last 3 Encounters:  10/19/20 (!) 158/85  01/10/20 124/64  11/03/19 (!) 168/73

## 2020-10-19 NOTE — ED Provider Notes (Signed)
[facility]   [id number] 10/19/20 Arrival Time: 7035  ASSESSMENT & PLAN:  1. Epigastric pain   2. Gastroesophageal reflux disease without esophagitis   3. Elevated blood pressure reading in office with diagnosis of hypertension     Benign abdominal exam. No indications for urgent abdominal/pelvic imaging at this time. Symptoms likely related to GERD. Admits that she does not take her Nexium daily. Discussed.  Will begin daily Nexium and schedule PCP f/u. Also sees GI should she wish to f/u with them.   Discharge Instructions      You have been seen today for abdominal pain. Your evaluation was not suggestive of any emergent condition requiring medical intervention at this time. However, some abdominal problems make take more time to appear. Therefore, it is very important for you to pay attention to any new symptoms or worsening of your current condition.  Please return here or to the Emergency Department immediately should you begin to feel worse in any way or have any of the following symptoms: increasing or different abdominal pain, persistent vomiting, inability to drink fluids, fevers, or shaking chills.   Your blood pressure was noted to be elevated during your visit today. If you are currently taking medication for high blood pressure, please ensure you are taking this as directed. If you do not have a history of high blood pressure and your blood pressure remains persistently elevated, you may need to begin taking a medication at some point. You may return here within the next few days to recheck if unable to see your primary care provider or if you do not have a one.  BP (!) 158/85 (BP Location: Left Arm)   Pulse 62   Temp 97.9 F (36.6 C) (Oral)   Resp 18   SpO2 97%   BP Readings from Last 3 Encounters:  10/19/20 (!) 158/85  01/10/20 124/64  11/03/19 (!) 168/73      Follow-up Information    Schedule an appointment as soon as possible for a visit  with  Jasmine December, NP.   Contact information: [address] [place] [address] [place].   Specialty: Emergency Medicine Why: If symptoms worsen in any way. Contact information: [address] [phone] [place] [phone]              Urine culture sent given leuks on U/A here.  Reviewed expectations re: course of current medical issues. Questions answered. Outlined signs and symptoms indicating need for more acute intervention. Patient verbalized understanding. After Visit Summary given.   SUBJECTIVE: History from: patient. Jane [name] is a 65 y.o. female who presents with complaint of intermittent epigastric abdominal discomfort. Sporadic over past 3 weeks. H/O acid reflux with same symptoms. Has Nexium but does not take daily. No diet change. Wt is stable. No back pain. No urinay symptoms except for slight urinary freq sev w ago. No OTC tx. No new medications. Without associated CP/SOB.  No LMP recorded. Patient is postmenopausal.   Past Surgical History:  Procedure Laterality Date  . COLONOSCOPY    . TUBAL LIGATION     Increased blood pressure noted today. Reports that she is treated.  She reports no chest pain on exertion, no dyspnea on exertion and no intermittent claudication symptoms.   OBJECTIVE:  Vitals:   10/19/20 0957  BP: (!) 158/85  Pulse: 62  Resp: 18  Temp: 97.9 F (36.6  C)  TempSrc: Oral  SpO2: 97%    General appearance: alert, oriented, no acute distress HEENT: [place]; AT; oropharynx moist Lungs: unlabored respirations Abdomen: soft; without distention; no specific tenderness to palpation; normal bowel sounds; without masses or organomegaly; without guarding or rebound tenderness Back: without reported CVA tenderness; FROM at waist Extremities: without LE edema; symmetrical; without gross deformities Skin: warm and dry Neurologic: normal  gait Psychological: alert and cooperative; normal mood and affect  Labs: Results for orders placed or performed during the hospital encounter of 10/19/20  POC Urinalysis dipstick  Result Value Ref Range   Glucose, UA NEGATIVE NEGATIVE mg/dL   Bilirubin Urine NEGATIVE NEGATIVE   Ketones, ur NEGATIVE NEGATIVE mg/dL   Specific Gravity, Urine 1.020 1.005 - 1.030   Hgb urine dipstick NEGATIVE NEGATIVE   pH 7.0 5.0 - 8.0   Protein, ur NEGATIVE NEGATIVE mg/dL   Urobilinogen, UA 0.2 0.0 - 1.0 mg/dL   Nitrite NEGATIVE NEGATIVE   Leukocytes,Ua TRACE (A) NEGATIVE   Labs Reviewed  POCT URINALYSIS DIPSTICK, ED / UC - Abnormal; Notable for the following components:      Result Value   Leukocytes,Ua TRACE (*)    All other components within normal limits  URINE CULTURE      No Known Allergies                                             Past Medical History:  Diagnosis Date  . Acid reflux   . Arthritis   . Hypertension   . Reflux   . Vasculitis ([place])     Social History   Socioeconomic History  . Marital status: Divorced    Spouse name: Not on file  . Number of children: Not on file  . Years of education: Not on file  . Highest education level: Not on file  Occupational History  . Occupation: Chartered certified accountant  Tobacco Use  . Smoking status: Never Smoker  . Smokeless tobacco: Never Used  Vaping Use  . Vaping Use: Never used  Substance and Sexual Activity  . Alcohol use: No    Alcohol/week: 0.0 standard drinks  . Drug use: No  . Sexual activity: Not on file  Other Topics Concern  . Not on file  Social History Narrative  . Not on file   Social Determinants of Health   Financial Resource Strain: Not on file  Food Insecurity: Not on file  Transportation Needs: Not on file  Physical Activity: Not on file  Stress: Not on file  Social Connections: Not on file  Intimate Partner Violence: Not on file    Family History  Problem Relation Age of Onset  . Alcoholism Mother    . Lung disease Maternal Grandmother   . Diabetes Maternal Uncle   . Colon cancer Neg Hx   . Stomach cancer Neg Hx      Vanessa Kick, MD 10/19/20 1051

## 2020-10-21 ENCOUNTER — Telehealth (HOSPITAL_COMMUNITY): Payer: Self-pay | Admitting: Emergency Medicine

## 2020-10-21 LAB — URINE CULTURE: Culture: 40000 — AB

## 2020-10-21 MED ORDER — NITROFURANTOIN MONOHYD MACRO 100 MG PO CAPS: 100.0000 mg | ORAL_CAPSULE | Freq: Two times a day (BID) | ORAL | 0 refills | Status: DC

## 2020-10-25 ENCOUNTER — Encounter (HOSPITAL_COMMUNITY): Payer: Self-pay | Admitting: Pharmacy Technician

## 2020-10-25 ENCOUNTER — Other Ambulatory Visit: Payer: Self-pay

## 2020-10-25 ENCOUNTER — Emergency Department (HOSPITAL_COMMUNITY)
Admission: EM | Admit: 2020-10-25 | Discharge: 2020-10-25 | Disposition: A | Discharge status: Home / Self Care | Payer: Medicare Other | Attending: Emergency Medicine | Admitting: Emergency Medicine

## 2020-10-25 ENCOUNTER — Emergency Department (HOSPITAL_COMMUNITY): Payer: Medicare Other

## 2020-10-25 DIAGNOSIS — I10 Essential (primary) hypertension: Secondary | ICD-10-CM | POA: Insufficient documentation

## 2020-10-25 DIAGNOSIS — K449 Diaphragmatic hernia without obstruction or gangrene: Secondary | ICD-10-CM | POA: Diagnosis not present

## 2020-10-25 DIAGNOSIS — Z79899 Other long term (current) drug therapy: Secondary | ICD-10-CM | POA: Diagnosis not present

## 2020-10-25 DIAGNOSIS — K219 Gastro-esophageal reflux disease without esophagitis: Secondary | ICD-10-CM | POA: Insufficient documentation

## 2020-10-25 DIAGNOSIS — R109 Unspecified abdominal pain: Secondary | ICD-10-CM | POA: Diagnosis present

## 2020-10-25 DIAGNOSIS — R1013 Epigastric pain: Secondary | ICD-10-CM

## 2020-10-25 LAB — COMPREHENSIVE METABOLIC PANEL
ALT: 25 U/L (ref 0–44)
AST: 33 U/L (ref 15–41)
Albumin: 3.9 g/dL (ref 3.5–5.0)
Alkaline Phosphatase: 100 U/L (ref 38–126)
Anion gap: 7 (ref 5–15)
BUN: 16 mg/dL (ref 8–23)
CO2: 30 mmol/L (ref 22–32)
Calcium: 9.5 mg/dL (ref 8.9–10.3)
Chloride: 100 mmol/L (ref 98–111)
Creatinine, Ser: 0.88 mg/dL (ref 0.44–1.00)
GFR, Estimated: >60 mL/min (ref >60)
Glucose, Bld: 104 mg/dL — ABNORMAL HIGH (ref 70–99)
Potassium: 4 mmol/L (ref 3.5–5.1)
Sodium: 137 mmol/L (ref 135–145)
Total Bilirubin: 0.5 mg/dL (ref 0.3–1.2)
Total Protein: 7.7 g/dL (ref 6.5–8.1)

## 2020-10-25 LAB — URINALYSIS, ROUTINE W REFLEX MICROSCOPIC
Bilirubin Urine: NEGATIVE
Glucose, UA: NEGATIVE mg/dL
Hgb urine dipstick: NEGATIVE
Ketones, ur: NEGATIVE mg/dL
Nitrite: NEGATIVE
Protein, ur: NEGATIVE mg/dL
Specific Gravity, Urine: 1.008 (ref 1.005–1.030)
pH: 8 (ref 5.0–8.0)

## 2020-10-25 LAB — CBC
HCT: 40.4 % (ref 36.0–46.0)
Hemoglobin: 13.1 g/dL (ref 12.0–15.0)
MCH: 30.3 pg (ref 26.0–34.0)
MCHC: 32.4 g/dL (ref 30.0–36.0)
MCV: 93.5 fL (ref 80.0–100.0)
Platelets: 197 10*3/uL (ref 150–400)
RBC: 4.32 MIL/uL (ref 3.87–5.11)
RDW: 12.5 % (ref 11.5–15.5)
WBC: 6.7 10*3/uL (ref 4.0–10.5)
nRBC: 0 % (ref 0.0–0.2)

## 2020-10-25 LAB — LIPASE, BLOOD: Lipase: 31 U/L (ref 11–51)

## 2020-10-25 MED ORDER — IOHEXOL 300 MG/ML  SOLN
100.0000 mL | Freq: Once | INTRAMUSCULAR | Status: AC | PRN
  Administered 2020-10-25: 100 mL via INTRAVENOUS

## 2020-10-25 NOTE — ED Provider Notes (Signed)
[facility] EMERGENCY DEPARTMENT Provider Note   CSN: [id number] Arrival date & time: 10/25/20  0749     History Chief Complaint  Patient presents with  . Abdominal Pain    Jane [name] is a 65 y.o. female.  HPI Patient more she has been getting abdominal distention and bloating for a number of weeks or more.  She reports when she eats sometimes she feels extremely distended in the upper abdomen.  She has not had any vomiting after eating.  Sometimes she has nausea.  She reports she is also been experiencing some generalized weakness more recently.  She reports that she fatigues easily.  No known weight loss.  Patient denies constipation or diarrhea.  All movements have been fairly regular.  She has been taking esmolol due to history of acid reflux.  However, symptoms are persisting.  She has not seen any blood in her stool.  Patient does not drink alcohol.    Past Medical History:  Diagnosis Date  . Acid reflux   . Arthritis   . Hypertension   . Reflux   . Vasculitis [facility])     Patient Active Problem List   Diagnosis Date Noted  . Gastroesophageal reflux disease 01/29/2018  . Abdominal pain, epigastric 01/29/2018  . Plantar fasciitis, bilateral 02/29/2016  . Metatarsal deformity 02/29/2016  . Tenosynovitis of foot 02/29/2016    Past Surgical History:  Procedure Laterality Date  . COLONOSCOPY    . TUBAL LIGATION       OB History   No obstetric history on file.     Family History  Problem Relation Age of Onset  . Alcoholism Mother   . Lung disease Maternal Grandmother   . Diabetes Maternal Uncle   . Colon cancer Neg Hx   . Stomach cancer Neg Hx     Social History   Tobacco Use  . Smoking status: Never Smoker  . Smokeless tobacco: Never Used  Vaping Use  . Vaping Use: Never used  Substance Use Topics  . Alcohol use: No    Alcohol/week: 0.0 standard drinks  . Drug use: No    Home Medications Prior to Admission medications    Medication Sig Start Date End Date Taking? Authorizing Provider  alum & mag hydroxide-simeth (MAALOX/MYLANTA) 200-200-20 MG/5ML suspension Take 15 mLs by mouth every 6 (six) hours as needed for indigestion or heartburn. 12/21/18   Wieters, Hallie C, PA-C  B Complex Vitamins (VITAMIN-B COMPLEX) TABS Take 1 tablet by mouth daily.     [provider]  Cholecalciferol (D 1000) 1000 units capsule Take 1,000 Units by mouth daily.     [provider]  docusate sodium (COLACE) 100 MG capsule Take 1 capsule (100 mg total) by mouth every 12 (twelve) hours. 12/21/18   Wieters, Hallie C, PA-C  esomeprazole (NEXIUM) 20 MG capsule Nexium 20 mg capsule,delayed release  Take 1 capsule every day by oral route for 90 days.    [provider]  esomeprazole (NEXIUM) 40 MG capsule TAKE 1 CAPSULE BY MOUTH DAILY AT 12 NOON 10/03/20   Nandigam, Venia Minks, MD  hydrOXYzine (ATARAX/VISTARIL) 25 MG tablet Take 1 tablet (25 mg total) by mouth every 6 (six) hours. 11/29/17   Moss, Amber, DO  lisinopril-hydrochlorothiazide (ZESTORETIC) 20-12.5 MG tablet Take 1 tablet by mouth daily. 01/10/20   Raylene Everts, MD  MAGNESIUM-CALCIUM-FOLIC ACID PO Take by mouth.    [provider]  Multiple Vitamin (MULTIVITAMIN WITH MINERALS) TABS tablet Take 1 tablet  by mouth daily.    [provider]  naproxen (EC NAPROSYN) 500 MG EC tablet naproxen 500 mg tablet,delayed release  take one tablet one a day 06/25/18   Vanessa Kick, MD  nitrofurantoin, macrocrystal-monohydrate, (MACROBID) 100 MG capsule Take 1 capsule (100 mg total) by mouth 2 (two) times daily. 10/21/20   Lamptey, Myrene Galas, MD  polyethylene glycol (MIRALAX / GLYCOLAX) 17 g packet Take 17 g by mouth daily. 12/21/18   Wieters, Hallie C, PA-C    Allergies    Iodinated diagnostic agents  Review of Systems   Review of Systems 10 systems reviewed negative except as per HPI Physical Exam Updated Vital Signs BP (!) 157/100 Comment: pt  reports she has a white coat syndrome  Pulse 60   Temp 99.1 F (37.3 C) (Oral)   Resp 18   Ht 5\' 5"  (1.651 m)   Wt 99.8 kg   SpO2 100%   BMI 36.61 kg/m   Physical Exam Constitutional:      Appearance: Normal appearance. She is well-developed and well-nourished.  HENT:     Head: Normocephalic and atraumatic.     Mouth/Throat:     Mouth: Mucous membranes are moist.     Pharynx: Oropharynx is clear.  Eyes:     Extraocular Movements: Extraocular movements intact and EOM normal.     Conjunctiva/sclera: Conjunctivae normal.     Pupils: Pupils are equal, round, and reactive to light.  Cardiovascular:     Rate and Rhythm: Normal rate and regular rhythm.     Pulses: Intact distal pulses.     Heart sounds: Normal heart sounds.  Pulmonary:     Effort: Pulmonary effort is normal.     Breath sounds: Normal breath sounds.  Abdominal:     General: Bowel sounds are normal. There is no distension.     Palpations: Abdomen is soft.     Comments: Moderate epigastric tenderness to palpation.  Musculoskeletal:        General: No edema. Normal range of motion.     Cervical back: Neck supple.     Right lower leg: No edema.     Left lower leg: No edema.  Skin:    General: Skin is warm, dry and intact.  Neurological:     Mental Status: She is alert and oriented to person, place, and time.     GCS: GCS eye subscore is 4. GCS verbal subscore is 5. GCS motor subscore is 6.     Coordination: Coordination normal.     Deep Tendon Reflexes: Strength normal.  Psychiatric:        Mood and Affect: Mood and affect and mood normal.     ED Results / Procedures / Treatments   Labs (all labs ordered are listed, but only abnormal results are displayed) Labs Reviewed  COMPREHENSIVE METABOLIC PANEL - Abnormal; Notable for the following components:      Result Value   Glucose, Bld 104 (*)    All other components within normal limits  URINALYSIS, ROUTINE W REFLEX MICROSCOPIC - Abnormal; Notable for the  following components:   Color, Urine STRAW (*)    Leukocytes,Ua TRACE (*)    Bacteria, UA RARE (*)    All other components within normal limits  LIPASE, BLOOD  CBC    EKG None  Radiology CT Abdomen Pelvis W Contrast  Result Date: 10/25/2020 CLINICAL DATA:  Abdominal distention for the last 2 weeks, with nausea EXAM: CT ABDOMEN AND PELVIS WITH CONTRAST TECHNIQUE:  Multidetector CT imaging of the abdomen and pelvis was performed using the standard protocol following bolus administration of intravenous contrast. CONTRAST:  141mL OMNIPAQUE IOHEXOL 300 MG/ML  SOLN COMPARISON:  None. FINDINGS: Lower chest: No acute abnormality.  Normal size heart. Hepatobiliary: No suspicious hepatic lesion. Gallbladder is unremarkable. No biliary ductal dilation. Pancreas: Unremarkable Spleen: Unremarkable Adrenals/Urinary Tract: Bilateral adrenal glands are unremarkable. No hydronephrosis.  No suspicious renal masses. No filling defect visualized within the opacified portions of the collecting system or ureters on delayed imaging. Urinary bladder is unremarkable for degree of distension. Stomach/Bowel: Small hiatal hernia otherwise the stomach is unremarkable. Normal positioning of the duodenum/ligament of Treitz. No suspicious small bowel wall thickening or dilation. Terminal ileum is unremarkable. 7 mm appendicoliths within an otherwise normal appendix. Descending and sigmoid colonic diverticulosis without findings of acute diverticulitis. Vascular/Lymphatic: Aortic atherosclerosis. No enlarged abdominal or pelvic lymph nodes. Reproductive: Uterus and bilateral adnexa are unremarkable. Other: No abdominopelvic ascites. Calcification in the right posterior subcutaneous soft tissue, likely sequela of prior injection versus trauma. Musculoskeletal: Multilevel degenerative changes spine. No acute osseous abnormality. IMPRESSION: 1. No acute abdominopelvic findings. 2. Descending and sigmoid colonic diverticulosis without  findings of acute diverticulitis. 3. Appendicolith measuring 7 mm within an otherwise normal appendix. 4. Small hiatal hernia. 5. Aortic atherosclerosis. Aortic Atherosclerosis (ICD10-I70.0). Electronically Signed   By: Dahlia Bailiff MD   On: 10/25/2020 10:44    Procedures Procedures   Medications Ordered in ED Medications  iohexol (OMNIPAQUE) 300 MG/ML solution 100 mL (100 mLs Intravenous Contrast Given 10/25/20 1025)    ED Course  I have reviewed the triage vital signs and the nursing notes.  Pertinent labs & imaging results that were available during my care of the patient were reviewed by me and considered in my medical decision making (see chart for details).    MDM Rules/Calculators/A&P                          Patient has been experiencing increasing epigastric pain and distention.  CT does not show acute findings.  Patient has small hiatal hernia.  Labs otherwise normal.  We reviewed management for GERD, hiatal hernia.  We reviewed avoiding all NSAID medications.  Plan will be for follow-up with PCP to discuss scheduling upper endoscopy. Final Clinical Impression(s) / ED Diagnoses Final diagnoses:  Epigastric pain  Gastroesophageal reflux disease without esophagitis  Hiatal hernia    Rx / DC Orders ED Discharge Orders    None       Charlesetta Shanks, MD 10/25/20 1127

## 2020-10-25 NOTE — ED Triage Notes (Signed)
Pt here with reports of abdominal pain and fatigue for a few weeks. Pt states seen at [facility] last. Pt started on abx but states has not had any relief.

## 2020-10-25 NOTE — ED Notes (Signed)
Patient Alert and oriented to baseline. Stable and ambulatory to baseline. Patient verbalized understanding of the discharge instructions.  Patient belongings were taken by the patient.   

## 2020-10-25 NOTE — Discharge Instructions (Signed)
1.  Take your Nexium every day as prescribed.  Take it 30 minutes before you eat breakfast.  Follow dietary instructions for reflux disease. 2.  Schedule a follow-up appointment with your doctor to discuss referral for an upper endoscopy. 3.  Return to the emergency department if you are having new, worsening or other concerning symptoms.

## 2021-01-19 ENCOUNTER — Ambulatory Visit: Payer: Medicare Other | Admitting: Gastroenterology

## 2021-03-11 ENCOUNTER — Ambulatory Visit (HOSPITAL_COMMUNITY)
Admission: EM | Admit: 2021-03-11 | Discharge: 2021-03-11 | Disposition: A | Discharge status: Home / Self Care | Payer: Medicare Other | Attending: Urgent Care | Admitting: Urgent Care

## 2021-03-11 ENCOUNTER — Encounter (HOSPITAL_COMMUNITY): Payer: Self-pay

## 2021-03-11 DIAGNOSIS — K0889 Other specified disorders of teeth and supporting structures: Secondary | ICD-10-CM

## 2021-03-11 DIAGNOSIS — K0510 Chronic gingivitis, plaque induced: Secondary | ICD-10-CM

## 2021-03-11 MED ORDER — CHLORHEXIDINE GLUCONATE 0.12 % MT SOLN: OROMUCOSAL | 0 refills | Status: DC

## 2021-03-11 NOTE — ED Triage Notes (Signed)
Pt c/o possible dental abscess to lower right side of mouth. Has appointment at dentist next month. Started: about a week ago Interventions: tylenol- somewhat helpful

## 2021-03-11 NOTE — ED Provider Notes (Signed)
[facility]   MRN: [id number] DOB: Feb 14, 1956  Subjective:   Jane [name] is a 65 y.o. female presenting for concern for abscess of the lower right side of her mouth.  Patient recently had some molars extracted.  Has since had intermittent pain and swelling of the gums.  Denies fever, nausea, vomiting, facial pain, facial swelling.  Has not check back with her dentist.  She has used some Tylenol with some relief.   Current Facility-Administered Medications:    0.9 %  sodium chloride infusion, 500 mL, Intravenous, Once, Nandigam, Kavitha V, MD  Current Outpatient Medications:    alum & mag hydroxide-simeth (MAALOX/MYLANTA) 200-200-20 MG/5ML suspension, Take 15 mLs by mouth every 6 (six) hours as needed for indigestion or heartburn., Disp: 355 mL, Rfl: 0   B Complex Vitamins (VITAMIN-B COMPLEX) TABS, Take 1 tablet by mouth daily. , Disp: , Rfl:    Cholecalciferol (D 1000) 1000 units capsule, Take 1,000 Units by mouth daily. , Disp: , Rfl:    docusate sodium (COLACE) 100 MG capsule, Take 1 capsule (100 mg total) by mouth every 12 (twelve) hours., Disp: 60 capsule, Rfl: 0   esomeprazole (NEXIUM) 20 MG capsule, Nexium 20 mg capsule,delayed release  Take 1 capsule every day by oral route for 90 days., Disp: , Rfl:    esomeprazole (NEXIUM) 40 MG capsule, TAKE 1 CAPSULE BY MOUTH DAILY AT 12 NOON, Disp: 30 capsule, Rfl: 11   hydrOXYzine (ATARAX/VISTARIL) 25 MG tablet, Take 1 tablet (25 mg total) by mouth every 6 (six) hours., Disp: 12 tablet, Rfl: 0   lisinopril-hydrochlorothiazide (ZESTORETIC) 20-12.5 MG tablet, Take 1 tablet by mouth daily., Disp: 90 tablet, Rfl: 0   MAGNESIUM-CALCIUM-FOLIC ACID PO, Take by mouth., Disp: , Rfl:    Multiple Vitamin (MULTIVITAMIN WITH MINERALS) TABS tablet, Take 1 tablet by mouth daily., Disp: , Rfl:    naproxen (EC NAPROSYN) 500 MG EC tablet, naproxen 500 mg tablet,delayed release  take one tablet one a day, Disp: 30 tablet, Rfl: 0    nitrofurantoin, macrocrystal-monohydrate, (MACROBID) 100 MG capsule, Take 1 capsule (100 mg total) by mouth 2 (two) times daily., Disp: 10 capsule, Rfl: 0   polyethylene glycol (MIRALAX / GLYCOLAX) 17 g packet, Take 17 g by mouth daily., Disp: 14 each, Rfl: 0   Allergies  Allergen Reactions   Iodinated Diagnostic Agents Itching    Pt developed facial itching after contrast administration.    Past Medical History:  Diagnosis Date   Acid reflux    Arthritis    Hypertension    Reflux    Vasculitis (HCC)      Past Surgical History:  Procedure Laterality Date   COLONOSCOPY     TUBAL LIGATION      Family History  Problem Relation Age of Onset   Alcoholism Mother    Lung disease Maternal Grandmother    Diabetes Maternal Uncle    Colon cancer Neg Hx    Stomach cancer Neg Hx     Social History   Tobacco Use   Smoking status: Never   Smokeless tobacco: Never  Vaping Use   Vaping Use: Never used  Substance Use Topics   Alcohol use: No    Alcohol/week: 0.0 standard drinks   Drug use: No    ROS   Objective:   Vitals: BP (!) 160/85 (BP Location: Left Arm)   Pulse 64   Temp 98 F (36.7 C) (Oral)   Resp 18   SpO2 99%  Physical Exam Constitutional:      General: She is not in acute distress.    Appearance: Normal appearance. She is well-developed. She is not ill-appearing.  HENT:     Head: Normocephalic and atraumatic.     Nose: Nose normal.     Mouth/Throat:     Mouth: Mucous membranes are moist.     Pharynx: Oropharynx is clear.   Eyes:     General: No scleral icterus.    Extraocular Movements: Extraocular movements intact.     Pupils: Pupils are equal, round, and reactive to light.  Cardiovascular:     Rate and Rhythm: Normal rate.  Pulmonary:     Effort: Pulmonary effort is normal.  Skin:    General: Skin is warm and dry.  Neurological:     General: No focal deficit present.     Mental Status: She is alert and oriented to person, place, and time.   Psychiatric:        Mood and Affect: Mood normal.        Behavior: Behavior normal.    Assessment and Plan :   PDMP not reviewed this encounter.  1. Gingivitis   2. Pain, dental     Low suspicion for dental abscess, recommended conservative management with supportive care, chlorhexidine rinse.  Follow-up with very closely with dentist. Counseled patient on potential for adverse effects with medications prescribed/recommended today, ER and return-to-clinic precautions discussed, patient verbalized understanding.    Jaynee Eagles, [place] 03/12/21 [phone]

## 2021-04-25 ENCOUNTER — Emergency Department (HOSPITAL_COMMUNITY): Payer: Medicare Other

## 2021-04-25 ENCOUNTER — Emergency Department (HOSPITAL_COMMUNITY)
Admission: EM | Admit: 2021-04-25 | Discharge: 2021-04-25 | Disposition: A | Discharge status: Home / Self Care | Payer: Medicare Other | Attending: Student | Admitting: Student

## 2021-04-25 ENCOUNTER — Encounter (HOSPITAL_COMMUNITY): Payer: Self-pay | Admitting: Emergency Medicine

## 2021-04-25 DIAGNOSIS — K219 Gastro-esophageal reflux disease without esophagitis: Secondary | ICD-10-CM | POA: Insufficient documentation

## 2021-04-25 DIAGNOSIS — Z79899 Other long term (current) drug therapy: Secondary | ICD-10-CM | POA: Diagnosis not present

## 2021-04-25 DIAGNOSIS — I10 Essential (primary) hypertension: Secondary | ICD-10-CM | POA: Diagnosis not present

## 2021-04-25 DIAGNOSIS — R1032 Left lower quadrant pain: Secondary | ICD-10-CM | POA: Diagnosis present

## 2021-04-25 DIAGNOSIS — K5792 Diverticulitis of intestine, part unspecified, without perforation or abscess without bleeding: Secondary | ICD-10-CM | POA: Diagnosis not present

## 2021-04-25 LAB — URINALYSIS, ROUTINE W REFLEX MICROSCOPIC
Bilirubin Urine: NEGATIVE
Glucose, UA: NEGATIVE mg/dL
Hgb urine dipstick: NEGATIVE
Ketones, ur: NEGATIVE mg/dL
Nitrite: NEGATIVE
Protein, ur: NEGATIVE mg/dL
Specific Gravity, Urine: 1.01 (ref 1.005–1.030)
pH: 7.5 (ref 5.0–8.0)

## 2021-04-25 LAB — CBC
HCT: 40.5 % (ref 36.0–46.0)
Hemoglobin: 13.1 g/dL (ref 12.0–15.0)
MCH: 30.2 pg (ref 26.0–34.0)
MCHC: 32.3 g/dL (ref 30.0–36.0)
MCV: 93.3 fL (ref 80.0–100.0)
Platelets: 222 10*3/uL (ref 150–400)
RBC: 4.34 MIL/uL (ref 3.87–5.11)
RDW: 12.4 % (ref 11.5–15.5)
WBC: 10.2 10*3/uL (ref 4.0–10.5)
nRBC: 0 % (ref 0.0–0.2)

## 2021-04-25 LAB — COMPREHENSIVE METABOLIC PANEL
ALT: 21 U/L (ref 0–44)
AST: 26 U/L (ref 15–41)
Albumin: 3.9 g/dL (ref 3.5–5.0)
Alkaline Phosphatase: 103 U/L (ref 38–126)
Anion gap: 7 (ref 5–15)
BUN: 11 mg/dL (ref 8–23)
CO2: 28 mmol/L (ref 22–32)
Calcium: 9.6 mg/dL (ref 8.9–10.3)
Chloride: 102 mmol/L (ref 98–111)
Creatinine, Ser: 0.81 mg/dL (ref 0.44–1.00)
GFR, Estimated: >60 mL/min (ref >60)
Glucose, Bld: 101 mg/dL — ABNORMAL HIGH (ref 70–99)
Potassium: 4.2 mmol/L (ref 3.5–5.1)
Sodium: 137 mmol/L (ref 135–145)
Total Bilirubin: 0.7 mg/dL (ref 0.3–1.2)
Total Protein: 7.7 g/dL (ref 6.5–8.1)

## 2021-04-25 LAB — LIPASE, BLOOD: Lipase: 25 U/L (ref 11–51)

## 2021-04-25 LAB — URINALYSIS, MICROSCOPIC (REFLEX)
Bacteria, UA: NONE SEEN
RBC / HPF: NONE SEEN RBC/hpf (ref 0–5)

## 2021-04-25 MED ORDER — AMOXICILLIN-POT CLAVULANATE 875-125 MG PO TABS: 1.0000 | ORAL_TABLET | Freq: Two times a day (BID) | ORAL | 0 refills | Status: DC

## 2021-04-25 MED ORDER — AMOXICILLIN-POT CLAVULANATE 875-125 MG PO TABS
1.0000 | ORAL_TABLET | Freq: Once | ORAL | Status: AC
  Administered 2021-04-25: 1 via ORAL
  Filled 2021-04-25: qty 1

## 2021-04-25 NOTE — ED Provider Notes (Signed)
[facility] EMERGENCY DEPARTMENT Provider Note   CSN: [id number] Arrival date & time: 04/25/21  0932     History Chief Complaint  Patient presents with   Abdominal Pain    Jane [name] is a 65 y.o. female with PMH HTN, acid reflux who presents the emergency department for evaluation of abdominal pain.  She states that her pain has been present for approximately 10 days and has been gradually getting worse.  She states that she has left lower quadrant abdominal pain that is worsened with defecation.  Worsened with eating.  She states that Tylenol helps her pain minimally.  Denies chest pain, shortness of breath, headache, fever, cough or other systemic symptoms.   Abdominal Pain Associated symptoms: no chest pain, no chills, no cough, no dysuria, no fever, no hematuria, no shortness of breath, no sore throat and no vomiting       Past Medical History:  Diagnosis Date   Acid reflux    Arthritis    Hypertension    Reflux    Vasculitis ([place])     Patient Active Problem List   Diagnosis Date Noted   Gastroesophageal reflux disease 01/29/2018   Abdominal pain, epigastric 01/29/2018   Plantar fasciitis, bilateral 02/29/2016   Metatarsal deformity 02/29/2016   Tenosynovitis of foot 02/29/2016    Past Surgical History:  Procedure Laterality Date   COLONOSCOPY     TUBAL LIGATION       OB History   No obstetric history on file.     Family History  Problem Relation Age of Onset   Alcoholism Mother    Lung disease Maternal Grandmother    Diabetes Maternal Uncle    Colon cancer Neg Hx    Stomach cancer Neg Hx     Social History   Tobacco Use   Smoking status: Never   Smokeless tobacco: Never  Vaping Use   Vaping Use: Never used  Substance Use Topics   Alcohol use: No    Alcohol/week: 0.0 standard drinks   Drug use: No    Home Medications Prior to Admission medications   Medication Sig Start Date End Date Taking? Authorizing Provider   acetaminophen (TYLENOL) 500 MG tablet Take 1,000 mg by mouth every 6 (six) hours as needed for mild pain.   Yes [provider]  atorvastatin (LIPITOR) 20 MG tablet Take 20 mg by mouth every evening.   Yes [provider]  B Complex Vitamins (VITAMIN-B COMPLEX) TABS Take 1 tablet by mouth daily.    Yes [provider]  chlorhexidine (PERIDEX) 0.12 % solution Rinse mouth with 59m for 30 seconds twice daily. Patient taking differently: Use as directed 15 mLs in the mouth or throat 2 (two) times daily. 03/11/21  Yes MJaynee Eagles PA-C  Cholecalciferol 25 MCG (1000 UT) capsule Take 1,000 Units by mouth daily.    Yes [provider]  esomeprazole (NEXIUM) 40 MG capsule TAKE 1 CAPSULE BY MOUTH DAILY AT 12 NOON Patient taking differently: Take 40 mg by mouth daily. 10/03/20  Yes Nandigam, KVenia Minks MD  ibuprofen (ADVIL) 200 MG tablet Take 400 mg by mouth every 6 (six) hours as needed for mild pain.   Yes [provider]  lisinopril-hydrochlorothiazide (ZESTORETIC) 20-12.5 MG tablet Take 1 tablet by mouth daily. 01/10/20  Yes NRaylene Everts MD  MAGNESIUM-CALCIUM-FOLIC ACID PO Take 1 tablet by mouth daily.   Yes [provider]    Allergies    Iodinated diagnostic agents  Review  of Systems   Review of Systems  Constitutional:  Negative for chills and fever.  HENT:  Negative for ear pain and sore throat.   Eyes:  Negative for pain and visual disturbance.  Respiratory:  Negative for cough and shortness of breath.   Cardiovascular:  Negative for chest pain and palpitations.  Gastrointestinal:  Positive for abdominal pain. Negative for vomiting.  Genitourinary:  Negative for dysuria and hematuria.  Musculoskeletal:  Negative for arthralgias and back pain.  Skin:  Negative for color change and rash.  Neurological:  Negative for seizures and syncope.  All other systems reviewed and are negative.  Physical Exam Updated Vital Signs BP (!)  155/75   Pulse 66   Temp 98.7 F (37.1 C) (Oral)   Resp 15   SpO2 100%   Physical Exam Vitals and nursing note reviewed.  Constitutional:      General: She is not in acute distress.    Appearance: She is well-developed.  HENT:     Head: Normocephalic and atraumatic.  Eyes:     Conjunctiva/sclera: Conjunctivae normal.  Cardiovascular:     Rate and Rhythm: Normal rate and regular rhythm.     Heart sounds: No murmur heard. Pulmonary:     Effort: Pulmonary effort is normal. No respiratory distress.     Breath sounds: Normal breath sounds.  Abdominal:     Palpations: Abdomen is soft.     Tenderness: There is abdominal tenderness in the left lower quadrant.  Musculoskeletal:     Cervical back: Neck supple.  Skin:    General: Skin is warm and dry.  Neurological:     Mental Status: She is alert.    ED Results / Procedures / Treatments   Labs (all labs ordered are listed, but only abnormal results are displayed) Labs Reviewed  COMPREHENSIVE METABOLIC PANEL - Abnormal; Notable for the following components:      Result Value   Glucose, Bld 101 (*)    All other components within normal limits  URINALYSIS, ROUTINE W REFLEX MICROSCOPIC - Abnormal; Notable for the following components:   Leukocytes,Ua MODERATE (*)    All other components within normal limits  LIPASE, BLOOD  CBC  URINALYSIS, MICROSCOPIC (REFLEX)    EKG None  Radiology CT ABDOMEN PELVIS WO CONTRAST  Result Date: 04/25/2021 CLINICAL DATA:  Lower abdominal pain, concern for diverticulitis EXAM: CT ABDOMEN AND PELVIS WITHOUT CONTRAST TECHNIQUE: Multidetector CT imaging of the abdomen and pelvis was performed following the standard protocol without IV contrast. COMPARISON:  CT abdomen/pelvis 10/25/2020 FINDINGS: Lower chest: The lung bases are clear. The imaged heart is unremarkable. Hepatobiliary: The liver and gallbladder are unremarkable. There is no biliary ductal dilatation. Pancreas: Unremarkable. Spleen:  Unremarkable. Adrenals/Urinary Tract: The adrenals are unremarkable. The kidneys are unremarkable, with no focal lesion, stone, hydronephrosis, or hydroureter. Stomach/Bowel: There is a small hiatal hernia. The stomach is otherwise unremarkable. There is no evidence of bowel obstruction. A 7 mm appendicolith is again seen in the appendix. There is no evidence of acute appendicitis. There is mild inflammatory change surrounding the distal descending colon consistent with acute diverticulitis in the setting of diverticulosis. Vascular/Lymphatic: There is minimal calcified atherosclerotic plaque in the abdominal aorta and right iliac vasculature. There is no aneurysm. There is no abdominal or pelvic lymphadenopathy. Reproductive: The uterus and adnexa are unremarkable. Other: There is no ascites or free air. Musculoskeletal: There is no acute osseous abnormality or aggressive osseous lesion. IMPRESSION: 1. Acute uncomplicated diverticulitis of the descending  colon. 2. Unchanged 7 mm appendicolith without evidence of acute appendicitis. 3.  Aortic Atherosclerosis (ICD10-I70.0). Electronically Signed   By: Valetta Mole M.D.   On: 04/25/2021 13:40    Procedures Procedures   Medications Ordered in ED Medications - No data to display  ED Course  I have reviewed the triage vital signs and the nursing notes.  Pertinent labs & imaging results that were available during my care of the patient were reviewed by me and considered in my medical decision making (see chart for details).    MDM Rules/Calculators/A&P                           Patient seen the emergency department for evaluation of abdominal pain.  Physical exam reveals left lower quadrant tenderness but is otherwise unremarkable.  Laboratory evaluation largely unremarkable.  CT abdomen pelvis without contrast showing evidence of acute diverticulitis.  Patient given first dose of Augmentin here in the emergency department and discharged with a 7-day  course of Augmentin and PCP follow-up.  She was given return precautions of which she voiced understanding and was discharged. Final Clinical Impression(s) / ED Diagnoses Final diagnoses:  None    Rx / DC Orders ED Discharge Orders     None        Shea Kapur, MD 04/25/21 1425

## 2021-04-25 NOTE — ED Triage Notes (Signed)
Patient complains of abdominal pain that started last Friday, reports pain feel likes gas and is improved by eating. Denies diarrhea, denies vomiting.

## 2021-06-15 ENCOUNTER — Ambulatory Visit: Payer: Medicare Other | Admitting: Nurse Practitioner

## 2021-07-02 ENCOUNTER — Encounter (HOSPITAL_COMMUNITY): Payer: Self-pay | Admitting: Emergency Medicine

## 2021-07-02 ENCOUNTER — Other Ambulatory Visit: Payer: Self-pay

## 2021-07-02 ENCOUNTER — Ambulatory Visit (HOSPITAL_COMMUNITY)
Admission: EM | Admit: 2021-07-02 | Discharge: 2021-07-02 | Disposition: A | Discharge status: Home / Self Care | Payer: Medicare Other | Attending: Family Medicine | Admitting: Family Medicine

## 2021-07-02 DIAGNOSIS — R109 Unspecified abdominal pain: Secondary | ICD-10-CM

## 2021-07-02 DIAGNOSIS — R140 Abdominal distension (gaseous): Secondary | ICD-10-CM

## 2021-07-02 MED ORDER — DICYCLOMINE HCL 20 MG PO TABS: 20.0000 mg | ORAL_TABLET | Freq: Two times a day (BID) | ORAL | 0 refills | Status: DC | PRN

## 2021-07-02 NOTE — ED Triage Notes (Signed)
Pt presents with abdominal pian, bloating, and fatigue xs 3 days.

## 2021-07-02 NOTE — ED Provider Notes (Signed)
[facility]    CSN: [id number] Arrival date & time: 07/02/21  1637      History   Chief Complaint Chief Complaint  Patient presents with   Abdominal Pain   Fatigue    HPI Jane [name] is a 65 y.o. female.   HPI Patient presents today for evaluation of generalized abdominal bloating, abdominal cramping, and fatigue.  Patient was seen at [facility] emergency department approximately 2 months ago diagnosed with diverticulitis.  Patient also has a history of GERD. She reports current symptoms are not similar to her bout with diverticulitis although she just feels overly bloated.  She endorses changing her diet however 2 days ago eating ribs and abruptly afterwards developed pain bloating and cramping of the abdomen along with some fatigue. She denies any nausea or vomiting.  She has had normal bowel movements over the last 3 days and she takes a stool softener chronically as she does have intermittent periods of constipation.  Endorses the need to drink more fluids.  She is tolerating solid foods.  Past Medical History:  Diagnosis Date   Acid reflux    Arthritis    Hypertension    Reflux    Vasculitis [facility])     Patient Active Problem List   Diagnosis Date Noted   Gastroesophageal reflux disease 01/29/2018   Abdominal pain, epigastric 01/29/2018   Plantar fasciitis, bilateral 02/29/2016   Metatarsal deformity 02/29/2016   Tenosynovitis of foot 02/29/2016    Past Surgical History:  Procedure Laterality Date   COLONOSCOPY     TUBAL LIGATION      OB History   No obstetric history on file.      Home Medications    Prior to Admission medications   Medication Sig Start Date End Date Taking? Authorizing Provider  dicyclomine (BENTYL) 20 MG tablet Take 1 tablet (20 mg total) by mouth 2 (two) times daily as needed for spasms. 07/02/21  Yes Scot Jun, FNP  acetaminophen (TYLENOL) 500 MG tablet Take 1,000 mg by mouth every 6 (six) hours as needed for  mild pain.    [provider]  amoxicillin-clavulanate (AUGMENTIN) 875-125 MG tablet Take 1 tablet by mouth every 12 (twelve) hours. 04/25/21   Kommor, Madison, MD  atorvastatin (LIPITOR) 20 MG tablet Take 20 mg by mouth every evening.    [provider]  B Complex Vitamins (VITAMIN-B COMPLEX) TABS Take 1 tablet by mouth daily.     [provider]  chlorhexidine (PERIDEX) 0.12 % solution Rinse mouth with 34mL for 30 seconds twice daily. Patient taking differently: Use as directed 15 mLs in the mouth or throat 2 (two) times daily. 03/11/21   Jaynee Eagles, PA-C  Cholecalciferol 25 MCG (1000 UT) capsule Take 1,000 Units by mouth daily.     [provider]  esomeprazole (NEXIUM) 40 MG capsule TAKE 1 CAPSULE BY MOUTH DAILY AT 12 NOON Patient taking differently: Take 40 mg by mouth daily. 10/03/20   Mauri Pole, MD  ibuprofen (ADVIL) 200 MG tablet Take 400 mg by mouth every 6 (six) hours as needed for mild pain.    [provider]  lisinopril-hydrochlorothiazide (ZESTORETIC) 20-12.5 MG tablet Take 1 tablet by mouth daily. 01/10/20   Raylene Everts, MD  MAGNESIUM-CALCIUM-FOLIC ACID PO Take 1 tablet by mouth daily.    [provider]    Family History Family History  Problem Relation Age of Onset   Alcoholism Mother    Lung disease Maternal Grandmother  Diabetes Maternal Uncle    Colon cancer Neg Hx    Stomach cancer Neg Hx     Social History Social History   Tobacco Use   Smoking status: Never   Smokeless tobacco: Never  Vaping Use   Vaping Use: Never used  Substance Use Topics   Alcohol use: No    Alcohol/week: 0.0 standard drinks   Drug use: No     Allergies   Iodinated diagnostic agents   Review of Systems Review of Systems Pertinent negatives listed in HPI   Physical Exam Triage Vital Signs ED Triage Vitals  Enc Vitals Group     BP 07/02/21 1756 (!) 141/75     Pulse Rate 07/02/21 1756 62     Resp 07/02/21  1756 16     Temp 07/02/21 1756 98.4 F (36.9 C)     Temp Source 07/02/21 1756 Oral     SpO2 07/02/21 1756 100 %     Weight --      Height --      Head Circumference --      Peak Flow --      Pain Score 07/02/21 1754 3     Pain Loc --      Pain Edu? --      Excl. in [place]? --    No data found.  Updated Vital Signs BP (!) 141/75 (BP Location: Left Arm)   Pulse 62   Temp 98.4 F (36.9 C) (Oral)   Resp 16   SpO2 100%   Visual Acuity Right Eye Distance:   Left Eye Distance:   Bilateral Distance:    Right Eye Near:   Left Eye Near:    Bilateral Near:     Physical Exam Constitutional:      Appearance: She is well-developed.  HENT:     Head: Normocephalic.  Eyes:     Extraocular Movements: Extraocular movements intact.     Pupils: Pupils are equal, round, and reactive to light.  Cardiovascular:     Rate and Rhythm: Normal rate and regular rhythm.  Pulmonary:     Effort: Pulmonary effort is normal.     Breath sounds: Normal breath sounds.  Abdominal:     General: Bowel sounds are normal.     Palpations: Abdomen is soft.     Tenderness: There is abdominal tenderness in the right lower quadrant.  Skin:    General: Skin is warm.     Capillary Refill: Capillary refill takes less than 2 seconds.  Neurological:     General: No focal deficit present.     Mental Status: She is alert. She is disoriented.  Psychiatric:        Mood and Affect: Mood normal.        Behavior: Behavior normal.     UC Treatments / Results  Labs (all labs ordered are listed, but only abnormal results are displayed) Labs Reviewed - No data to display  EKG   Radiology No results found.  Procedures Procedures (including critical care time)  Medications Ordered in UC Medications - No data to display  Initial Impression / Assessment and Plan / UC Course  I have reviewed the triage vital signs and the nursing notes.  Pertinent labs & imaging results that were available during my care of  the patient were reviewed by me and considered in my medical decision making (see chart for details).    Abdominal exam reassuring though concern for an acute abdomen.  Patient having  systems which are suspicious for that of irritable bowel syndrome.  Encouraged to continue to monitor foods that can exacerbate diverticular disease.  Start dicyclomine 20 mg twice daily as needed before meals.  Follow-up with PCP as needed.  Return as needed.  ER if any red flag symptoms develop Final Clinical Impressions(s) / UC Diagnoses   Final diagnoses:  Abdominal bloating with cramps   Discharge Instructions   None    ED Prescriptions     Medication Sig Dispense Auth. Provider   dicyclomine (BENTYL) 20 MG tablet Take 1 tablet (20 mg total) by mouth 2 (two) times daily as needed for spasms. 20 tablet Scot Jun, FNP      PDMP not reviewed this encounter.   Scot Jun, FNP 07/05/21 1425

## 2021-07-22 ENCOUNTER — Encounter (HOSPITAL_COMMUNITY): Payer: Self-pay | Admitting: Emergency Medicine

## 2021-07-22 ENCOUNTER — Emergency Department (HOSPITAL_COMMUNITY): Payer: Medicare Other

## 2021-07-22 ENCOUNTER — Other Ambulatory Visit: Payer: Self-pay

## 2021-07-22 ENCOUNTER — Emergency Department (HOSPITAL_COMMUNITY)
Admission: EM | Admit: 2021-07-22 | Discharge: 2021-07-22 | Disposition: A | Discharge status: Home / Self Care | Payer: Medicare Other | Attending: Emergency Medicine | Admitting: Emergency Medicine

## 2021-07-22 DIAGNOSIS — Z20822 Contact with and (suspected) exposure to covid-19: Secondary | ICD-10-CM | POA: Insufficient documentation

## 2021-07-22 DIAGNOSIS — I10 Essential (primary) hypertension: Secondary | ICD-10-CM | POA: Diagnosis not present

## 2021-07-22 DIAGNOSIS — H9201 Otalgia, right ear: Secondary | ICD-10-CM | POA: Diagnosis not present

## 2021-07-22 DIAGNOSIS — R0602 Shortness of breath: Secondary | ICD-10-CM | POA: Insufficient documentation

## 2021-07-22 DIAGNOSIS — J069 Acute upper respiratory infection, unspecified: Secondary | ICD-10-CM | POA: Diagnosis not present

## 2021-07-22 DIAGNOSIS — Z79899 Other long term (current) drug therapy: Secondary | ICD-10-CM | POA: Diagnosis not present

## 2021-07-22 DIAGNOSIS — R059 Cough, unspecified: Secondary | ICD-10-CM | POA: Diagnosis present

## 2021-07-22 DIAGNOSIS — H65191 Other acute nonsuppurative otitis media, right ear: Secondary | ICD-10-CM

## 2021-07-22 LAB — RESP PANEL BY RT-PCR (FLU A&B, COVID) ARPGX2
Influenza A by PCR: NEGATIVE
Influenza B by PCR: NEGATIVE
SARS Coronavirus 2 by RT PCR: NEGATIVE

## 2021-07-22 MED ORDER — IPRATROPIUM-ALBUTEROL 0.5-2.5 (3) MG/3ML IN SOLN
3.0000 mL | Freq: Once | RESPIRATORY_TRACT | Status: AC
  Administered 2021-07-22: 3 mL via RESPIRATORY_TRACT
  Filled 2021-07-22: qty 3

## 2021-07-22 MED ORDER — ALBUTEROL SULFATE HFA 108 (90 BASE) MCG/ACT IN AERS: 1.0000 | INHALATION_SPRAY | Freq: Four times a day (QID) | RESPIRATORY_TRACT | 0 refills | Status: DC | PRN

## 2021-07-22 NOTE — ED Triage Notes (Signed)
Pt reports congestion and scratchy throat since Wednesday.  States she feels like congestion is now going into chest and causing wheezing.  Negative COVID test on 12/1.

## 2021-07-22 NOTE — ED Provider Notes (Signed)
[facility] EMERGENCY DEPARTMENT Provider Note   CSN: [id number] Arrival date & time: 07/22/21  1106     History Chief Complaint  Patient presents with   Nasal Congestion    Jane [name] is a 65 y.o. female who presents for evaluation of shortness of breath, congestion worsening cough over the last 3 days.  She endorses some mild sinus tenderness and headaches as well.  She has not been around anyone has been sick.  No alleviating or aggravating factors.  She came in today out of concern that the cough started to feel more localized to her chest and becoming more productive.  She denies fevers, chills, chest pain, abdominal pain, nausea/vomiting/diarrhea.  She has no history of lung disease or COPD.  HPI     Past Medical History:  Diagnosis Date   Acid reflux    Arthritis    Hypertension    Reflux    Vasculitis [facility])     Patient Active Problem List   Diagnosis Date Noted   Gastroesophageal reflux disease 01/29/2018   Abdominal pain, epigastric 01/29/2018   Plantar fasciitis, bilateral 02/29/2016   Metatarsal deformity 02/29/2016   Tenosynovitis of foot 02/29/2016    Past Surgical History:  Procedure Laterality Date   COLONOSCOPY     TUBAL LIGATION       OB History   No obstetric history on file.     Family History  Problem Relation Age of Onset   Alcoholism Mother    Lung disease Maternal Grandmother    Diabetes Maternal Uncle    Colon cancer Neg Hx    Stomach cancer Neg Hx     Social History   Tobacco Use   Smoking status: Never   Smokeless tobacco: Never  Vaping Use   Vaping Use: Never used  Substance Use Topics   Alcohol use: No    Alcohol/week: 0.0 standard drinks   Drug use: No    Home Medications Prior to Admission medications   Medication Sig Start Date End Date Taking? Authorizing Provider  acetaminophen (TYLENOL) 500 MG tablet Take 1,000 mg by mouth every 6 (six) hours as needed for mild pain.    [provider]  amoxicillin-clavulanate (AUGMENTIN) 875-125 MG tablet Take 1 tablet by mouth every 12 (twelve) hours. 04/25/21   Kommor, Madison, MD  atorvastatin (LIPITOR) 20 MG tablet Take 20 mg by mouth every evening.    [provider]  B Complex Vitamins (VITAMIN-B COMPLEX) TABS Take 1 tablet by mouth daily.     [provider]  chlorhexidine (PERIDEX) 0.12 % solution Rinse mouth with 70mL for 30 seconds twice daily. Patient taking differently: Use as directed 15 mLs in the mouth or throat 2 (two) times daily. 03/11/21   Jaynee Eagles, PA-C  Cholecalciferol 25 MCG (1000 UT) capsule Take 1,000 Units by mouth daily.     [provider]  dicyclomine (BENTYL) 20 MG tablet Take 1 tablet (20 mg total) by mouth 2 (two) times daily as needed for spasms. 07/02/21   Scot Jun, FNP  esomeprazole (NEXIUM) 40 MG capsule TAKE 1 CAPSULE BY MOUTH DAILY AT 12 NOON Patient taking differently: Take 40 mg by mouth daily. 10/03/20   Mauri Pole, MD  ibuprofen (ADVIL) 200 MG tablet Take 400 mg by mouth every 6 (six) hours as needed for mild pain.    [provider]  lisinopril-hydrochlorothiazide (ZESTORETIC) 20-12.5 MG tablet Take 1 tablet by mouth daily. 01/10/20   Blanchie Serve  Collie Siad, MD  MAGNESIUM-CALCIUM-FOLIC ACID PO Take 1 tablet by mouth daily.    [provider]    Allergies    Iodinated diagnostic agents  Review of Systems   Review of Systems  Constitutional:  Negative for chills and fever.  HENT:  Negative for sore throat.   Eyes:  Negative for redness and visual disturbance.  Respiratory:  Positive for cough and shortness of breath. Negative for chest tightness.   Cardiovascular:  Negative for chest pain.  Gastrointestinal:  Negative for abdominal pain, diarrhea and vomiting.  Endocrine: Negative.   Genitourinary: Negative.   Musculoskeletal:  Negative for myalgias.  Skin: Negative.   Neurological: Negative.   Psychiatric/Behavioral:  Negative.    All other systems reviewed and are negative.  Physical Exam Updated Vital Signs BP (!) 173/84 (BP Location: Left Arm)   Pulse 82   Temp 98.5 F (36.9 C) (Oral)   Resp 16   SpO2 96%   Physical Exam Vitals and nursing note reviewed.  Constitutional:      General: She is not in acute distress.    Appearance: She is ill-appearing.  HENT:     Head: Atraumatic.     Nose: Nose normal.  Eyes:     Extraocular Movements: Extraocular movements intact.     Conjunctiva/sclera: Conjunctivae normal.  Cardiovascular:     Rate and Rhythm: Normal rate and regular rhythm.     Pulses: Normal pulses.          Radial pulses are 2+ on the right side and 2+ on the left side.       Dorsalis pedis pulses are 2+ on the right side and 2+ on the left side.     Heart sounds: No murmur heard. Pulmonary:     Effort: Pulmonary effort is normal. No accessory muscle usage or respiratory distress.     Breath sounds: Normal breath sounds.     Comments: There are some mild expiratory wheezing in the right lower lobe.  Stiff lungs clear to auscultation.  No accessory muscle usage or respiratory distress. Abdominal:     General: Abdomen is flat. There is no distension.     Palpations: Abdomen is soft.     Tenderness: There is no abdominal tenderness.  Musculoskeletal:        General: Normal range of motion.     Cervical back: Normal range of motion.  Skin:    General: Skin is warm and dry.     Capillary Refill: Capillary refill takes less than 2 seconds.  Neurological:     General: No focal deficit present.     Mental Status: She is alert.  Psychiatric:        Mood and Affect: Mood normal.    ED Results / Procedures / Treatments   Labs (all labs ordered are listed, but only abnormal results are displayed) Labs Reviewed  RESP PANEL BY RT-PCR (FLU A&B, COVID) [id number]    EKG None  Radiology DG Chest 2 View  Result Date: 07/22/2021 CLINICAL DATA:  Shortness of breath. EXAM: CHEST - 2  VIEW COMPARISON:  01/25/2021 FINDINGS: Small linear densities in the left lower chest could represent atelectasis. Otherwise, the lungs are clear. No pleural effusions. Heart and mediastinum are within normal limits. Trachea is midline. No acute bone abnormality. IMPRESSION: No active cardiopulmonary disease. Electronically Signed   By: Markus Daft M.D.   On: 07/22/2021 11:58    Procedures Procedures   Medications Ordered in ED Medications  ipratropium-albuterol (  DUONEB) 0.5-2.5 (3) MG/3ML nebulizer solution 3 mL (3 mLs Nebulization Given 07/22/21 1238)    ED Course  I have reviewed the triage vital signs and the nursing notes.  Pertinent labs & imaging results that were available during my care of the patient were reviewed by me and considered in my medical decision making (see chart for details).    MDM Rules/Calculators/A&P                         Initial impression: This is a 65 year old female who presents for cough and congestion symptoms further described above.  On exam she is nontoxic-appearing no acute respiratory distress no accessory muscle usage.  There was some mild expiratory wheezing in the right lower lobe.  Oxygen 96% on room air, nontachycardic nontachypneic.  Patient was given DuoNeb in ED.  Workup: Chest x-ray without signs of infiltrate or consolidation.  Respiratory panel negative.  Reassessment: Reassessment patient symptoms are markedly improved, with improvement in breathing status.  Rule-out: Low suspicion for pneumonia given normal chest x-ray, nontoxic-appearing; low suspicion for PE as patient is nontoxic appearing, nontachycardic, no prior history; low suspicion for sinus infection as patient has not had fevers, no acute tenderness on sinus palpation   Plan:  Viral upper respiratory infection-patient's: Flu is negative.  Likely has another type of viral upper respiratory infection given her improvement with DuoNeb.  Patient's vitals are stable in ED, physical  exam normal will discharge home with albuterol, Tessalon for cough and breathing management.  Discussed red flag symptoms when to return to ED.  Patient agrees amenable to plan.    Final Clinical Impression(s) / ED Diagnoses Final diagnoses:  Viral upper respiratory tract infection  Acute effusion of right ear    Rx / DC Orders ED Discharge Orders          Ordered    albuterol (VENTOLIN HFA) 108 (90 Base) MCG/ACT inhaler  Every 6 hours PRN        07/22/21 [place], Brice Potteiger R, PA-C 07/22/21 2157    Dorie Rank, MD 07/23/21 [phone]

## 2021-07-22 NOTE — Discharge Instructions (Addendum)
You were diagnosed today with a viral respiratory infection as well as some fluid behind the ear drums. This isn't infected at this point, so use OTC decongestants such as Mucinex to relieve congestion to prevent infection. I have also sent you in an inhaler to use whenever you have difficulty breathing.  Feel better soon!

## 2021-07-29 ENCOUNTER — Encounter (HOSPITAL_COMMUNITY): Payer: Self-pay

## 2021-07-29 ENCOUNTER — Other Ambulatory Visit: Payer: Self-pay

## 2021-07-29 ENCOUNTER — Ambulatory Visit (HOSPITAL_COMMUNITY)
Admission: EM | Admit: 2021-07-29 | Discharge: 2021-07-29 | Disposition: A | Discharge status: Home / Self Care | Payer: Medicare Other

## 2021-07-29 DIAGNOSIS — I10 Essential (primary) hypertension: Secondary | ICD-10-CM | POA: Diagnosis not present

## 2021-07-29 DIAGNOSIS — J209 Acute bronchitis, unspecified: Secondary | ICD-10-CM

## 2021-07-29 NOTE — ED Provider Notes (Signed)
[place] urgent Care  ____________________________________________  Time seen: Approximately 6:53 PM  I have reviewed the triage vital signs and the nursing notes.   HISTORY  Chief Complaint Hypertension    HPI Jane [name] is a 65 y.o. female who presents to the urgent care concerned about her blood pressure.  Patient states that she has been having some difficulties controlling her blood pressure and her primary care is just raise her blood pressure medicine.  She has not started taking the increased dosing.  She states that she was diagnosed with bronchitis, has been on steroids, albuterol, antibiotic and cough medications.  Today patient checked her blood pressure and it was 170/110.  Given the elevation she was concerned.  She has no chest pain, shortness of breath, headache, unilateral weakness, dizziness.  Patient is here for evaluation of her elevated blood pressure.  She states that her blood pressure typically runs 150s over 90s.  Again her primary care has been adjusting her meds for this chronic elevation in her blood pressure but she has not started the increased dosing yet.       Past Medical History:  Diagnosis Date   Acid reflux    Arthritis    Hypertension    Reflux    Vasculitis [facility])     Patient Active Problem List   Diagnosis Date Noted   Gastroesophageal reflux disease 01/29/2018   Abdominal pain, epigastric 01/29/2018   Plantar fasciitis, bilateral 02/29/2016   Metatarsal deformity 02/29/2016   Tenosynovitis of foot 02/29/2016    Past Surgical History:  Procedure Laterality Date   COLONOSCOPY     TUBAL LIGATION      Prior to Admission medications   Medication Sig Start Date End Date Taking? Authorizing Provider  acetaminophen (TYLENOL) 500 MG tablet Take 1,000 mg by mouth every 6 (six) hours as needed for mild pain.   Yes [provider]  albuterol (VENTOLIN HFA) 108 (90 Base) MCG/ACT inhaler Inhale 1-2 puffs into the lungs every 6  (six) hours as needed for wheezing or shortness of breath. 07/22/21  Yes Kathe Becton R, PA-C  amoxicillin-clavulanate (AUGMENTIN) 875-125 MG tablet Take 1 tablet by mouth every 12 (twelve) hours. 04/25/21  Yes Kommor, Madison, MD  atorvastatin (LIPITOR) 20 MG tablet Take 20 mg by mouth every evening.   Yes [provider]  B Complex Vitamins (VITAMIN-B COMPLEX) TABS Take 1 tablet by mouth daily.    Yes [provider]  chlorhexidine (PERIDEX) 0.12 % solution Rinse mouth with 35mL for 30 seconds twice daily. Patient taking differently: Use as directed 15 mLs in the mouth or throat 2 (two) times daily. 03/11/21  Yes Jaynee Eagles, PA-C  Cholecalciferol 25 MCG (1000 UT) capsule Take 1,000 Units by mouth daily.    Yes [provider]  dicyclomine (BENTYL) 20 MG tablet Take 1 tablet (20 mg total) by mouth 2 (two) times daily as needed for spasms. 07/02/21  Yes Scot Jun, FNP  esomeprazole (NEXIUM) 40 MG capsule TAKE 1 CAPSULE BY MOUTH DAILY AT 12 NOON Patient taking differently: Take 40 mg by mouth daily. 10/03/20  Yes Nandigam, Venia Minks, MD  ibuprofen (ADVIL) 200 MG tablet Take 400 mg by mouth every 6 (six) hours as needed for mild pain.   Yes [provider]  lisinopril-hydrochlorothiazide (ZESTORETIC) 20-12.5 MG tablet Take 1 tablet by mouth daily. 01/10/20  Yes Raylene Everts, MD  MAGNESIUM-CALCIUM-FOLIC ACID PO Take 1 tablet by mouth daily.   Yes [provider]  Allergies Iodinated diagnostic agents  Family History  Problem Relation Age of Onset   Alcoholism Mother    Lung disease Maternal Grandmother    Diabetes Maternal Uncle    Colon cancer Neg Hx    Stomach cancer Neg Hx     Social History Social History   Tobacco Use   Smoking status: Never   Smokeless tobacco: Never  Vaping Use   Vaping Use: Never used  Substance Use Topics   Alcohol use: No    Alcohol/week: 0.0 standard drinks   Drug use: No     Review of  Systems  Constitutional: No fever/chills Eyes: No visual changes. No discharge ENT: No upper respiratory complaints. Cardiovascular: no chest pain. Respiratory: no cough. No SOB. Gastrointestinal: No abdominal pain.  No nausea, no vomiting.  No diarrhea.  No constipation. Musculoskeletal: Negative for musculoskeletal pain. Skin: Negative for rash, abrasions, lacerations, ecchymosis. Neurological: Negative for headaches, focal weakness or numbness.  10 System ROS otherwise negative.  ____________________________________________   PHYSICAL EXAM:  VITAL SIGNS: ED Triage Vitals  Enc Vitals Group     BP 07/29/21 1817 (!) 179/99     Pulse Rate 07/29/21 1817 70     Resp 07/29/21 1817 18     Temp 07/29/21 1817 98.5 F (36.9 C)     Temp Source 07/29/21 1817 Oral     SpO2 07/29/21 1817 97 %     Weight 07/29/21 1816 216 lb (98 kg)     Height 07/29/21 1816 5\' 5"  (1.651 m)     Head Circumference --      Peak Flow --      Pain Score 07/29/21 1815 0     Pain Loc --      Pain Edu? --      Excl. in [place]? --      Constitutional: Alert and oriented. Well appearing and in no acute distress. Eyes: Conjunctivae are normal. PERRL. EOMI. Head: Atraumatic. ENT:      Ears:       Nose: No congestion/rhinnorhea.      Mouth/Throat: Mucous membranes are moist.  Neck: No stridor.    Cardiovascular: Normal rate, regular rhythm. Normal S1 and S2.  Good peripheral circulation. Respiratory: Normal respiratory effort without tachypnea or retractions. Lungs CTAB. Good air entry to the bases with no decreased or absent breath sounds. Gastrointestinal: Bowel sounds 4 quadrants. Soft and nontender to palpation. No guarding or rigidity. No palpable masses. No distention. No CVA tenderness. Musculoskeletal: Full range of motion to all extremities. No gross deformities appreciated. Neurologic:  Normal speech and language. No gross focal neurologic deficits are appreciated.  Cranial nerves grossly intact at  this time Skin:  Skin is warm, dry and intact. No rash noted. Psychiatric: Mood and affect are normal. Speech and behavior are normal. Patient exhibits appropriate insight and judgement.   ____________________________________________   LABS (all labs ordered are listed, but only abnormal results are displayed)  Labs Reviewed - No data to display ____________________________________________  EKG   ____________________________________________  RADIOLOGY   No results found.  ____________________________________________    PROCEDURES  Procedure(s) performed:    Procedures    Medications - No data to display   ____________________________________________   INITIAL IMPRESSION / ASSESSMENT AND PLAN / ED COURSE  Pertinent labs & imaging results that were available during my care of the patient were reviewed by me and considered in my medical decision making (see chart for details).  Review of the [place] CSRS was performed  in accordance of the [place] prior to dispensing any controlled drugs.           Patient's diagnosis is consistent with elevated blood pressure reading, bronchitis.  Patient presents to the urgent care complaining of increased blood pressure readings at home.  Patient is already been struggling maintaining her blood pressure and her primary care has just increased her dosing.  Patient has not started this increased dosing yet.  Given the fact that patient is currently being treated for bronchitis and is on steroids, antibiotics, cough medications, albuterol I would suspect a slight elevation in her blood pressure from her normal due to the fact.  Patient states that her normal blood pressure is 150s over 90s so and increased to 170s over 90s is not significantly elevated at this time.  She has no concerning signs or symptoms.  I encouraged the patient to start her increased dosing of blood pressure medication but do not feel that the patient warrants imaging,  labs or further work-up at this time.  Patient is stable for discharge. Patient is given ED precautions to return to the ED for any worsening or new symptoms.     ____________________________________________  FINAL CLINICAL IMPRESSION(S) / DIAGNOSES  Final diagnoses:  Primary hypertension  Acute bronchitis, unspecified organism      NEW MEDICATIONS STARTED DURING THIS VISIT:  ED Discharge Orders     None           This chart was dictated using voice recognition software/Dragon. Despite best efforts to proofread, errors can occur which can change the meaning. Any change was purely unintentional.    Darletta Moll, PA-C 07/29/21 1913

## 2021-07-29 NOTE — ED Triage Notes (Signed)
Pt c/o high blood pressure. Pt states that she has been taking steroids and antibiotics due to bronchitis. Pt states that when taking her medication today her blood pressure went up to 172/114.   Pt states that she felt lightheaded earlier today. She did take her atorvastatin medication today.  Pt denies any headache, dizziness, and chest pain.

## 2021-10-09 ENCOUNTER — Ambulatory Visit: Payer: Medicare Other | Admitting: Family Medicine

## 2021-10-09 ENCOUNTER — Telehealth: Payer: Self-pay

## 2021-10-09 NOTE — Telephone Encounter (Signed)
NP no show... letter sent

## 2021-10-09 NOTE — Telephone Encounter (Deleted)
TOC no show, letter sent

## 2021-10-17 ENCOUNTER — Telehealth: Payer: Self-pay | Admitting: Family Medicine

## 2021-10-17 ENCOUNTER — Other Ambulatory Visit: Payer: Self-pay | Admitting: Orthopaedic Surgery

## 2021-10-17 DIAGNOSIS — M79671 Pain in right foot: Secondary | ICD-10-CM

## 2021-10-17 NOTE — Telephone Encounter (Signed)
FYI:Pt is saying she called in the night before her appointment on 10/08/21 to cancel her 10/09/21 appointment for a new pt. She is saying someone would let us know.

## 2021-10-25 ENCOUNTER — Emergency Department (HOSPITAL_COMMUNITY): Payer: Medicare Other

## 2021-10-25 ENCOUNTER — Encounter (HOSPITAL_COMMUNITY): Payer: Self-pay

## 2021-10-25 ENCOUNTER — Emergency Department (HOSPITAL_COMMUNITY)
Admission: EM | Admit: 2021-10-25 | Discharge: 2021-10-25 | Disposition: A | Discharge status: Home / Self Care | Payer: Medicare Other | Attending: Emergency Medicine | Admitting: Emergency Medicine

## 2021-10-25 ENCOUNTER — Other Ambulatory Visit: Payer: Self-pay

## 2021-10-25 DIAGNOSIS — R1084 Generalized abdominal pain: Secondary | ICD-10-CM | POA: Diagnosis not present

## 2021-10-25 DIAGNOSIS — R5383 Other fatigue: Secondary | ICD-10-CM | POA: Insufficient documentation

## 2021-10-25 DIAGNOSIS — R1012 Left upper quadrant pain: Secondary | ICD-10-CM | POA: Diagnosis present

## 2021-10-25 DIAGNOSIS — R1032 Left lower quadrant pain: Secondary | ICD-10-CM | POA: Diagnosis not present

## 2021-10-25 LAB — CBC
HCT: 41.4 % (ref 36.0–46.0)
Hemoglobin: 13.5 g/dL (ref 12.0–15.0)
MCH: 30.8 pg (ref 26.0–34.0)
MCHC: 32.6 g/dL (ref 30.0–36.0)
MCV: 94.3 fL (ref 80.0–100.0)
Platelets: 219 10*3/uL (ref 150–400)
RBC: 4.39 MIL/uL (ref 3.87–5.11)
RDW: 12.8 % (ref 11.5–15.5)
WBC: 8.1 10*3/uL (ref 4.0–10.5)
nRBC: 0 % (ref 0.0–0.2)

## 2021-10-25 LAB — COMPREHENSIVE METABOLIC PANEL
ALT: 22 U/L (ref 0–44)
AST: 24 U/L (ref 15–41)
Albumin: 3.9 g/dL (ref 3.5–5.0)
Alkaline Phosphatase: 78 U/L (ref 38–126)
Anion gap: 9 (ref 5–15)
BUN: 18 mg/dL (ref 8–23)
CO2: 29 mmol/L (ref 22–32)
Calcium: 9.4 mg/dL (ref 8.9–10.3)
Chloride: 101 mmol/L (ref 98–111)
Creatinine, Ser: 0.93 mg/dL (ref 0.44–1.00)
GFR, Estimated: >60 mL/min (ref >60)
Glucose, Bld: 97 mg/dL (ref 70–99)
Potassium: 4.2 mmol/L (ref 3.5–5.1)
Sodium: 139 mmol/L (ref 135–145)
Total Bilirubin: 0.6 mg/dL (ref 0.3–1.2)
Total Protein: 7.5 g/dL (ref 6.5–8.1)

## 2021-10-25 LAB — URINALYSIS, ROUTINE W REFLEX MICROSCOPIC
Bilirubin Urine: NEGATIVE
Glucose, UA: NEGATIVE mg/dL
Hgb urine dipstick: NEGATIVE
Ketones, ur: NEGATIVE mg/dL
Nitrite: NEGATIVE
Protein, ur: NEGATIVE mg/dL
Specific Gravity, Urine: 1.018 (ref 1.005–1.030)
pH: 5 (ref 5.0–8.0)

## 2021-10-25 LAB — LIPASE, BLOOD: Lipase: 35 U/L (ref 11–51)

## 2021-10-25 NOTE — Discharge Instructions (Signed)
Follow-up with your primary doctor and your gastroenterologist.  Come back to ER if you develop worsening pain, vomiting or other new concerning symptom. ?

## 2021-10-25 NOTE — ED Notes (Signed)
Pt to CT

## 2021-10-25 NOTE — ED Provider Notes (Signed)
[facility] EMERGENCY DEPARTMENT Provider Note   CSN: [id number] Arrival date & time: 10/25/21  0830     History  Chief Complaint  Patient presents with   Abdominal Pain   Fatigue    Jane [name] is a 66 y.o. female.  Presenting to the ER with concern for abdominal pain, fatigue.  Patient states that she suffered from intermittent abdominal pain for a while, current episode has been going on for the last couple days.  States that it seems to get worse with certain foods, worse in her upper and left upper abdomen.  But is having some pain in her left or lower abdomen at times.  Pain is currently mild and she does not want any medication for her pain right now.  No vomiting, no diarrhea, no fevers.  HPI     Home Medications Prior to Admission medications   Medication Sig Start Date End Date Taking? Authorizing Provider  acetaminophen (TYLENOL) 500 MG tablet Take 1,000 mg by mouth every 6 (six) hours as needed for mild pain.    [provider]  albuterol (VENTOLIN HFA) 108 (90 Base) MCG/ACT inhaler Inhale 1-2 puffs into the lungs every 6 (six) hours as needed for wheezing or shortness of breath. 07/22/21   Tonye Pearson, PA-C  amoxicillin-clavulanate (AUGMENTIN) 875-125 MG tablet Take 1 tablet by mouth every 12 (twelve) hours. 04/25/21   Kommor, [place], MD  atorvastatin (LIPITOR) 20 MG tablet Take 20 mg by mouth every evening.    [provider]  B Complex Vitamins (VITAMIN-B COMPLEX) TABS Take 1 tablet by mouth daily.     [provider]  chlorhexidine (PERIDEX) 0.12 % solution Rinse mouth with 53m for 30 seconds twice daily. Patient taking differently: Use as directed 15 mLs in the mouth or throat 2 (two) times daily. 03/11/21   MJaynee Eagles PA-C  Cholecalciferol 25 MCG (1000 UT) capsule Take 1,000 Units by mouth daily.     [provider]  dicyclomine (BENTYL) 20 MG tablet Take 1 tablet (20 mg total) by mouth 2 (two) times daily  as needed for spasms. 07/02/21   HScot Jun FNP  esomeprazole (NEXIUM) 40 MG capsule TAKE 1 CAPSULE BY MOUTH DAILY AT 12 NOON Patient taking differently: Take 40 mg by mouth daily. 10/03/20   NMauri Pole MD  ibuprofen (ADVIL) 200 MG tablet Take 400 mg by mouth every 6 (six) hours as needed for mild pain.    [provider]  lisinopril-hydrochlorothiazide (ZESTORETIC) 20-12.5 MG tablet Take 1 tablet by mouth daily. 01/10/20   NRaylene Everts MD  MAGNESIUM-CALCIUM-FOLIC ACID PO Take 1 tablet by mouth daily.    [provider]      Allergies    Iodinated contrast media    Review of Systems   Review of Systems  Constitutional:  Negative for chills and fever.  HENT:  Negative for ear pain and sore throat.   Eyes:  Negative for pain and visual disturbance.  Respiratory:  Negative for cough and shortness of breath.   Cardiovascular:  Negative for chest pain and palpitations.  Gastrointestinal:  Positive for abdominal pain. Negative for vomiting.  Genitourinary:  Negative for dysuria and hematuria.  Musculoskeletal:  Negative for arthralgias and back pain.  Skin:  Negative for color change and rash.  Neurological:  Negative for seizures and syncope.  All other systems reviewed and are negative.  Physical Exam Updated Vital Signs BP 121/61    Pulse (!) 57  Temp 98.1 F (36.7 C) (Oral)    Resp 16    Ht '5\' 5"'$  (1.651 m)    Wt 97.5 kg    SpO2 100%    BMI 35.78 kg/m  Physical Exam Vitals and nursing note reviewed.  Constitutional:      General: She is not in acute distress.    Appearance: She is well-developed.  HENT:     Head: Normocephalic and atraumatic.  Eyes:     Conjunctiva/sclera: Conjunctivae normal.  Cardiovascular:     Rate and Rhythm: Normal rate and regular rhythm.     Heart sounds: No murmur heard. Pulmonary:     Effort: Pulmonary effort is normal. No respiratory distress.     Breath sounds: Normal breath sounds.  Abdominal:      Palpations: Abdomen is soft.     Tenderness: There is abdominal tenderness in the left upper quadrant and left lower quadrant. There is no guarding or rebound.  Musculoskeletal:        General: No swelling.     Cervical back: Neck supple.  Skin:    General: Skin is warm and dry.     Capillary Refill: Capillary refill takes less than 2 seconds.  Neurological:     Mental Status: She is alert.  Psychiatric:        Mood and Affect: Mood normal.    ED Results / Procedures / Treatments   Labs (all labs ordered are listed, but only abnormal results are displayed) Labs Reviewed  URINALYSIS, ROUTINE W REFLEX MICROSCOPIC - Abnormal; Notable for the following components:      Result Value   APPearance HAZY (*)    Leukocytes,Ua MODERATE (*)    Bacteria, UA RARE (*)    All other components within normal limits  LIPASE, BLOOD  COMPREHENSIVE METABOLIC PANEL  CBC    EKG None  Radiology CT ABDOMEN PELVIS WO CONTRAST  Result Date: 10/25/2021 CLINICAL DATA:  Acute mid to lower abdominal pain EXAM: CT ABDOMEN AND PELVIS WITHOUT CONTRAST TECHNIQUE: Multidetector CT imaging of the abdomen and pelvis was performed following the standard protocol without IV contrast. RADIATION DOSE REDUCTION: This exam was performed according to the departmental dose-optimization program which includes automated exposure control, adjustment of the mA and/or kV according to patient size and/or use of iterative reconstruction technique. COMPARISON:  04/25/2021 FINDINGS: Lower chest: Included lung bases are clear.  Heart size is normal. Hepatobiliary: Unremarkable unenhanced appearance of the liver. No focal liver lesion identified. Gallbladder within normal limits. No hyperdense gallstone. No biliary dilatation. Pancreas: Unremarkable. No pancreatic ductal dilatation or surrounding inflammatory changes. Spleen: Normal in size without focal abnormality. Adrenals/Urinary Tract: Adrenal glands are unremarkable. Kidneys are  normal, without renal calculi, focal lesion, or hydronephrosis. Bladder is unremarkable. Stomach/Bowel: Small hiatal hernia. Stomach otherwise within normal limits. No dilated loops of bowel. Colonic diverticulosis without findings of acute diverticulitis. 9 x 5 mm appendicolith is again seen within a noninflamed appendix. Vascular/Lymphatic: Scattered aortoiliac atherosclerotic calcifications without aneurysm. No abdominopelvic lymphadenopathy. Reproductive: Uterus and bilateral adnexa are unremarkable. Other: No free fluid. No abdominopelvic fluid collection. No pneumoperitoneum. No abdominal wall hernia. Musculoskeletal: No acute or significant osseous findings. IMPRESSION: 1. No acute abdominopelvic findings. 2. Colonic diverticulosis without findings of acute diverticulitis. 3. Stable calcified appendicolith within an otherwise normal appearing appendix. 4. Small hiatal hernia. 5. Aortic Atherosclerosis (ICD10-I70.0). Electronically Signed   By: Davina Poke D.O.   On: 10/25/2021 13:21    Procedures Procedures    Medications Ordered  in ED Medications - No data to display  ED Course/ Medical Decision Making/ A&P                           Medical Decision Making Amount and/or Complexity of Data Reviewed Labs: ordered. Radiology: ordered.   66 year old lady presenting to ER with concern for left-sided abdominal pain and fatigue.  On exam well-appearing in no distress, noted mild tenderness in her left upper quadrant and left lower quadrant.  Labs reviewed, no leukocytosis, no transaminitis, negative UA.  I independently reviewed CT imaging, per radiology report, no acute findings, specifically no evidence for diverticulitis, abscess or other acute abdominal complication.  Given reassuring work-up and patient's minimal ongoing symptoms, feel she can be discharged and managed in the outpatient setting.  Patient states that she has follow-up scheduled with a gastroenterologist.  Advised her to  keep this appointment, continue PPI in case of GERD/gastritis causing symptoms.  Reviewed return precautions and discharged home.    After the discussed management above, the patient was determined to be safe for discharge.  The patient was in agreement with this plan and all questions regarding their care were answered.  ED return precautions were discussed and the patient will return to the ED with any significant worsening of condition.        Final Clinical Impression(s) / ED Diagnoses Final diagnoses:  Generalized abdominal pain    Rx / DC Orders ED Discharge Orders     None         Lucrezia Starch, MD 10/25/21 1335

## 2021-10-31 NOTE — Telephone Encounter (Signed)
Pt notified after hours the day before - no charge ?

## 2021-11-01 ENCOUNTER — Ambulatory Visit (INDEPENDENT_AMBULATORY_CARE_PROVIDER_SITE_OTHER): Payer: Medicare Other | Admitting: Gastroenterology

## 2021-11-01 ENCOUNTER — Encounter: Payer: Self-pay | Admitting: Gastroenterology

## 2021-11-01 VITALS — BP 124/78 | HR 64 | Ht 65.0 in | Wt 211.0 lb

## 2021-11-01 DIAGNOSIS — R112 Nausea with vomiting, unspecified: Secondary | ICD-10-CM | POA: Diagnosis not present

## 2021-11-01 DIAGNOSIS — K219 Gastro-esophageal reflux disease without esophagitis: Secondary | ICD-10-CM

## 2021-11-01 MED ORDER — FAMOTIDINE 20 MG PO TABS: 20.0000 mg | ORAL_TABLET | Freq: Two times a day (BID) | ORAL | 5 refills | Status: DC

## 2021-11-01 NOTE — Progress Notes (Signed)
? ? ? ?11/01/2021 ?Jane [name] ?[id number] ?March 11, 1956 ? ? ?HISTORY OF PRESENT ILLNESS: This is a 66 year old female who is a patient of Jane [name].  She is here today for issues related to acid reflux, nausea, upper abdominal discomfort.  She is on Nexium 40 mg daily, which she has been on for a while.  She says that she was having some issues with some upper abdominal pain and some nausea last week, but then now has been better for the past few days.  She thought maybe the medication was making her nauseated.  She does admit to a lot of issues with anxiety recently.  Recent CBC, CMP, lipase by her PCP all normal. ? ?EGD was performed through [facility] by Jane [name] in September 2016 at which time she was found to have small hiatal hernia, fundic gland polyp, and mild erythema in the duodenum.  Gastric biopsies actually just showed oxyntic mucosa with PPI effect and foveolar hyperplasia.  Duodenal biopsies were normal.  Biopsies results are in [place]. ? ? ?Past Medical History:  ?Diagnosis Date  ? Acid reflux   ? Arthritis   ? Hypertension   ? Reflux   ? Vasculitis ([place])   ? ?Past Surgical History:  ?Procedure Laterality Date  ? COLONOSCOPY    ? TUBAL LIGATION    ? ? reports that she has never smoked. She has never used smokeless tobacco. She reports that she does not drink alcohol and does not use drugs. ?family history includes Alcoholism in her mother; Diabetes in her maternal uncle; Lung disease in her maternal grandmother. ?Allergies  ?Allergen Reactions  ? Iodinated Contrast Media Itching  ?  Pt developed facial itching after contrast administration.  ? ? ?  ?Outpatient Encounter Medications as of 11/01/2021  ?Medication Sig  ? acetaminophen (TYLENOL) 500 MG tablet Take 1,000 mg by mouth every 6 (six) hours as needed for mild pain.  ? albuterol (VENTOLIN HFA) 108 (90 Base) MCG/ACT inhaler Inhale 1-2 puffs into the lungs every 6 (six) hours as needed for wheezing or shortness of breath.  ? atorvastatin  (LIPITOR) 20 MG tablet Take 20 mg by mouth every evening.  ? B Complex Vitamins (VITAMIN-B COMPLEX) TABS Take 1 tablet by mouth daily.   ? Cholecalciferol 25 MCG (1000 UT) capsule Take 1,000 Units by mouth daily.   ? esomeprazole (NEXIUM) 40 MG capsule TAKE 1 CAPSULE BY MOUTH DAILY AT 12 NOON (Patient taking differently: Take 40 mg by mouth daily.)  ? ibuprofen (ADVIL) 200 MG tablet Take 400 mg by mouth every 6 (six) hours as needed for mild pain.  ? lisinopril-hydrochlorothiazide (ZESTORETIC) 20-12.5 MG tablet Take 1 tablet by mouth daily.  ? MAGNESIUM-CALCIUM-FOLIC ACID PO Take 1 tablet by mouth daily.  ? [DISCONTINUED] amoxicillin-clavulanate (AUGMENTIN) 875-125 MG tablet Take 1 tablet by mouth every 12 (twelve) hours.  ? [DISCONTINUED] chlorhexidine (PERIDEX) 0.12 % solution Rinse mouth with 32m for 30 seconds twice daily. (Patient taking differently: Use as directed 15 mLs in the mouth or throat 2 (two) times daily.)  ? [DISCONTINUED] dicyclomine (BENTYL) 20 MG tablet Take 1 tablet (20 mg total) by mouth 2 (two) times daily as needed for spasms.  ? ?Facility-Administered Encounter Medications as of 11/01/2021  ?Medication  ? 0.9 %  sodium chloride infusion  ? ? ? ?REVIEW OF SYSTEMS  : All other systems reviewed and negative except where noted in the History of Present Illness. ? ? ?PHYSICAL EXAM: ?BP 124/78   Pulse 64  Ht '5\' 5"'$  (1.651 m)   Wt 211 lb (95.7 kg)   BMI 35.11 kg/m?  ?General: Well developed female in no acute distress ?Head: Normocephalic and atraumatic ?Eyes:  Sclerae anicteric, conjunctiva pink. ?Ears: Normal auditory acuity ?Lungs: Clear throughout to auscultation; no W/R/R. ?Heart: Regular rate and rhythm; no M/R/G. ?Abdomen: Soft, non-distended.  BS present.  Mild epigastric TTP. ?Musculoskeletal: Symmetrical with no gross deformities  ?Skin: No lesions on visible extremities ?Extremities: No edema  ?Neurological: Alert oriented x 4, grossly non-focal ?Psychological:  Alert and  cooperative. Normal mood and affect ? ?ASSESSMENT AND PLAN: ?*GERD and nausea: She thinks the Nexium helps her reflux, but thought maybe it was causing her some issues with nausea.  After some discussion we decided to have her try Pepcid 20 mg twice daily for a while to see if that helps to control her reflux symptoms instead.  If she does well with that she can just continue on that regimen, but if her reflux worsens then she can go back to the Nexium 40 mg daily.  Of note, she also reports a lot of anxiety and that certainly could be playing into some of her symptoms as well.  Prescription sent to her pharmacy.  She will contact us back in about 4 weeks with an update on her symptoms.  If symptoms return/persist then consider repeat EGD. ? ? ?CC:  Jane December, NP ? ?  ?

## 2021-11-01 NOTE — Patient Instructions (Signed)
If you are age 68 or older, your body mass index should be between 23-30. Your Body mass index is 35.11 kg/m?Marland Kitchen If this is out of the aforementioned range listed, please consider follow up with your Primary Care Provider. ? ?If you are age 47 or younger, your body mass index should be between 19-25. Your Body mass index is 35.11 kg/m?Marland Kitchen If this is out of the aformentioned range listed, please consider follow up with your Primary Care Provider.  ? ?We have sent the following medications to your pharmacy for you to pick up at your convenience: Pepcid 20 mg twice daily. ? ?Stop Nexium. ? ? ?The [place] [facility] providers would like to encourage you to use [facility] to communicate with providers for non-urgent requests or questions.  Due to long hold times on the telephone, sending your provider a message by [facility] may be a faster and more efficient way to get a response.  Please allow 48 business hours for a response.  Please remember that this is for non-urgent requests.  ? ?It was a pleasure to see you today! ? ?Thank you for trusting me with your gastrointestinal care!   ? ?Alonza Bogus, PA-C  ? ?

## 2021-11-14 NOTE — Progress Notes (Signed)
Reviewed and agree with documentation and assessment and plan. K. Veena Keondra Haydu , MD   

## 2021-11-23 ENCOUNTER — Ambulatory Visit
Admission: RE | Admit: 2021-11-23 | Discharge: 2021-11-23 | Disposition: A | Discharge status: Home / Self Care | Payer: Medicare Other | Source: Ambulatory Visit | Attending: Orthopaedic Surgery | Admitting: Orthopaedic Surgery

## 2021-11-23 DIAGNOSIS — M79671 Pain in right foot: Secondary | ICD-10-CM

## 2022-04-01 ENCOUNTER — Encounter (HOSPITAL_COMMUNITY): Payer: Self-pay | Admitting: *Deleted

## 2022-04-01 ENCOUNTER — Emergency Department (HOSPITAL_COMMUNITY)
Admission: EM | Admit: 2022-04-01 | Discharge: 2022-04-02 | Disposition: A | Discharge status: Home / Self Care | Payer: Medicare Other | Attending: Emergency Medicine | Admitting: Emergency Medicine

## 2022-04-01 ENCOUNTER — Other Ambulatory Visit: Payer: Self-pay

## 2022-04-01 DIAGNOSIS — Z79899 Other long term (current) drug therapy: Secondary | ICD-10-CM | POA: Insufficient documentation

## 2022-04-01 DIAGNOSIS — U071 COVID-19: Secondary | ICD-10-CM | POA: Diagnosis not present

## 2022-04-01 DIAGNOSIS — I10 Essential (primary) hypertension: Secondary | ICD-10-CM | POA: Diagnosis not present

## 2022-04-01 DIAGNOSIS — R0981 Nasal congestion: Secondary | ICD-10-CM | POA: Diagnosis present

## 2022-04-01 LAB — SARS CORONAVIRUS 2 BY RT PCR: SARS Coronavirus 2 by RT PCR: POSITIVE — AB

## 2022-04-01 NOTE — ED Triage Notes (Signed)
PT states here for nasal drainage and feels like she needs to have a lot of phlegm and has throat pain when she coughs up the phlegm.  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

## 2022-04-02 ENCOUNTER — Encounter (HOSPITAL_COMMUNITY): Payer: Self-pay | Admitting: Student

## 2022-04-02 DIAGNOSIS — U071 COVID-19: Secondary | ICD-10-CM | POA: Diagnosis not present

## 2022-04-02 LAB — BASIC METABOLIC PANEL
Anion gap: 8 (ref 5–15)
BUN: 12 mg/dL (ref 8–23)
CO2: 27 mmol/L (ref 22–32)
Calcium: 9 mg/dL (ref 8.9–10.3)
Chloride: 100 mmol/L (ref 98–111)
Creatinine, Ser: 0.75 mg/dL (ref 0.44–1.00)
GFR, Estimated: >60 mL/min (ref >60)
Glucose, Bld: 107 mg/dL — ABNORMAL HIGH (ref 70–99)
Potassium: 3.7 mmol/L (ref 3.5–5.1)
Sodium: 135 mmol/L (ref 135–145)

## 2022-04-02 MED ORDER — NIRMATRELVIR/RITONAVIR (PAXLOVID)TABLET: 3.0000 | ORAL_TABLET | Freq: Two times a day (BID) | ORAL | 0 refills | Status: DC

## 2022-04-02 MED ORDER — ACETAMINOPHEN 325 MG PO TABS
650.0000 mg | ORAL_TABLET | Freq: Once | ORAL | Status: AC
  Administered 2022-04-02: 650 mg via ORAL
  Filled 2022-04-02: qty 2

## 2022-04-02 MED ORDER — BENZONATATE 100 MG PO CAPS: 100.0000 mg | ORAL_CAPSULE | Freq: Three times a day (TID) | ORAL | 0 refills | Status: DC

## 2022-04-02 MED ORDER — NIRMATRELVIR/RITONAVIR (PAXLOVID)TABLET: 3.0000 | ORAL_TABLET | Freq: Two times a day (BID) | ORAL | 0 refills | Status: AC

## 2022-04-02 NOTE — ED Provider Notes (Signed)
[facility] EMERGENCY DEPARTMENT Provider Note   CSN: [id number] Arrival date & time: 04/01/22  2026     History  Chief Complaint  Patient presents with   Nasal Congestion    Jane [name] is a 66 y.o. female with a hx of hypertension who presents to the ED with complaints of nasal congestion x 2 days. Patient reports nasal congestion, post nasal drip, productive cough, and subjective fevers. No alleviating/aggravating factors. 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 Denies chest pain, dyspnea, hemoptysis or N/V/D. Has had covid vaccine x 3.   HPI     Home Medications Prior to Admission medications   Medication Sig Start Date End Date Taking? Authorizing Provider  acetaminophen (TYLENOL) 500 MG tablet Take 1,000 mg by mouth every 6 (six) hours as needed for mild pain.    [provider]  albuterol (VENTOLIN HFA) 108 (90 Base) MCG/ACT inhaler Inhale 1-2 puffs into the lungs every 6 (six) hours as needed for wheezing or shortness of breath. 07/22/21   Tonye Pearson, PA-C  atorvastatin (LIPITOR) 20 MG tablet Take 20 mg by mouth every evening.    [provider]  B Complex Vitamins (VITAMIN-B COMPLEX) TABS Take 1 tablet by mouth daily.     [provider]  Cholecalciferol 25 MCG (1000 UT) capsule Take 1,000 Units by mouth daily.     [provider]  esomeprazole (NEXIUM) 40 MG capsule TAKE 1 CAPSULE BY MOUTH DAILY AT 12 NOON Patient taking differently: Take 40 mg by mouth daily. 10/03/20   Mauri Pole, MD  famotidine (PEPCID) 20 MG tablet Take 1 tablet (20 mg total) by mouth 2 (two) times daily. 11/01/21   Zehr, Laban Emperor, PA-C  ibuprofen (ADVIL) 200 MG tablet Take 400 mg by mouth every 6 (six) hours as needed for mild pain.    [provider]  lisinopril-hydrochlorothiazide (ZESTORETIC) 20-12.5 MG tablet Take 1 tablet by mouth daily. 01/10/20   Raylene Everts, MD  MAGNESIUM-CALCIUM-FOLIC ACID PO Take 1 tablet by mouth  daily.    [provider]      Allergies    Iodinated contrast media    Review of Systems   Review of Systems  Constitutional:  Positive for fever.  HENT:  Positive for congestion and postnasal drip. Negative for ear pain.   Respiratory:  Positive for cough.   Cardiovascular:  Negative for chest pain.  Gastrointestinal:  Negative for abdominal pain, diarrhea and vomiting.  Neurological:  Negative for syncope.  All other systems reviewed and are negative.   Physical Exam Updated Vital Signs BP 124/69 (BP Location: Left Arm)   Pulse 72   Temp 99.5 F (37.5 C)   Resp 16   SpO2 99%  Physical Exam Vitals and nursing note reviewed.  Constitutional:      General: She is not in acute distress.    Appearance: She is well-developed.  HENT:     Head: Normocephalic and atraumatic.     Right Ear: Ear canal normal. Tympanic membrane is not perforated, erythematous, retracted or bulging.     Left Ear: Ear canal normal. Tympanic membrane is not perforated, erythematous, retracted or bulging.     Ears:     Comments: No mastoid erythema/swelling/tenderness.     Nose:     Right Sinus: No maxillary sinus tenderness or frontal sinus tenderness.     Left Sinus: No maxillary sinus tenderness or frontal sinus tenderness.     Mouth/Throat:  Pharynx: Uvula midline. No oropharyngeal exudate or posterior oropharyngeal erythema.     Comments: Posterior oropharynx is symmetric appearing. Patient tolerating own secretions without difficulty. No trismus. No drooling. No hot potato voice. No swelling beneath the tongue, submandibular compartment is soft.  Eyes:     General:        Right eye: No discharge.        Left eye: No discharge.     Conjunctiva/sclera: Conjunctivae normal.     Pupils: Pupils are equal, round, and reactive to light.  Cardiovascular:     Rate and Rhythm: Normal rate and regular rhythm.     Heart sounds: No murmur heard. Pulmonary:     Effort: Pulmonary effort is  normal. No respiratory distress.     Breath sounds: Normal breath sounds. No wheezing, rhonchi or rales.  Abdominal:     General: There is no distension.     Palpations: Abdomen is soft.     Tenderness: There is no abdominal tenderness.  Musculoskeletal:     Cervical back: Normal range of motion and neck supple. No edema or rigidity.  Lymphadenopathy:     Cervical: No cervical adenopathy.  Skin:    General: Skin is warm and dry.     Findings: No rash.  Neurological:     Mental Status: She is alert.  Psychiatric:        Behavior: Behavior normal.     ED Results / Procedures / Treatments   Labs (all labs ordered are listed, but only abnormal results are displayed) Labs Reviewed  SARS CORONAVIRUS 2 BY RT PCR - Abnormal; Notable for the following components:      Result Value   SARS Coronavirus 2 by RT PCR POSITIVE (*)    All other components within normal limits  BASIC METABOLIC PANEL    EKG None  Radiology No results found.  Procedures Procedures    Medications Ordered in ED Medications  acetaminophen (TYLENOL) tablet 650 mg (650 mg Oral Given 04/02/22 0335)    ED Course/ Medical Decision Making/ A&P                           Medical Decision Making Amount and/or Complexity of Data Reviewed Labs: ordered.  Risk OTC drugs. Prescription drug management.  Patient presents to the ED with complaints of cough and congestion, this involves an extensive number of treatment options, and is a complaint that carries with it a high risk of complications and morbidity. Nontoxic, vitals w/ elevated BP on arrival- doubt HTN emergency.   Additional history obtained:  Chart/nursing notes reviewed  Lab Tests:  I viewed & interpreted labs including:  Covid 19 testing- positive.   ED Course:  Covid +. Considered CXR, however lungs CTA, sxs < 48 hours- doubt superimposed bacterial pneumonia at this time. Ambulatory SpO2 98-100% on RA without increased work of breathing.  Discussed option of paxlovid including risk/benefit/alternative, patient would like to proceed, BMP ordered ---> unremarkable. Tessalon prescribed for supportive care in addition to paxlovid, instructed not to take her statin while taking paxlovid. Patient appears reasonable for discharge. I discussed results, treatment plan, need for follow-up, and return precautions with the patient. Provided opportunity for questions, patient confirmed understanding and is in agreement with plan.   Portions of this note were generated with [employer]. Dictation errors may occur despite best attempts at proofreading.   Final Clinical Impression(s) / ED Diagnoses Final diagnoses:  [id number]  Rx / DC Orders ED Discharge Orders          Ordered    benzonatate (TESSALON) 100 MG capsule  Every 8 hours        04/02/22 0417    nirmatrelvir/ritonavir EUA (PAXLOVID) 20 x 150 MG & 10 x '100MG'$  TABS  2 times daily        04/02/22 0417            Jane [name] was evaluated in Emergency Department on 04/02/2022 for the symptoms described in the history of present illness. He/she was evaluated in the context of the global COVID-19 pandemic, which necessitated consideration that the patient might be at risk for infection with the SARS-CoV-2 virus that causes COVID-19. Institutional protocols and algorithms that pertain to the evaluation of patients at risk for COVID-19 are in a state of rapid change based on information released by regulatory bodies including the CDC and federal and state organizations. These policies and algorithms were followed during the patient's care in the ED.    Leafy Kindle 04/02/22 0513    Merrily Pew, MD 04/02/22 [phone]

## 2022-04-02 NOTE — ED Notes (Signed)
Patient verbalizes understanding of d/c instructions. Opportunities for questions and answers were provided. Pt d/c from ED and ambulated to lobby.  

## 2022-04-02 NOTE — Discharge Instructions (Addendum)
You were seen in the emergency department today and found to have COVID-19.  Your metabolic panel lab work was reassuring.  We are sending home with Tessalon to take every hours as needed for coughing.  We are also sending you home with Paxlovid the antiviral medication to treat COVID.  Please take these medications as prescribed. Do NOT take your atorvastatin while taking paxlovid.   We have prescribed you new medication(s) today. Discuss the medications prescribed today with your pharmacist as they can have adverse effects and interactions with your other medicines including over the counter and prescribed medications. Seek medical evaluation if you start to experience new or abnormal symptoms after taking one of these medicines, seek care immediately if you start to experience difficulty breathing, feeling of your throat closing, facial swelling, or rash as these could be indications of a more serious allergic reaction   Please follow-up with your primary care provider.  Please follow current CDC guidelines for isolation.  Day 0 is your first day of symptoms.  Stay home for at least 5 days.  If you are feeling improved and fever free for 24 hours after 5 days you may end isolation.  If you are still feeling sick you will need to isolate through 10 days.   Call your primary care provider doctor to schedule a close follow-up appointment.  Return to the emergency department for new or worsening symptoms including but not limited to new or worsening pain, increased work of breathing, passing out, coughing up blood, or any other concerns.

## 2022-04-06 ENCOUNTER — Ambulatory Visit (HOSPITAL_COMMUNITY)
Admission: RE | Admit: 2022-04-06 | Discharge: 2022-04-06 | Disposition: A | Discharge status: Home / Self Care | Payer: Medicare Other | Source: Ambulatory Visit | Attending: Family Medicine | Admitting: Family Medicine

## 2022-04-06 ENCOUNTER — Encounter (HOSPITAL_COMMUNITY): Payer: Self-pay

## 2022-04-06 VITALS — BP 139/67 | HR 69 | Temp 98.2°F | Resp 16 | Ht 65.0 in | Wt 211.0 lb

## 2022-04-06 DIAGNOSIS — U071 COVID-19: Secondary | ICD-10-CM | POA: Diagnosis not present

## 2022-04-06 MED ORDER — PREDNISONE 20 MG PO TABS: 40.0000 mg | ORAL_TABLET | Freq: Every day | ORAL | 0 refills | Status: AC

## 2022-04-06 MED ORDER — ALBUTEROL SULFATE HFA 108 (90 BASE) MCG/ACT IN AERS: 2.0000 | INHALATION_SPRAY | RESPIRATORY_TRACT | 0 refills | Status: DC | PRN

## 2022-04-06 NOTE — ED Provider Notes (Signed)
[place]    CSN: [id number] Arrival date & time: 04/06/22  0855      History   Chief Complaint Chief Complaint  Patient presents with   Covid Positive    HPI Jane [name] is a 66 y.o. female.   HPI Here for continued cough and congestion.  She began having COVID symptoms on about August 12.  She was having fever and cough and rhinorrhea.  She was seen in the emergency room and tested positive on August 13.  She was prescribed Paxlovid but only tolerated 2-1/2 days worth.  She has had 3 doses of the COVID-vaccine.  The fever dissipated by August 15, and she actually is overall feeling better.  She does still have some feeling of congestion in her throat and when she coughs she states it feels like when she had bronchitis last year and had to use an inhaler.  She does not have any sinus pressure or headache.  She has no feeling of shortness of breath and no feeling of chest congestion.    Past Medical History:  Diagnosis Date   Acid reflux    Arthritis    Hypertension    Reflux    Vasculitis [facility])     Patient Active Problem List   Diagnosis Date Noted   Nausea and vomiting 11/01/2021   Gastroesophageal reflux disease 01/29/2018   Abdominal pain, epigastric 01/29/2018   Plantar fasciitis, bilateral 02/29/2016   Metatarsal deformity 02/29/2016   Tenosynovitis of foot 02/29/2016    Past Surgical History:  Procedure Laterality Date   COLONOSCOPY     TUBAL LIGATION      OB History   No obstetric history on file.      Home Medications    Prior to Admission medications   Medication Sig Start Date End Date Taking? Authorizing Provider  albuterol (VENTOLIN HFA) 108 (90 Base) MCG/ACT inhaler Inhale 2 puffs into the lungs every 4 (four) hours as needed for wheezing or shortness of breath. 04/06/22  Yes Barrett Henle, MD  atorvastatin (LIPITOR) 20 MG tablet Take 20 mg by mouth every evening.   Yes [provider]  B Complex Vitamins  (VITAMIN-B COMPLEX) TABS Take 1 tablet by mouth daily.    Yes [provider]  benzonatate (TESSALON) 100 MG capsule Take 1 capsule (100 mg total) by mouth every 8 (eight) hours. 04/02/22  Yes Petrucelli, Glynda Jaeger, PA-C  Cholecalciferol 25 MCG (1000 UT) capsule Take 1,000 Units by mouth daily.    Yes [provider]  lisinopril-hydrochlorothiazide (ZESTORETIC) 20-12.5 MG tablet Take 1 tablet by mouth daily. 01/10/20  Yes Raylene Everts, MD  MAGNESIUM-CALCIUM-FOLIC ACID PO Take 1 tablet by mouth daily.   Yes [provider]  nirmatrelvir/ritonavir EUA (PAXLOVID) 20 x 150 MG & 10 x '100MG'$  TABS Take 3 tablets by mouth 2 (two) times daily for 5 days. Patient GFR is >60. Take nirmatrelvir (150 mg) two tablets twice daily for 5 days and ritonavir (100 mg) one tablet twice daily for 5 days. 04/02/22 04/07/22 Yes Petrucelli, Samantha R, PA-C  predniSONE (DELTASONE) 20 MG tablet Take 2 tablets (40 mg total) by mouth daily with breakfast for 3 days. 04/06/22 04/09/22 Yes Michaela Shankel, Gwenlyn Perking, MD  acetaminophen (TYLENOL) 500 MG tablet Take 1,000 mg by mouth every 6 (six) hours as needed for mild pain.    [provider]  esomeprazole (NEXIUM) 40 MG capsule TAKE 1 CAPSULE BY MOUTH DAILY AT 12 NOON Patient taking differently: Take  40 mg by mouth daily. 10/03/20   Mauri Pole, MD  ibuprofen (ADVIL) 200 MG tablet Take 400 mg by mouth every 6 (six) hours as needed for mild pain.    [provider]    Family History Family History  Problem Relation Age of Onset   Alcoholism Mother    Lung disease Maternal Grandmother    Diabetes Maternal Uncle    Colon cancer Neg Hx    Stomach cancer Neg Hx     Social History Social History   Tobacco Use   Smoking status: Never   Smokeless tobacco: Never  Vaping Use   Vaping Use: Never used  Substance Use Topics   Alcohol use: No    Alcohol/week: 0.0 standard drinks of alcohol   Drug use: No     Allergies    Iodinated contrast media   Review of Systems Review of Systems   Physical Exam Triage Vital Signs ED Triage Vitals  Enc Vitals Group     BP 04/06/22 0905 139/67     Pulse Rate 04/06/22 0905 69     Resp 04/06/22 0905 16     Temp 04/06/22 0905 98.2 F (36.8 C)     Temp Source 04/06/22 0905 Oral     SpO2 04/06/22 0905 98 %     Weight 04/06/22 0908 210 lb 15.7 oz (95.7 kg)     Height 04/06/22 0908 '5\' 5"'$  (1.651 m)     Head Circumference --      Peak Flow --      Pain Score 04/06/22 0908 0     Pain Loc --      Pain Edu? --      Excl. in [place]? --    No data found.  Updated Vital Signs BP 139/67 (BP Location: Right Arm)   Pulse 69   Temp 98.2 F (36.8 C) (Oral)   Resp 16   Ht '5\' 5"'$  (1.651 m)   Wt 95.7 kg   SpO2 98%   BMI 35.11 kg/m   Visual Acuity Right Eye Distance:   Left Eye Distance:   Bilateral Distance:    Right Eye Near:   Left Eye Near:    Bilateral Near:     Physical Exam Vitals reviewed.  Constitutional:      General: She is not in acute distress.    Appearance: She is not ill-appearing, toxic-appearing or diaphoretic.  HENT:     Nose: Congestion present.     Mouth/Throat:     Mouth: Mucous membranes are moist.     Comments: There is some white mucus draining in the oropharynx.  No erythema Eyes:     Extraocular Movements: Extraocular movements intact.     Conjunctiva/sclera: Conjunctivae normal.     Pupils: Pupils are equal, round, and reactive to light.  Cardiovascular:     Rate and Rhythm: Normal rate and regular rhythm.     Heart sounds: No murmur heard. Pulmonary:     Effort: Pulmonary effort is normal. No respiratory distress.     Breath sounds: Normal breath sounds. No stridor. No wheezing, rhonchi or rales.  Musculoskeletal:     Cervical back: Neck supple.     Right lower leg: No edema.     Left lower leg: No edema.  Lymphadenopathy:     Cervical: No cervical adenopathy.  Skin:    Capillary Refill: Capillary refill takes less than  2 seconds.     Coloration: Skin is not jaundiced or pale.  Neurological:     General: No focal deficit present.     Mental Status: She is alert and oriented to person, place, and time.  Psychiatric:        Behavior: Behavior normal.      UC Treatments / Results  Labs (all labs ordered are listed, but only abnormal results are displayed) Labs Reviewed - No data to display  EKG   Radiology No results found.  Procedures Procedures (including critical care time)  Medications Ordered in UC Medications - No data to display  Initial Impression / Assessment and Plan / UC Course  I have reviewed the triage vital signs and the nursing notes.  Pertinent labs & imaging results that were available during my care of the patient were reviewed by me and considered in my medical decision making (see chart for details).     She overall is improved compared to when she first started her symptoms.  She does not have any double sickening nor any feeling of chest congestion.  Since she does have a feeling of maybe feeling a little tight when she coughs like wheezing, I am going to send in 3 days of prednisone and inhaler.  We discussed that I cannot tell she needs antibiotics at this point.  I am still not in a do a chest x-ray today with a clear lung exam and no symptoms indicating pneumonia. Final Clinical Impressions(s) / UC Diagnoses   Final diagnoses:  COVID     Discharge Instructions      Albuterol inhaler--do 2 puffs every 4 hours as needed for shortness of breath or wheezing  Prednisone 20 mg--2 daily for 3 days.  This is for potential inflammation in your small airways       ED Prescriptions     Medication Sig Dispense Auth. Provider   albuterol (VENTOLIN HFA) 108 (90 Base) MCG/ACT inhaler Inhale 2 puffs into the lungs every 4 (four) hours as needed for wheezing or shortness of breath. 1 each Barrett Henle, MD   predniSONE (DELTASONE) 20 MG tablet Take 2 tablets (40  mg total) by mouth daily with breakfast for 3 days. 6 tablet Windy Carina Gwenlyn Perking, MD      PDMP not reviewed this encounter.   Barrett Henle, MD 04/06/22 [phone]

## 2022-04-06 NOTE — Discharge Instructions (Addendum)
Albuterol inhaler--do 2 puffs every 4 hours as needed for shortness of breath or wheezing  Prednisone 20 mg--2 daily for 3 days.  This is for potential inflammation in your small airways

## 2022-04-06 NOTE — ED Triage Notes (Signed)
Patient went to the emergency room Sunday. Patient was diagnosed with COVID-19.   Patient states she is still not feeling well but was supposed to return to work. Patient still having cough and congestion.   Patient states she took 2 and a half packs of the Covid medication but stopped because it made her feel bad.

## 2022-04-07 ENCOUNTER — Ambulatory Visit (HOSPITAL_COMMUNITY)
Admission: EM | Admit: 2022-04-07 | Discharge: 2022-04-07 | Disposition: A | Discharge status: Home / Self Care | Payer: Medicare Other

## 2022-04-07 ENCOUNTER — Encounter (HOSPITAL_COMMUNITY): Payer: Self-pay

## 2022-04-07 DIAGNOSIS — T887XXA Unspecified adverse effect of drug or medicament, initial encounter: Secondary | ICD-10-CM | POA: Diagnosis not present

## 2022-04-07 DIAGNOSIS — R002 Palpitations: Secondary | ICD-10-CM

## 2022-04-07 NOTE — ED Triage Notes (Signed)
Possible allergic reaction to prednisone as per pt states she has been having a rapid heart beat after taking medication, pt denies pressure near chest

## 2022-04-07 NOTE — Discharge Instructions (Signed)
Your lungs and heart sound great in the clinic.  You may continue taking prednisone 1 pill in the morning with breakfast and 1 pill in the evening with dinner.  You may continue albuterol inhaler for shortness of breath and wheeze as needed.  If you develop any new or worsening symptoms or do not improve in the next 2 to 3 days, please return.  If your symptoms are severe, please go to the emergency room.  Follow-up with your primary care provider for further evaluation and management of your symptoms as well as ongoing wellness visits.  I hope you feel better!

## 2022-04-07 NOTE — ED Provider Notes (Signed)
[place]    CSN: [id number] Arrival date & time: 04/07/22  1517      History   Chief Complaint Chief Complaint  Patient presents with   Tachycardia    Pt was seen yesterday and was prescribed prednisone 40 mg pt states she has been having rapid heart rate    HPI Jane [name] is a 66 y.o. female.   Patient presents to urgent care for evaluation of heart palpitations after taking her first dose of 40 mg of prednisone at 2:30 in the morning and using her albuterol inhaler at the same time this morning.  Patient states that she woke up at around 4 AM with heart palpitations that went away quickly without intervention.  Denies chest pain, shortness of breath, nausea, vomiting, weakness, headache, fever/chills, left arm pain, diaphoresis, dizziness, and abdominal discomfort.  States that she has taken steroids in the past for her arthritis to her feet.  She has never had heart palpitations after taking steroid medicine in the past.  She has not had heart palpitations since waking up at 4 AM after using the albuterol inhaler and denies heart palpitations at this time.  Patient is currently taking Paxlovid since she was diagnosed with COVID-19 on April 01, 2022 in the emergency department.  Cannot identify any other triggering or relieving factors for her symptoms at this time.  She is without pain currently.     Past Medical History:  Diagnosis Date   Acid reflux    Arthritis    Hypertension    Reflux    Vasculitis [facility])     Patient Active Problem List   Diagnosis Date Noted   Nausea and vomiting 11/01/2021   Gastroesophageal reflux disease 01/29/2018   Abdominal pain, epigastric 01/29/2018   Plantar fasciitis, bilateral 02/29/2016   Metatarsal deformity 02/29/2016   Tenosynovitis of foot 02/29/2016    Past Surgical History:  Procedure Laterality Date   COLONOSCOPY     TUBAL LIGATION      OB History   No obstetric history on file.      Home Medications     Prior to Admission medications   Medication Sig Start Date End Date Taking? Authorizing Provider  acetaminophen (TYLENOL) 500 MG tablet Take 1,000 mg by mouth every 6 (six) hours as needed for mild pain.    [provider]  albuterol (VENTOLIN HFA) 108 (90 Base) MCG/ACT inhaler Inhale 2 puffs into the lungs every 4 (four) hours as needed for wheezing or shortness of breath. 04/06/22   Barrett Henle, MD  atorvastatin (LIPITOR) 20 MG tablet Take 20 mg by mouth every evening.    [provider]  B Complex Vitamins (VITAMIN-B COMPLEX) TABS Take 1 tablet by mouth daily.     [provider]  benzonatate (TESSALON) 100 MG capsule Take 1 capsule (100 mg total) by mouth every 8 (eight) hours. 04/02/22   Petrucelli, Glynda Jaeger, PA-C  Cholecalciferol 25 MCG (1000 UT) capsule Take 1,000 Units by mouth daily.     [provider]  esomeprazole (NEXIUM) 40 MG capsule TAKE 1 CAPSULE BY MOUTH DAILY AT 12 NOON Patient taking differently: Take 40 mg by mouth daily. 10/03/20   Mauri Pole, MD  ibuprofen (ADVIL) 200 MG tablet Take 400 mg by mouth every 6 (six) hours as needed for mild pain.    [provider]  lisinopril-hydrochlorothiazide (ZESTORETIC) 20-12.5 MG tablet Take 1 tablet by mouth daily. 01/10/20   Raylene Everts, MD  MAGNESIUM-CALCIUM-FOLIC ACID PO Take 1 tablet by mouth daily.    [provider]  nirmatrelvir/ritonavir EUA (PAXLOVID) 20 x 150 MG & 10 x '100MG'$  TABS Take 3 tablets by mouth 2 (two) times daily for 5 days. Patient GFR is >60. Take nirmatrelvir (150 mg) two tablets twice daily for 5 days and ritonavir (100 mg) one tablet twice daily for 5 days. 04/02/22 04/07/22  Petrucelli, Aldona Bar R, PA-C  predniSONE (DELTASONE) 20 MG tablet Take 2 tablets (40 mg total) by mouth daily with breakfast for 3 days. 04/06/22 04/09/22  Barrett Henle, MD    Family History Family History  Problem Relation Age of Onset   Alcoholism Mother     Lung disease Maternal Grandmother    Diabetes Maternal Uncle    Colon cancer Neg Hx    Stomach cancer Neg Hx     Social History Social History   Tobacco Use   Smoking status: Never   Smokeless tobacco: Never  Vaping Use   Vaping Use: Never used  Substance Use Topics   Alcohol use: No    Alcohol/week: 0.0 standard drinks of alcohol   Drug use: No     Allergies   Iodinated contrast media   Review of Systems Review of Systems Per HPI  Physical Exam Triage Vital Signs ED Triage Vitals  Enc Vitals Group     BP 04/07/22 1703 (!) 121/95     Pulse Rate 04/07/22 1703 75     Resp 04/07/22 1703 12     Temp --      Temp src --      SpO2 04/07/22 1703 100 %     Weight 04/07/22 1700 209 lb (94.8 kg)     Height 04/07/22 1700 '5\' 5"'$  (1.651 m)     Head Circumference --      Peak Flow --      Pain Score 04/07/22 1700 0     Pain Loc --      Pain Edu? --      Excl. in [place]? --    No data found.  Updated Vital Signs BP (!) 121/95 (BP Location: Left Arm)   Pulse 75   Resp 12   Ht '5\' 5"'$  (1.651 m)   Wt 209 lb (94.8 kg)   SpO2 100%   BMI 34.78 kg/m   Visual Acuity Right Eye Distance:   Left Eye Distance:   Bilateral Distance:    Right Eye Near:   Left Eye Near:    Bilateral Near:     Physical Exam Vitals and nursing note reviewed.  Constitutional:      Appearance: Normal appearance. She is not ill-appearing or toxic-appearing.     Comments: Very pleasant patient sitting on exam in position of comfort table in no acute distress.   HENT:     Head: Normocephalic and atraumatic.     Right Ear: Hearing and external ear normal.     Left Ear: Hearing and external ear normal.     Nose: Nose normal.     Mouth/Throat:     Lips: Pink.     Mouth: Mucous membranes are moist.  Eyes:     General: Lids are normal. Vision grossly intact. Gaze aligned appropriately.     Extraocular Movements: Extraocular movements intact.     Conjunctiva/sclera: Conjunctivae normal.   Cardiovascular:     Rate and Rhythm: Normal rate and regular rhythm.     Heart sounds: Normal heart sounds, S1 normal and S2  normal.  Pulmonary:     Effort: Pulmonary effort is normal. No respiratory distress.     Breath sounds: Normal breath sounds and air entry.  Abdominal:     Palpations: Abdomen is soft.  Musculoskeletal:     Cervical back: Neck supple.  Skin:    General: Skin is warm and dry.     Capillary Refill: Capillary refill takes less than 2 seconds.     Findings: No rash.  Neurological:     General: No focal deficit present.     Mental Status: She is alert and oriented to person, place, and time. Mental status is at baseline.     Cranial Nerves: No dysarthria or facial asymmetry.     Gait: Gait is intact.  Psychiatric:        Mood and Affect: Mood normal.        Speech: Speech normal.        Behavior: Behavior normal.        Thought Content: Thought content normal.        Judgment: Judgment normal.      UC Treatments / Results  Labs (all labs ordered are listed, but only abnormal results are displayed) Labs Reviewed - No data to display  EKG   Radiology No results found.  Procedures Procedures (including critical care time)  Medications Ordered in UC Medications - No data to display  Initial Impression / Assessment and Plan / UC Course  I have reviewed the triage vital signs and the nursing notes.  Pertinent labs & imaging results that were available during my care of the patient were reviewed by me and considered in my medical decision making (see chart for details).     *** Final Clinical Impressions(s) / UC Diagnoses   Final diagnoses:  Medication side effect  Palpitations     Discharge Instructions      Your lungs and heart sound great in the clinic.  You may continue taking prednisone 1 pill in the morning with breakfast and 1 pill in the evening with dinner.  You may continue albuterol inhaler for shortness of breath and wheeze  as needed.  If you develop any new or worsening symptoms or do not improve in the next 2 to 3 days, please return.  If your symptoms are severe, please go to the emergency room.  Follow-up with your primary care provider for further evaluation and management of your symptoms as well as ongoing wellness visits.  I hope you feel better!     ED Prescriptions   None    PDMP not reviewed this encounter.

## 2022-04-11 ENCOUNTER — Encounter (HOSPITAL_COMMUNITY): Payer: Self-pay | Admitting: Emergency Medicine

## 2022-04-11 ENCOUNTER — Other Ambulatory Visit: Payer: Self-pay

## 2022-04-11 ENCOUNTER — Emergency Department (HOSPITAL_COMMUNITY)
Admission: EM | Admit: 2022-04-11 | Discharge: 2022-04-11 | Disposition: A | Discharge status: Home / Self Care | Payer: Medicare Other | Attending: Emergency Medicine | Admitting: Emergency Medicine

## 2022-04-11 DIAGNOSIS — I10 Essential (primary) hypertension: Secondary | ICD-10-CM | POA: Insufficient documentation

## 2022-04-11 DIAGNOSIS — R059 Cough, unspecified: Secondary | ICD-10-CM | POA: Diagnosis not present

## 2022-04-11 DIAGNOSIS — Z79899 Other long term (current) drug therapy: Secondary | ICD-10-CM | POA: Insufficient documentation

## 2022-04-11 NOTE — ED Triage Notes (Addendum)
Patient here with complaint of hypertension despite taking antihypertensive medication since 8/13 after starting prednisone for a COVID infection. Patient denies chest pain, is alert, oriented, ambulatory, and in no apparent distress at this time.  BP 137/94 in triage.

## 2022-04-11 NOTE — Discharge Instructions (Signed)
Today you are seen in the emergency department for your high blood pressure at home.  Your blood pressure in the emergency department was 137/94.  Please stop taking the prednisone at this time as it does not appear that you are having an asthma exacerbation or COPD exacerbation and this can elevate your blood pressure.  Follow-up with your primary doctor in several days regarding your blood pressure medication.  Return immediately to the emergency department if you experience any of the following: Severe headache, numbness or weakness of your arms or legs, slurred speech, shortness of breath, chest pain, or any other concerning symptoms.

## 2022-04-11 NOTE — ED Provider Notes (Signed)
[facility] EMERGENCY DEPARTMENT Provider Note   CSN: [id number] Arrival date & time: 04/11/22  2130     History  Chief Complaint  Patient presents with   Hypertension    Jane [name] is a 66 y.o. female.  66 year old female with a history of hypertension who presents to the emergency department with high blood pressure at home.  Patient states that she was recently diagnosed with COVID and has had a mild cough.  Says that she was put on prednisone and albuterol for this but does not have any history of COPD or asthma.  Says that after taking the prednisone she noticed that her blood pressure was elevated.  Says that last night she could not sleep and took her blood pressure and it was approximately 865 systolic and she was concerned so she came into the emergency department.  She denies any headaches, slurred speech, numbness or weakness of her arms or legs, shortness of breath, or chest pain.  No lower extremity swelling.  Says that she takes HCTZ and lisinopril combined for her blood pressure.  Reports that aside from the cough she is otherwise doing well.  Was unable to see her PCP so came into the emergency department today.   Hypertension       Home Medications Prior to Admission medications   Medication Sig Start Date End Date Taking? Authorizing Provider  acetaminophen (TYLENOL) 500 MG tablet Take 1,000 mg by mouth every 6 (six) hours as needed for mild pain.    [provider]  albuterol (VENTOLIN HFA) 108 (90 Base) MCG/ACT inhaler Inhale 2 puffs into the lungs every 4 (four) hours as needed for wheezing or shortness of breath. 04/06/22   Barrett Henle, MD  atorvastatin (LIPITOR) 20 MG tablet Take 20 mg by mouth every evening.    [provider]  B Complex Vitamins (VITAMIN-B COMPLEX) TABS Take 1 tablet by mouth daily.     [provider]  benzonatate (TESSALON) 100 MG capsule Take 1 capsule (100 mg total) by mouth every 8  (eight) hours. 04/02/22   Petrucelli, Glynda Jaeger, PA-C  Cholecalciferol 25 MCG (1000 UT) capsule Take 1,000 Units by mouth daily.     [provider]  esomeprazole (NEXIUM) 40 MG capsule TAKE 1 CAPSULE BY MOUTH DAILY AT 12 NOON Patient taking differently: Take 40 mg by mouth daily. 10/03/20   Mauri Pole, MD  ibuprofen (ADVIL) 200 MG tablet Take 400 mg by mouth every 6 (six) hours as needed for mild pain.    [provider]  lisinopril-hydrochlorothiazide (ZESTORETIC) 20-12.5 MG tablet Take 1 tablet by mouth daily. 01/10/20   Raylene Everts, MD  MAGNESIUM-CALCIUM-FOLIC ACID PO Take 1 tablet by mouth daily.    [provider]      Allergies    Iodinated contrast media    Review of Systems   Review of Systems  Physical Exam Updated Vital Signs BP (!) 137/94 (BP Location: Right Arm)   Pulse 73   Temp 97.9 F (36.6 C) (Oral)   Resp 15   SpO2 98%  Physical Exam Vitals and nursing note reviewed.  Constitutional:      General: She is not in acute distress.    Appearance: She is well-developed.  HENT:     Head: Normocephalic and atraumatic.     Right Ear: External ear normal.     Left Ear: External ear normal.     Nose: Nose normal.  Eyes:  Extraocular Movements: Extraocular movements intact.     Conjunctiva/sclera: Conjunctivae normal.     Pupils: Pupils are equal, round, and reactive to light.  Cardiovascular:     Rate and Rhythm: Normal rate and regular rhythm.     Heart sounds: No murmur heard. Pulmonary:     Effort: Pulmonary effort is normal. No respiratory distress.     Breath sounds: Normal breath sounds.  Abdominal:     General: Abdomen is flat. There is no distension.     Palpations: Abdomen is soft. There is no mass.     Tenderness: There is no abdominal tenderness. There is no guarding.  Musculoskeletal:        General: No swelling.     Cervical back: Normal range of motion and neck supple.     Right lower leg: No edema.      Left lower leg: No edema.  Skin:    General: Skin is warm and dry.     Capillary Refill: Capillary refill takes less than 2 seconds.  Neurological:     General: No focal deficit present.     Mental Status: She is alert and oriented to person, place, and time. Mental status is at baseline.     Comments: Cranial nerves grossly intact.  Moving all 4 extremities equally.  Psychiatric:        Mood and Affect: Mood normal.     ED Results / Procedures / Treatments   Labs (all labs ordered are listed, but only abnormal results are displayed) Labs Reviewed - No data to display  EKG None  Radiology No results found.  Procedures Procedures   Medications Ordered in ED Medications - No data to display  ED Course/ Medical Decision Making/ A&P                           Medical Decision Making  Jane [name] is a 66 y.o. female with history of hypertension who presents with chief complaint of elevated blood pressure at home.  Initial DDx: Hypertensive emergency, hypertension due to discomfort or anxiety, hypertension due to prednisone Patient does not have any signs or symptoms of hypertensive emergency on exam.  This is likely asymptomatic hypertension due to either the anxiety of not being able to sleep last night or as a side effect of prednisone.  Plan:  Discontinue prednisone  ED Summary:  We will have the patient discontinue her prednisone and follow-up with her primary doctor as it does not appear that she is having an asthma exacerbation at this time or COPD exacerbation and may be causing her blood pressure to be elevated.  We will have the patient follow-up with her primary doctor in 2 to 3 days.  Return precautions discussed with the patient prior to discharge as well as counseling on how to take her blood pressure at home.   Records reviewed Care Everywhere/External Records  Final Clinical Impression(s) / ED Diagnoses Final diagnoses:  Hypertension, unspecified type     Rx / DC Orders ED Discharge Orders     None         Fransico Meadow, MD 04/11/22 [phone]

## 2022-04-22 ENCOUNTER — Encounter (HOSPITAL_COMMUNITY): Payer: Self-pay

## 2022-04-22 ENCOUNTER — Ambulatory Visit (HOSPITAL_COMMUNITY)
Admission: EM | Admit: 2022-04-22 | Discharge: 2022-04-22 | Disposition: A | Discharge status: Home / Self Care | Payer: Medicare Other | Attending: Emergency Medicine | Admitting: Emergency Medicine

## 2022-04-22 DIAGNOSIS — M25561 Pain in right knee: Secondary | ICD-10-CM

## 2022-04-22 MED ORDER — PREDNISONE 10 MG (21) PO TBPK: ORAL_TABLET | Freq: Every day | ORAL | 0 refills | Status: DC

## 2022-04-22 NOTE — ED Triage Notes (Signed)
Patient having right knee pain x 1 month. No injuries or falls. Patient set to see specialty 04/27/22 but states the pain is worse.  Patient states x-rays were taken and there cartilage loss.

## 2022-04-22 NOTE — Discharge Instructions (Signed)
Your pain is most likely caused by irritation to the muscles.   Take prednisone as recommended on packaging to reduce inflammation that may be causing your pain, may use Tylenol in addition as well as topical products, avoid use of ibuprofen  You may use heating pad in 15 minute intervals as needed for additional comfort,or you may find comfort in using ice in 10-15 minutes over affected area  Begin stretching or massage affected area daily for 10 minutes as tolerated to further loosen muscles   When lying down place pillow underneath and between knees for support  Continue use of knee sleeve when standing and walking for stability and support  Keep upcoming appointment with orthopedic specialist

## 2022-04-22 NOTE — ED Provider Notes (Signed)
[facility]    CSN: [id number] Arrival date & time: 04/22/22  1004      History   Chief Complaint Chief Complaint  Patient presents with   Knee Pain    HPI Jane [name] is a 66 y.o. female.   Patient presents with persisting right knee pain for 1 month.  Endorses that symptoms worsen over the last few days causing pain when bearing weight and her to limp when walking.  Endorses that she is twisted the area at least twice with last occurrence being 1 month ago.  Is currently being followed by orthopedics and has upcoming appointment in 5 days.  Denies numbness, tingling, new injuries or trauma.  Endorses that she had x-rays completed which showed loose cartilage .  Has attempted use of Tylenol and ibuprofen which have been somewhat helpful, have been using topical lidocaine as well as a knee sleeve for support.  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      Home Medications    Prior to Admission medications   Medication Sig Start Date End Date Taking? Authorizing Provider  acetaminophen (TYLENOL) 500 MG tablet Take 1,000 mg by mouth every 6 (six) hours as needed for mild pain.   Yes [provider]  atorvastatin (LIPITOR) 20 MG tablet Take 20 mg by mouth every evening.   Yes [provider]  B Complex Vitamins (VITAMIN-B COMPLEX) TABS Take 1 tablet by mouth daily.    Yes [provider]  Cholecalciferol 25 MCG (1000 UT) capsule Take 1,000 Units  by mouth daily.    Yes [provider]  ibuprofen (ADVIL) 200 MG tablet Take 400 mg by mouth every 6 (six) hours as needed for mild pain.   Yes [provider]  lisinopril-hydrochlorothiazide (ZESTORETIC) 20-12.5 MG tablet Take 1 tablet by mouth daily. 01/10/20  Yes Raylene Everts, MD  MAGNESIUM-CALCIUM-FOLIC ACID PO Take 1 tablet by mouth daily.   Yes [provider]  albuterol (VENTOLIN HFA) 108 (90 Base) MCG/ACT inhaler Inhale 2 puffs into the lungs every 4 (four) hours as needed for wheezing or shortness of breath. 04/06/22   Barrett Henle, MD  benzonatate (TESSALON) 100 MG capsule Take 1 capsule (100 mg total) by mouth every 8 (eight) hours. 04/02/22   Petrucelli, Glynda Jaeger, PA-C    Family History Family History  Problem Relation Age of Onset   Alcoholism Mother    Lung disease Maternal Grandmother    Diabetes Maternal Uncle    Colon cancer Neg Hx    Stomach cancer Neg Hx     Social History Social History   Tobacco Use   Smoking status: Never   Smokeless tobacco: Never  Vaping Use   Vaping Use: Never used  Substance Use Topics   Alcohol use: No    Alcohol/week: 0.0 standard drinks of alcohol   Drug use: No     Allergies  Iodinated contrast media and Shellfish allergy   Review of Systems Review of Systems  Constitutional: Negative.   Respiratory: Negative.    Cardiovascular: Negative.   Musculoskeletal:  Positive for myalgias. Negative for arthralgias, back pain, gait problem, joint swelling, neck pain and neck stiffness.  Skin: Negative.   Neurological: Negative.      Physical Exam Triage Vital Signs ED Triage Vitals  Enc Vitals Group     BP 04/22/22 1021 (!) 158/86     Pulse Rate 04/22/22 1021 (!) 58     Resp 04/22/22 1021 16     Temp 04/22/22 1021 98.3 F (36.8 C)     Temp Source 04/22/22 1021 Oral     SpO2 04/22/22 1021 97 %     Weight 04/22/22 1023 209 lb (94.8 kg)     Height 04/22/22 1023 '5\' 5"'$  (1.651 m)      Head Circumference --      Peak Flow --      Pain Score 04/22/22 1022 8     Pain Loc --      Pain Edu? --      Excl. in [place]? --    No data found.  Updated Vital Signs BP (!) 158/86 (BP Location: Left Arm)   Pulse (!) 58   Temp 98.3 F (36.8 C) (Oral)   Resp 16   Ht '5\' 5"'$  (1.651 m)   Wt 209 lb (94.8 kg)   SpO2 97%   BMI 34.78 kg/m   Visual Acuity Right Eye Distance:   Left Eye Distance:   Bilateral Distance:    Right Eye Near:   Left Eye Near:    Bilateral Near:     Physical Exam Constitutional:      Appearance: Normal appearance.  HENT:     Head: Normocephalic.  Eyes:     Extraocular Movements: Extraocular movements intact.  Pulmonary:     Effort: Pulmonary effort is normal.  Musculoskeletal:     Comments: Tenderness is present over the medial aspect of the right knee without ecchymosis, swelling or deformity, no signs of effusion, able to bear weight but elicits pain, range of motion intact, 2+ popliteal pulse, no ligament laxity noted  Neurological:     Mental Status: She is alert and oriented to person, place, and time. Mental status is at baseline.  Psychiatric:        Mood and Affect: Mood normal.        Behavior: Behavior normal.      UC Treatments / Results  Labs (all labs ordered are listed, but only abnormal results are displayed) Labs Reviewed - No data to display  EKG   Radiology No results found.  Procedures Procedures (including critical care time)  Medications Ordered in UC Medications - No data to display  Initial Impression / Assessment and Plan / UC Course  I have reviewed the triage vital signs and the nursing notes.  Pertinent labs & imaging results that were available during my care of the patient were reviewed by me and considered in my medical decision making (see chart for details).  Acute right knee pain  Will defer imaging as x-rays have already been completed and noted injury has occurred, discussed with patient,  patient endorses success with steroid in the past however did have an occurrence of palpitations with use of medicine while having COVID, prednisone taper prescribed and advised if she begins to have palpitations to immediately stop use, may continue use of additional supportive measures  and advised to keep upcoming appointment with specialist Final Clinical Impressions(s) / UC Diagnoses   Final diagnoses:  None   Discharge Instructions   None    ED Prescriptions   None    PDMP not reviewed this encounter.   Hans Eden, NP 04/22/22 1059

## 2022-06-14 DIAGNOSIS — K449 Diaphragmatic hernia without obstruction or gangrene: Secondary | ICD-10-CM | POA: Insufficient documentation

## 2022-06-26 DIAGNOSIS — Z86010 Personal history of colonic polyps: Secondary | ICD-10-CM | POA: Insufficient documentation

## 2022-06-26 DIAGNOSIS — K5790 Diverticulosis of intestine, part unspecified, without perforation or abscess without bleeding: Secondary | ICD-10-CM | POA: Insufficient documentation

## 2022-06-26 DIAGNOSIS — K648 Other hemorrhoids: Secondary | ICD-10-CM | POA: Insufficient documentation

## 2022-07-09 ENCOUNTER — Emergency Department (HOSPITAL_COMMUNITY): Payer: Medicare Other

## 2022-07-09 ENCOUNTER — Emergency Department (HOSPITAL_COMMUNITY)
Admission: EM | Admit: 2022-07-09 | Discharge: 2022-07-09 | Disposition: A | Discharge status: Home / Self Care | Payer: Medicare Other | Attending: Emergency Medicine | Admitting: Emergency Medicine

## 2022-07-09 ENCOUNTER — Other Ambulatory Visit: Payer: Self-pay

## 2022-07-09 DIAGNOSIS — Z79899 Other long term (current) drug therapy: Secondary | ICD-10-CM | POA: Diagnosis not present

## 2022-07-09 DIAGNOSIS — Z1152 Encounter for screening for COVID-19: Secondary | ICD-10-CM | POA: Insufficient documentation

## 2022-07-09 DIAGNOSIS — R0981 Nasal congestion: Secondary | ICD-10-CM | POA: Diagnosis not present

## 2022-07-09 DIAGNOSIS — R051 Acute cough: Secondary | ICD-10-CM | POA: Diagnosis not present

## 2022-07-09 DIAGNOSIS — I10 Essential (primary) hypertension: Secondary | ICD-10-CM | POA: Diagnosis not present

## 2022-07-09 DIAGNOSIS — R059 Cough, unspecified: Secondary | ICD-10-CM | POA: Diagnosis present

## 2022-07-09 LAB — RESP PANEL BY RT-PCR (FLU A&B, COVID) ARPGX2
Influenza A by PCR: NEGATIVE
Influenza B by PCR: NEGATIVE
SARS Coronavirus 2 by RT PCR: NEGATIVE

## 2022-07-09 NOTE — ED Notes (Signed)
Patient verbalizes understanding of discharge instructions. Opportunity for questioning and answers were provided. Pt discharged from ED. 

## 2022-07-09 NOTE — ED Provider Triage Note (Signed)
Emergency Medicine Provider Triage Evaluation Note  Jane [name] , a 66 y.o. female  was evaluated in triage.  Pt complains of rhinorrhea, postnasal drip and cough x3 days.  She reports a scratchy sensation in her throat which is mild only occurs with cough.  She describes yellow and white sputum production  Review of Systems  Positive: Sore throat, rhinorrhea, postnasal drip, cough Negative: Pleurisy, chest pain, abdominal pain, nausea, vomiting, hemoptysis, extremity swelling/color change, history of blood clot or any additional concerns  Physical Exam  BP (!) 168/94 (BP Location: Right Arm)   Pulse 76   Temp 98.2 F (36.8 C)   Resp 17   Ht 5' 4.5" (1.638 m)   Wt 94.8 kg   SpO2 100%   BMI 35.32 kg/m  Gen:   Awake, no distress   Resp:  Normal effort  MSK:   Moves extremities without difficulty  Other:  Lungs clear to auscultation.  Heart regular rate and rhythm.  Airway clear without swelling.  Mild posterior pharynx cobblestoning.  Mild rhinorrhea.  Medical Decision Making  Medically screening exam initiated at 11:08 AM.  Appropriate orders placed.  Taunia Frasco was informed that the remainder of the evaluation will be completed by another provider, this initial triage assessment does not replace that evaluation, and the importance of remaining in the ED until their evaluation is complete.  Note: Portions of this report may have been transcribed using voice recognition software. Every effort was made to ensure accuracy; however, inadvertent computerized transcription errors may still be present.    Deliah Boston, PA-C 07/09/22 1113

## 2022-07-09 NOTE — Discharge Instructions (Signed)
Your COVID and flu swab are negative.  As we discussed, you can try Flonase (or any generic brand of this nasal spray).  This is a steroid for your nose.  You take ibuprofen and Tylenol as needed for discomfort.  Try to drink plenty of fluids to stay hydrated.  Follow-up with your primary care provider.  If you have any chest pain, shortness of breath, or any other reason the bleed you are having a medical emergency, please call 911 or return to the emergency department.

## 2022-07-09 NOTE — ED Provider Notes (Signed)
[facility] EMERGENCY DEPARTMENT Provider Note   CSN: [id number] Arrival date & time: 07/09/22  1043     History  Chief Complaint  Patient presents with   Sore Throat   Nasal Congestion   Cough    Jane [name] is a 66 y.o. female.  Patient is a 66 year old female with past medical history significant for hypertension presenting with concerns for sore throat, nasal congestion, nasal drainage, cough for the past 2 days.  She reports that she has been taking her allergy medicine and has not noticed much improvement.  She states that she is able to swallow food and liquids, has had intermittent cough, and has had a runny nose.  She is unsure if she has been exposed anyone who is sick.  She denies chest pain, shortness of breath, fevers, nausea, vomiting, diarrhea.  She notes that she gets very nervous coming to the hospital and that she often has high blood pressures while she is here in the hospital.        Home Medications Prior to Admission medications   Medication Sig Start Date End Date Taking? Authorizing Provider  acetaminophen (TYLENOL) 500 MG tablet Take 1,000 mg by mouth every 6 (six) hours as needed for mild pain.    [provider]  albuterol (VENTOLIN HFA) 108 (90 Base) MCG/ACT inhaler Inhale 2 puffs into the lungs every 4 (four) hours as needed for wheezing or shortness of breath. 04/06/22   Barrett Henle, MD  atorvastatin (LIPITOR) 20 MG tablet Take 20 mg by mouth every evening.    [provider]  B Complex Vitamins (VITAMIN-B COMPLEX) TABS Take 1 tablet by mouth daily.     [provider]  benzonatate (TESSALON) 100 MG capsule Take 1 capsule (100 mg total) by mouth every 8 (eight) hours. 04/02/22   Petrucelli, Glynda Jaeger, PA-C  Cholecalciferol 25 MCG (1000 UT) capsule Take 1,000 Units by mouth daily.     [provider]  ibuprofen (ADVIL) 200 MG tablet Take 400 mg by mouth every 6 (six) hours as needed for mild  pain.    [provider]  lisinopril-hydrochlorothiazide (ZESTORETIC) 20-12.5 MG tablet Take 1 tablet by mouth daily. 01/10/20   Raylene Everts, MD  MAGNESIUM-CALCIUM-FOLIC ACID PO Take 1 tablet by mouth daily.    [provider]  predniSONE (STERAPRED UNI-PAK 21 TAB) 10 MG (21) TBPK tablet Take by mouth daily. Take 6 tabs by mouth daily  for 1 days, then 5 tabs for 1 days, then 4 tabs for 1 days, then 3 tabs for 1 days, 2 tabs for 1 days, then 1 tab by mouth daily for 1 days 04/22/22   Hans Eden, NP      Allergies    Iodinated contrast media and Shellfish allergy    Review of Systems   Review of Systems  Physical Exam Updated Vital Signs BP (!) 179/87   Pulse 71   Temp 98.4 F (36.9 C) (Oral)   Resp 18   Ht 5' 4.5" (1.638 m)   Wt 94.8 kg   SpO2 100%   BMI 35.32 kg/m  Physical Exam Vitals and nursing note reviewed.  Constitutional:      General: She is not in acute distress.    Appearance: She is well-developed.  HENT:     Head: Normocephalic and atraumatic.     Nose: Congestion present.     Mouth/Throat:     Mouth: Mucous membranes are moist.  Pharynx: Oropharynx is clear. No oropharyngeal exudate or posterior oropharyngeal erythema.  Eyes:     Conjunctiva/sclera: Conjunctivae normal.  Cardiovascular:     Rate and Rhythm: Normal rate and regular rhythm.     Heart sounds: No murmur heard.    Comments: During my evaluation, blood pressure 465 systolic, HR 84. Pulmonary:     Effort: Pulmonary effort is normal. No respiratory distress.     Breath sounds: Normal breath sounds.  Abdominal:     Palpations: Abdomen is soft.     Tenderness: There is no abdominal tenderness.  Musculoskeletal:        General: No swelling.     Cervical back: Neck supple.  Skin:    General: Skin is warm and dry.     Capillary Refill: Capillary refill takes less than 2 seconds.  Neurological:     Mental Status: She is alert.  Psychiatric:        Mood and  Affect: Mood normal.     ED Results / Procedures / Treatments   Labs (all labs ordered are listed, but only abnormal results are displayed) Labs Reviewed  RESP PANEL BY RT-PCR (FLU A&B, COVID) [id number]    EKG None  Radiology DG Chest 2 View  Result Date: 07/09/2022 CLINICAL DATA:  Cough EXAM: CHEST - 2 VIEW COMPARISON:  None Available. FINDINGS: No pleural effusion. No pneumothorax. Normal cardiac and mediastinal contours. No displaced rib fractures. Visualized upper abdomen is unremarkable. Degenerative changes of the bilateral AC joints. Vertebral body heights are maintained. IMPRESSION: No focal airspace opacity. Electronically Signed   By: Marin Roberts M.D.   On: 07/09/2022 12:07    Procedures Procedures    Medications Ordered in ED Medications - No data to display  ED Course/ Medical Decision Making/ A&P                           Medical Decision Making Patient's presentation is most consistent with cough from nasal drainage, possibly viral in etiology.  She is overall very well-appearing.  Her lungs are clear to auscultation bilaterally, she has normal work of breathing, oxygenating well.  Chest x-ray was obtained in triage.  Amount and/or Complexity of Data Reviewed Labs: ordered. Decision-making details documented in ED Course. Radiology: ordered and independent interpretation performed. Decision-making details documented in ED Course.   COVID and flu negative.  Chest x-ray showed no focal consolidations or opacifications concerning for pneumonia.  Patient is stable for discharge at this time.  Plan to follow-up with PCP as needed. We had an extensive conversation with the patient in regards to symptomatic management.  We discussed the use of nasal corticosteroid such as Flonase.  She expressed understanding.  We discussed strict return precautions such as shortness of breath, chest pain.  She does have elevated blood pressure here.  She stated that she often has  elevated blood pressure while being evaluated by a medical professional and that when she measures it at home it is improved.  She denies headache, vision changes.        Final Clinical Impression(s) / ED Diagnoses Final diagnoses:  Acute cough  Nasal congestion    Rx / DC Orders ED Discharge Orders     None         Luster Landsberg, MD 07/09/22 1715    Isla Pence, MD 07/10/22 [phone]

## 2022-07-09 NOTE — ED Triage Notes (Signed)
Pt. Stated, Im having a scratchy throat with nasal congestion and coughing up yellow stuff and white.

## 2022-08-08 ENCOUNTER — Ambulatory Visit: Payer: Medicare Other | Admitting: Nurse Practitioner

## 2022-10-07 DIAGNOSIS — M1711 Unilateral primary osteoarthritis, right knee: Secondary | ICD-10-CM | POA: Insufficient documentation

## 2022-10-14 ENCOUNTER — Encounter (HOSPITAL_COMMUNITY): Payer: Self-pay

## 2022-10-14 ENCOUNTER — Ambulatory Visit (HOSPITAL_COMMUNITY)
Admission: EM | Admit: 2022-10-14 | Discharge: 2022-10-14 | Disposition: A | Discharge status: Home / Self Care | Payer: Medicare Other | Attending: Family Medicine | Admitting: Family Medicine

## 2022-10-14 DIAGNOSIS — M545 Low back pain, unspecified: Secondary | ICD-10-CM | POA: Diagnosis present

## 2022-10-14 DIAGNOSIS — R1030 Lower abdominal pain, unspecified: Secondary | ICD-10-CM | POA: Diagnosis not present

## 2022-10-14 LAB — POCT URINALYSIS DIPSTICK, ED / UC
Bilirubin Urine: NEGATIVE
Glucose, UA: NEGATIVE mg/dL
Hgb urine dipstick: NEGATIVE
Ketones, ur: NEGATIVE mg/dL
Nitrite: NEGATIVE
Protein, ur: NEGATIVE mg/dL
Specific Gravity, Urine: 1.025 (ref 1.005–1.030)
Urobilinogen, UA: 0.2 mg/dL (ref 0.0–1.0)
pH: 5.5 (ref 5.0–8.0)

## 2022-10-14 NOTE — Discharge Instructions (Addendum)
The urinalysis showed a tiny amount of white blood cells in your urine.  We will send for culture, and staff will notify you if it looks like there is a urinary infection that needs antibiotics  For the constipation, you can get MiraLAX over-the-counter and use it daily.  Please follow-up with your primary care about your back pain, so they can see what imaging you might need.

## 2022-10-14 NOTE — ED Triage Notes (Signed)
Patient here today for lower abdominal pain X 2 days. Patient has tried taking some acid reflux medication with some relief.

## 2022-10-14 NOTE — ED Provider Notes (Signed)
[place]    CSN: [id number] Arrival date & time: 10/14/22  1658      History   Chief Complaint Chief Complaint  Patient presents with   Abdominal Pain    HPI Jane [name] is a 67 y.o. female.    Abdominal Pain  Here for lower abdominal pain and low back pain.  The lower abdominal pain is was in the suprapubic area about 3 days ago and is actually resolved right now.  She has some maybe pelvic pressure.  No dysuria or urinary frequency.  No fever or chills or nausea or vomiting or diarrhea.  She also notes some left buttock pain.  Ibuprofen helps it.  No recent injury or trauma.  She has talked to her primary care about some neck pain and got an x-ray order for a C-spine, but does not have an order for the L-spine.  She wonders if she can take her current order and still get back x-rays  She is having some constipation right now that she wonders if it is causing some of her symptoms.  Past Medical History:  Diagnosis Date   Acid reflux    Arthritis    Hypertension    Reflux    Vasculitis [facility])     Patient Active Problem List   Diagnosis Date Noted   Nausea and vomiting 11/01/2021   Gastroesophageal reflux disease 01/29/2018   Abdominal pain, epigastric 01/29/2018   Plantar fasciitis, bilateral 02/29/2016   Metatarsal deformity 02/29/2016   Tenosynovitis of foot 02/29/2016    Past Surgical History:  Procedure Laterality Date   COLONOSCOPY     TUBAL LIGATION      OB History   No obstetric history on file.      Home Medications    Prior to Admission medications   Medication Sig Start Date End Date Taking? Authorizing Provider  acetaminophen (TYLENOL) 500 MG tablet Take 1,000 mg by mouth every 6 (six) hours as needed for mild pain.   Yes [provider]  atorvastatin (LIPITOR) 20 MG tablet Take 20 mg by mouth every evening.   Yes [provider]  B Complex Vitamins (VITAMIN-B COMPLEX) TABS Take 1 tablet by mouth daily.    Yes  [provider]  Cholecalciferol 25 MCG (1000 UT) capsule Take 1,000 Units by mouth daily.    Yes [provider]  ibuprofen (ADVIL) 200 MG tablet Take 400 mg by mouth every 6 (six) hours as needed for mild pain.   Yes [provider]  lisinopril-hydrochlorothiazide (ZESTORETIC) 20-12.5 MG tablet Take 1 tablet by mouth daily. 01/10/20  Yes Raylene Everts, MD  MAGNESIUM-CALCIUM-FOLIC ACID PO Take 1 tablet by mouth daily.   Yes [provider]  albuterol (VENTOLIN HFA) 108 (90 Base) MCG/ACT inhaler Inhale 2 puffs into the lungs every 4 (four) hours as needed for wheezing or shortness of breath. 04/06/22   Barrett Henle, MD    Family History Family History  Problem Relation Age of Onset   Alcoholism Mother    Lung disease Maternal Grandmother    Diabetes Maternal Uncle    Colon cancer Neg Hx    Stomach cancer Neg Hx     Social History Social History   Tobacco Use   Smoking status: Never   Smokeless tobacco: Never  Vaping Use   Vaping Use: Never used  Substance Use Topics   Alcohol use: No    Alcohol/week: 0.0 standard drinks of alcohol   Drug use: No  Allergies   Iodinated contrast media and Shellfish allergy   Review of Systems Review of Systems  Gastrointestinal:  Positive for abdominal pain.     Physical Exam Triage Vital Signs ED Triage Vitals  Enc Vitals Group     BP 10/14/22 1751 (!) 167/80     Pulse Rate 10/14/22 1751 68     Resp 10/14/22 1751 16     Temp 10/14/22 1751 98.2 F (36.8 C)     Temp Source 10/14/22 1751 Oral     SpO2 10/14/22 1751 98 %     Weight 10/14/22 1748 205 lb (93 kg)     Height 10/14/22 1748 '5\' 5"'$  (1.651 m)     Head Circumference --      Peak Flow --      Pain Score 10/14/22 1747 6     Pain Loc --      Pain Edu? --      Excl. in [place]? --    No data found.  Updated Vital Signs BP (!) 167/80 (BP Location: Left Arm)   Pulse 68   Temp 98.2 F (36.8 C) (Oral)   Resp 16   Ht '5\' 5"'$   (1.651 m)   Wt 93 kg   SpO2 98%   BMI 34.11 kg/m   Visual Acuity Right Eye Distance:   Left Eye Distance:   Bilateral Distance:    Right Eye Near:   Left Eye Near:    Bilateral Near:     Physical Exam Vitals reviewed.  Constitutional:      Appearance: She is not ill-appearing, toxic-appearing or diaphoretic.  HENT:     Mouth/Throat:     Mouth: Mucous membranes are moist.  Eyes:     Extraocular Movements: Extraocular movements intact.     Conjunctiva/sclera: Conjunctivae normal.     Pupils: Pupils are equal, round, and reactive to light.  Cardiovascular:     Rate and Rhythm: Normal rate and regular rhythm.     Heart sounds: No murmur heard. Pulmonary:     Effort: Pulmonary effort is normal.     Breath sounds: Normal breath sounds.  Abdominal:     General: There is no distension.     Palpations: Abdomen is soft.     Tenderness: There is no abdominal tenderness.  Musculoskeletal:     Cervical back: Neck supple.  Lymphadenopathy:     Cervical: No cervical adenopathy.  Skin:    Coloration: Skin is not pale.  Neurological:     General: No focal deficit present.     Mental Status: She is alert and oriented to person, place, and time.  Psychiatric:        Behavior: Behavior normal.      UC Treatments / Results  Labs (all labs ordered are listed, but only abnormal results are displayed) Labs Reviewed  POCT URINALYSIS DIPSTICK, ED / UC - Abnormal; Notable for the following components:      Result Value   Leukocytes,Ua TRACE (*)    All other components within normal limits  URINE CULTURE    EKG   Radiology No results found.  Procedures Procedures (including critical care time)  Medications Ordered in UC Medications - No data to display  Initial Impression / Assessment and Plan / UC Course  I have reviewed the triage vital signs and the nursing notes.  Pertinent labs & imaging results that were available during my care of the patient were reviewed by  me and considered in my medical  decision making (see chart for details).        Urinalysis ordered at her request. UA shows a trace of white cells only.  Urine culture is sent to await further.  She is given instructions to use MiraLAX as needed for the constipation, and have asked her to follow-up with her primary care Final Clinical Impressions(s) / UC Diagnoses   Final diagnoses:  Lower abdominal pain  Left low back pain, unspecified chronicity, unspecified whether sciatica present     Discharge Instructions      The urinalysis showed a tiny amount of white blood cells in your urine.  We will send for culture, and staff will notify you if it looks like there is a urinary infection that needs antibiotics  For the constipation, you can get MiraLAX over-the-counter and use it daily.  Please follow-up with your primary care about your back pain, so they can see what imaging you might need.       ED Prescriptions   None    PDMP not reviewed this encounter.   Barrett Henle, MD 10/14/22 [phone]

## 2022-10-15 ENCOUNTER — Other Ambulatory Visit: Payer: Self-pay | Admitting: *Deleted

## 2022-10-15 ENCOUNTER — Ambulatory Visit
Admission: RE | Admit: 2022-10-15 | Discharge: 2022-10-15 | Disposition: A | Discharge status: Home / Self Care | Payer: Medicare Other | Source: Ambulatory Visit | Attending: *Deleted | Admitting: *Deleted

## 2022-10-15 DIAGNOSIS — M542 Cervicalgia: Secondary | ICD-10-CM

## 2022-10-15 LAB — URINE CULTURE: Culture: NO GROWTH

## 2022-11-15 DIAGNOSIS — M5030 Other cervical disc degeneration, unspecified cervical region: Secondary | ICD-10-CM | POA: Insufficient documentation

## 2022-12-27 DIAGNOSIS — M5416 Radiculopathy, lumbar region: Secondary | ICD-10-CM | POA: Insufficient documentation

## 2023-01-13 DIAGNOSIS — M5136 Other intervertebral disc degeneration, lumbar region: Secondary | ICD-10-CM | POA: Insufficient documentation

## 2023-01-16 ENCOUNTER — Encounter (HOSPITAL_COMMUNITY): Payer: Self-pay

## 2023-01-16 ENCOUNTER — Ambulatory Visit (HOSPITAL_COMMUNITY)
Admission: EM | Admit: 2023-01-16 | Discharge: 2023-01-16 | Disposition: A | Discharge status: Home / Self Care | Payer: Medicare Other

## 2023-01-16 DIAGNOSIS — F411 Generalized anxiety disorder: Secondary | ICD-10-CM

## 2023-01-16 DIAGNOSIS — I10 Essential (primary) hypertension: Secondary | ICD-10-CM | POA: Diagnosis not present

## 2023-01-16 NOTE — Discharge Instructions (Addendum)
Take your BP maximum of 2 times per day (morning and night).  Continue taking your BP medication as prescribed.  Limit the amount of salt that you are eating as this can help to naturally lower your blood pressure as well.  Increase your exercise.   You may continue your hydroxyzine every 12 hours as needed for anxiety.  Please schedule an appointment with your primary care provider for ongoing evaluation and management of your blood pressure and your anxiety.  Please go to the nearest emergency department if you develop any chest pain, shortness of breath, dizziness, weakness, or any other new or worsening symptoms. Return to urgent care as needed. I hope you feel better!

## 2023-01-16 NOTE — ED Triage Notes (Signed)
Patient states she has had elevated BP x 2 days. Patient sttes she takes Lisinopril 40 mg. Patient states she is compliant with her meds, but has not been sleeping and feeling anxious and states she had a coworker die at her place of employment.

## 2023-01-16 NOTE — ED Provider Notes (Signed)
[facility]    CSN: [id number] Arrival date & time: 01/16/23  0848      History   Chief Complaint Chief Complaint  Patient presents with   Hypertension    HPI Jane [name] is a 67 y.o. female.   Patient with history of HTN presents to urgent care for evaluation of elevated blood pressure outside of her normal range for the last 2 days. She states she was feeling "a little nervous and anxious" yesterday about her blood pressure. She does have a history of anxiety and high blood pressure. Also reports she has not gotten very much sleep over the last couple of nights and wonders if this is contributing to elevated BP readings. Takes hydroxyzine for anxiety and took this yesterday, states this helped a little bit with anxiety but makes her sleepy.  Denies heart palpitations, chest pain, shortness of breath, and dizziness. No recent headaches, vision changes, or abdominal pain.  She has many life stressors. She works 2 jobs and recently found out that one of her coworkers suddenly passed away contributing to her increased anxiety surrounding her blood pressure and her health. Blood pressures at home have been 140/84, 133/81, and 145/87. Highest reading she has had at home has been 153/95. She takes lisinopril-HCTZ for HTN and has not had any recent dose changes. She took her BP medication 2 hours ago.    Hypertension    Past Medical History:  Diagnosis Date   Acid reflux    Arthritis    Hypertension    Reflux    Vasculitis [facility])     Patient Active Problem List   Diagnosis Date Noted   Nausea and vomiting 11/01/2021   Gastroesophageal reflux disease 01/29/2018   Abdominal pain, epigastric 01/29/2018   Plantar fasciitis, bilateral 02/29/2016   Metatarsal deformity 02/29/2016   Tenosynovitis of foot 02/29/2016    Past Surgical History:  Procedure Laterality Date   COLONOSCOPY     TUBAL LIGATION      OB History   No obstetric history on file.      Home  Medications    Prior to Admission medications   Medication Sig Start Date End Date Taking? Authorizing Provider  diclofenac (VOLTAREN) 75 MG EC tablet Take 75 mg by mouth 2 (two) times daily.   Yes [provider]  acetaminophen (TYLENOL) 500 MG tablet Take 1,000 mg by mouth every 6 (six) hours as needed for mild pain.    [provider]  albuterol (VENTOLIN HFA) 108 (90 Base) MCG/ACT inhaler Inhale 2 puffs into the lungs every 4 (four) hours as needed for wheezing or shortness of breath. 04/06/22   Zenia Resides, MD  atorvastatin (LIPITOR) 20 MG tablet Take 20 mg by mouth every evening.    [provider]  B Complex Vitamins (VITAMIN-B COMPLEX) TABS Take 1 tablet by mouth daily.     [provider]  Cholecalciferol 25 MCG (1000 UT) capsule Take 1,000 Units by mouth daily.     [provider]  ibuprofen (ADVIL) 200 MG tablet Take 400 mg by mouth every 6 (six) hours as needed for mild pain.    [provider]  lisinopril-hydrochlorothiazide (ZESTORETIC) 20-12.5 MG tablet Take 1 tablet by mouth daily. 01/10/20   Eustace Moore, MD  MAGNESIUM-CALCIUM-FOLIC ACID PO Take 1 tablet by mouth daily.    [provider]    Family History Family History  Problem Relation Age of Onset   Alcoholism Mother  Lung disease Maternal Grandmother    Diabetes Maternal Uncle    Colon cancer Neg Hx    Stomach cancer Neg Hx     Social History Social History   Tobacco Use   Smoking status: Never   Smokeless tobacco: Never  Vaping Use   Vaping Use: Never used  Substance Use Topics   Alcohol use: No    Alcohol/week: 0.0 standard drinks of alcohol   Drug use: No     Allergies   Iodinated contrast media and Shellfish allergy   Review of Systems Review of Systems Per HPI  Physical Exam Triage Vital Signs ED Triage Vitals  Enc Vitals Group     BP 01/16/23 0903 (!) 170/86     Pulse Rate 01/16/23 0903 (!) 58     Resp 01/16/23  0903 16     Temp 01/16/23 0903 98.1 F (36.7 C)     Temp Source 01/16/23 0903 Oral     SpO2 01/16/23 0903 97 %     Weight --      Height --      Head Circumference --      Peak Flow --      Pain Score 01/16/23 0904 0     Pain Loc --      Pain Edu? --      Excl. in [place]? --    No data found.  Updated Vital Signs BP (!) 170/86 (BP Location: Left Arm)   Pulse (!) 58   Temp 98.1 F (36.7 C) (Oral)   Resp 16   SpO2 97%   Visual Acuity Right Eye Distance:   Left Eye Distance:   Bilateral Distance:    Right Eye Near:   Left Eye Near:    Bilateral Near:     Physical Exam Vitals and nursing note reviewed.  Constitutional:      Appearance: She is not ill-appearing or toxic-appearing.  HENT:     Head: Normocephalic and atraumatic.     Right Ear: Hearing, tympanic membrane, ear canal and external ear normal.     Left Ear: Hearing, tympanic membrane, ear canal and external ear normal.     Nose: Nose normal.     Mouth/Throat:     Lips: Pink.  Eyes:     General: Lids are normal. Vision grossly intact. Gaze aligned appropriately.     Extraocular Movements: Extraocular movements intact.     Conjunctiva/sclera: Conjunctivae normal.  Cardiovascular:     Rate and Rhythm: Normal rate and regular rhythm.     Heart sounds: Normal heart sounds, S1 normal and S2 normal.  Pulmonary:     Effort: Pulmonary effort is normal. No respiratory distress.     Breath sounds: Normal breath sounds and air entry.  Musculoskeletal:     Cervical back: Neck supple.  Skin:    General: Skin is warm and dry.     Capillary Refill: Capillary refill takes less than 2 seconds.     Findings: No rash.  Neurological:     General: No focal deficit present.     Mental Status: She is alert and oriented to person, place, and time. Mental status is at baseline.     Cranial Nerves: Cranial nerves 2-12 are intact. No dysarthria or facial asymmetry.     Sensory: Sensation is intact.     Motor: Motor function is  intact.     Coordination: Coordination is intact.     Gait: Gait is intact.  Comments: Strength and sensation intact to bilateral upper and lower extremities (5/5). Moves all 4 extremities with normal coordination voluntarily. Non-focal neuro exam.   Psychiatric:        Mood and Affect: Mood normal.        Speech: Speech normal.        Behavior: Behavior normal.        Thought Content: Thought content normal.        Judgment: Judgment normal.      UC Treatments / Results  Labs (all labs ordered are listed, but only abnormal results are displayed) Labs Reviewed - No data to display  EKG   Radiology No results found.  Procedures Procedures (including critical care time)  Medications Ordered in UC Medications - No data to display  Initial Impression / Assessment and Plan / UC Course  I have reviewed the triage vital signs and the nursing notes.  Pertinent labs & imaging results that were available during my care of the patient were reviewed by me and considered in my medical decision making (see chart for details).   1. Anxiety state, essential hypertension Suspect elevated BP reading is likely influenced by stress and anxious state. No signs/symptoms indicating need for referral to ER for workup related to HTN. Vitals are hemodynamically stable and patient is well appearing with clear cardiopulmonary exam. I would like for her to monitor her BP at home (maximum 2 times a day) and write numbers down in a log to bring to PCP for clearer picture of how blood pressures are at home. I do not wish to make any changes to her medications at this time as it is not clinically indicated. Advised to schedule an appointment with PCP for ongoing management of HTN. Discussed lifestyle changes to reduce BP naturally.   Discussed physical exam and available lab work findings in clinic with patient.  Counseled patient regarding appropriate use of medications and potential side effects for all  medications recommended or prescribed today. Discussed red flag signs and symptoms of worsening condition,when to call the PCP office, return to urgent care, and when to seek higher level of care in the emergency department. Patient verbalizes understanding and agreement with plan. All questions answered. Patient discharged in stable condition.   Final Clinical Impressions(s) / UC Diagnoses   Final diagnoses:  Anxiety state  Essential hypertension     Discharge Instructions      Take your BP maximum of 2 times per day (morning and night).  Continue taking your BP medication as prescribed.  Limit the amount of salt that you are eating as this can help to naturally lower your blood pressure as well.  Increase your exercise.   You may continue your hydroxyzine every 12 hours as needed for anxiety.  Please schedule an appointment with your primary care provider for ongoing evaluation and management of your blood pressure and your anxiety.  Please go to the nearest emergency department if you develop any chest pain, shortness of breath, dizziness, weakness, or any other new or worsening symptoms. Return to urgent care as needed. I hope you feel better!     ED Prescriptions   None    PDMP not reviewed this encounter.   Carlisle Beers, [place] 01/22/23 2221

## 2023-02-13 DIAGNOSIS — E782 Mixed hyperlipidemia: Secondary | ICD-10-CM | POA: Insufficient documentation

## 2023-03-14 ENCOUNTER — Emergency Department (HOSPITAL_COMMUNITY)
Admission: EM | Admit: 2023-03-14 | Discharge: 2023-03-14 | Disposition: A | Discharge status: Home / Self Care | Payer: Medicare Other | Attending: Emergency Medicine | Admitting: Emergency Medicine

## 2023-03-14 ENCOUNTER — Other Ambulatory Visit: Payer: Self-pay

## 2023-03-14 ENCOUNTER — Encounter (HOSPITAL_COMMUNITY): Payer: Self-pay | Admitting: Emergency Medicine

## 2023-03-14 ENCOUNTER — Emergency Department (HOSPITAL_COMMUNITY): Payer: Medicare Other

## 2023-03-14 DIAGNOSIS — R1030 Lower abdominal pain, unspecified: Secondary | ICD-10-CM

## 2023-03-14 DIAGNOSIS — K5900 Constipation, unspecified: Secondary | ICD-10-CM

## 2023-03-14 DIAGNOSIS — I10 Essential (primary) hypertension: Secondary | ICD-10-CM | POA: Diagnosis not present

## 2023-03-14 LAB — CBC
HCT: 38 % (ref 36.0–46.0)
Hemoglobin: 12.5 g/dL (ref 12.0–15.0)
MCH: 29.8 pg (ref 26.0–34.0)
MCHC: 32.9 g/dL (ref 30.0–36.0)
MCV: 90.7 fL (ref 80.0–100.0)
Platelets: 232 10*3/uL (ref 150–400)
RBC: 4.19 MIL/uL (ref 3.87–5.11)
RDW: 12.7 % (ref 11.5–15.5)
WBC: 7.3 10*3/uL (ref 4.0–10.5)
nRBC: 0 % (ref 0.0–0.2)

## 2023-03-14 LAB — COMPREHENSIVE METABOLIC PANEL
ALT: 24 U/L (ref 0–44)
AST: 27 U/L (ref 15–41)
Albumin: 3.9 g/dL (ref 3.5–5.0)
Alkaline Phosphatase: 101 U/L (ref 38–126)
Anion gap: 10 (ref 5–15)
BUN: 19 mg/dL (ref 8–23)
CO2: 27 mmol/L (ref 22–32)
Calcium: 9.4 mg/dL (ref 8.9–10.3)
Chloride: 100 mmol/L (ref 98–111)
Creatinine, Ser: 0.84 mg/dL (ref 0.44–1.00)
GFR, Estimated: >60 mL/min (ref >60)
Glucose, Bld: 108 mg/dL — ABNORMAL HIGH (ref 70–99)
Potassium: 4 mmol/L (ref 3.5–5.1)
Sodium: 137 mmol/L (ref 135–145)
Total Bilirubin: 0.9 mg/dL (ref 0.3–1.2)
Total Protein: 7.8 g/dL (ref 6.5–8.1)

## 2023-03-14 LAB — URINALYSIS, ROUTINE W REFLEX MICROSCOPIC
Bilirubin Urine: NEGATIVE
Glucose, UA: NEGATIVE mg/dL
Hgb urine dipstick: NEGATIVE
Ketones, ur: NEGATIVE mg/dL
Leukocytes,Ua: NEGATIVE
Nitrite: NEGATIVE
Protein, ur: NEGATIVE mg/dL
Specific Gravity, Urine: 1.004 — ABNORMAL LOW (ref 1.005–1.030)
pH: 7 (ref 5.0–8.0)

## 2023-03-14 LAB — LIPASE, BLOOD: Lipase: 36 U/L (ref 11–51)

## 2023-03-14 MED ORDER — POLYETHYLENE GLYCOL 3350 17 G PO PACK: 17.0000 g | PACK | Freq: Every day | ORAL | 0 refills | Status: DC

## 2023-03-14 MED ORDER — SENNOSIDES-DOCUSATE SODIUM 8.6-50 MG PO TABS: 1.0000 | ORAL_TABLET | Freq: Every evening | ORAL | 0 refills | Status: AC | PRN

## 2023-03-14 NOTE — ED Triage Notes (Signed)
Abd pain and cramping x 1.5 week. Last BM this morning. Denies vomiting has intermittent nausea. Decreased appetite. Pain worse after eating.

## 2023-03-14 NOTE — Discharge Instructions (Signed)

## 2023-03-14 NOTE — ED Provider Notes (Signed)
Emergency Department Provider Note   I have reviewed the triage vital signs and the nursing notes.   HISTORY  Chief Complaint Abdominal Pain   HPI Jane [name] is a 67 y.o. female past history of hypertension presents to the emergency department for evaluation of cramping lower abdominal pain.  Symptoms been present over the past 10 days.  No dysuria, hesitancy, urgency.  Symptoms tend to be worse after eating.  Denies significant epigastric pain although some soreness occasionally in the mid epigastric region although not today.  No pain into the chest.  No vomiting.  Mild nausea at times.  No diarrhea.  She continues to have regular bowel movements.  She states occasionally she will take over-the-counter medications for constipation but that has not been a major issue for her recently. Only prior abdominal surgery is from a tubal ligation.    Past Medical History:  Diagnosis Date   Acid reflux    Arthritis    Hypertension    Reflux    Vasculitis (HCC)     Review of Systems  Constitutional: No fever/chills Cardiovascular: Denies chest pain. Respiratory: Denies shortness of breath. Gastrointestinal: Positive abdominal pain. Occasional nausea, no vomiting.  No diarrhea. Mild constipation. Genitourinary: Negative for dysuria. Musculoskeletal: Negative for back pain. Skin: Negative for rash. Neurological: Negative for headaches.  ____________________________________________   PHYSICAL EXAM:  VITAL SIGNS: ED Triage Vitals  Encounter Vitals Group     BP 03/14/23 0720 129/83     Pulse Rate 03/14/23 0720 90     Resp 03/14/23 0720 16     Temp 03/14/23 0720 98.4 F (36.9 C)     Temp Source 03/14/23 0720 Oral     SpO2 03/14/23 0720 99 %     Weight 03/14/23 0727 204 lb (92.5 kg)     Height 03/14/23 0727 5\' 4"  (1.626 m)   Constitutional: Alert and oriented. Well appearing and in no acute distress. Eyes: Conjunctivae are normal.  Head: Atraumatic. Nose: No  congestion/rhinnorhea. Mouth/Throat: Mucous membranes are moist. Neck: No stridor.   Cardiovascular: Normal rate, regular rhythm. Good peripheral circulation. Grossly normal heart sounds.   Respiratory: Normal respiratory effort.  No retractions. Lungs CTAB. Gastrointestinal: Soft with mild lower abdominal tenderness. No rebound or guarding. Negative Murphy's sign. No distention.  Musculoskeletal: No lower extremity tenderness nor edema. No gross deformities of extremities. Neurologic:  Normal speech and language. No gross focal neurologic deficits are appreciated.  Skin:  Skin is warm, dry and intact. No rash noted.  ____________________________________________   LABS (all labs ordered are listed, but only abnormal results are displayed)  Labs Reviewed  COMPREHENSIVE METABOLIC PANEL - Abnormal; Notable for the following components:      Result Value   Glucose, Bld 108 (*)    All other components within normal limits  URINALYSIS, ROUTINE W REFLEX MICROSCOPIC - Abnormal; Notable for the following components:   Color, Urine COLORLESS (*)    Specific Gravity, Urine 1.004 (*)    All other components within normal limits  LIPASE, BLOOD  CBC    ____________________________________________   PROCEDURES  Procedure(s) performed:   Procedures  None  ____________________________________________   INITIAL IMPRESSION / ASSESSMENT AND PLAN / ED COURSE  Pertinent labs & imaging results that were available during my care of the patient were reviewed by me and considered in my medical decision making (see chart for details).   This patient is Presenting for Evaluation of abdominal pain, which does require a range of treatment options,  and is a complaint that involves a high risk of morbidity and mortality.  The Differential Diagnoses includes but is not exclusive to acute cholecystitis, intrathoracic causes for epigastric abdominal pain, gastritis, duodenitis, pancreatitis, small bowel  or large bowel obstruction, abdominal aortic aneurysm, hernia, gastritis, etc.  Clinical Laboratory Tests Ordered, included UA without infection.  CMP without acute kidney injury, LFT abnormality, lipase abnormality.  CBC without anemia or leukocytosis.  Radiologic Tests Ordered, included CT abdomen/pelvis w/o contrast due to contrast dye allergy. I independently interpreted the images and agree with radiology interpretation.   Cardiac Monitor Tracing which shows NSR.   Social Determinants of Health Risk patient is a non-smoker.    Medical Decision Making: Summary:  Presents emergency department for evaluation of abdominal pain over the past 10 days.  Mild lower abdominal tenderness.  Negative Murphy sign.  Lower suspicion for acute cholecystitis.  Labs are reassuring but given the patient's age and new onset abdominal pain plan for CT abdomen pelvis without contrast due to contrast dye allergy and reassess.  Reevaluation with update and discussion with patient. CT reassuring. Plan for constipation mgmt and PCP follow up. Discussed strict ED return precautions.   Considered admission but no acute surgical process in the abdomen.   Patient's presentation is most consistent with acute presentation with potential threat to life or bodily function.   Disposition: discharge  ____________________________________________  FINAL CLINICAL IMPRESSION(S) / ED DIAGNOSES  Final diagnoses:  Lower abdominal pain  Constipation, unspecified constipation type     NEW OUTPATIENT MEDICATIONS STARTED DURING THIS VISIT:  Discharge Medication List as of 03/14/2023 11:35 AM     START taking these medications   Details  polyethylene glycol (MIRALAX) 17 g packet Take 17 g by mouth daily., Starting Thu 03/14/2023, Normal    senna-docusate (SENOKOT-S) 8.6-50 MG tablet Take 1 tablet by mouth at bedtime as needed for up to 20 days for mild constipation or moderate constipation., Starting Thu 03/14/2023,  Until Wed 04/03/2023 at 2359, Normal        Note:  This document was prepared using Dragon voice recognition software and may include unintentional dictation errors.  Alona Bene, MD, [facility]    Vesta Wheeland, Arlyss Repress, MD 03/15/23 [phone]

## 2023-03-19 ENCOUNTER — Ambulatory Visit: Payer: Medicare Other | Admitting: Internal Medicine

## 2023-04-01 ENCOUNTER — Encounter: Payer: Self-pay | Admitting: Internal Medicine

## 2023-04-01 ENCOUNTER — Ambulatory Visit (INDEPENDENT_AMBULATORY_CARE_PROVIDER_SITE_OTHER): Payer: Medicare Other | Admitting: Internal Medicine

## 2023-04-01 VITALS — BP 130/78 | HR 69 | Temp 97.9°F | Ht 64.0 in | Wt 204.8 lb

## 2023-04-01 DIAGNOSIS — E782 Mixed hyperlipidemia: Secondary | ICD-10-CM | POA: Diagnosis not present

## 2023-04-01 DIAGNOSIS — F410 Panic disorder [episodic paroxysmal anxiety] without agoraphobia: Secondary | ICD-10-CM

## 2023-04-01 DIAGNOSIS — I10 Essential (primary) hypertension: Secondary | ICD-10-CM | POA: Diagnosis not present

## 2023-04-01 MED ORDER — LORAZEPAM 0.5 MG PO TABS: 0.5000 mg | ORAL_TABLET | Freq: Every day | ORAL | 0 refills | Status: DC | PRN

## 2023-04-01 NOTE — Progress Notes (Signed)
[facility] PRIMARY CARE [facility] [address] [place] [address] Dept: [phone] Dept Fax: [phone]  New Patient Office Visit  Subjective:   Kasumi Foulkes 1956-08-06 04/01/2023  Chief Complaint  Patient presents with   Establish Care    HPI: Keala Dasari presents today to establish care at [facility] at [facility]. Introduced to Publishing rights manager role and practice setting.  All questions answered.  Concerns: See below   Cyrah Barringer is a 67 year old female with a past medical history of hypertension, hyperlipidemia, anxiety, diverticulosis, and osteoarthritis.  Patient's blood pressure is well-controlled on lisinopril 40 mg p.o. daily.  She occasionally checks her blood pressure at home.  She is not consistent about taking her atorvastatin 20 mg daily, but states she will improve on her consistency.  No recent flares of diverticulitis.  She recently had a gel injection in her right knee for osteoarthritis.  She does report she has taken lorazepam in the past for panic attacks which did help ease her nerves.  She had tried the alternative hydroxyzine, but states this did not help and only made her drowsy. No chest pain, shortness of breath, headache.  The following portions of the patient's history were reviewed and updated as appropriate: past medical history, past surgical history, family history, social history, allergies, medications, and problem list.   Patient Active Problem List   Diagnosis Date Noted   Mixed hyperlipidemia 02/13/2023   Degeneration of lumbar intervertebral disc 01/13/2023   Lumbar radiculopathy 12/27/2022   DDD (degenerative disc disease), cervical 11/15/2022   Osteoarthritis of right knee 10/07/2022   Diverticulosis 06/26/2022   History of colon polyps 06/26/2022   Internal hemorrhoids 06/26/2022   Hiatal hernia 06/14/2022   Anxiety disorder 01/08/2018   Plantar fasciitis, bilateral 02/29/2016    Metatarsal deformity 02/29/2016   Tenosynovitis of foot 02/29/2016   Panic disorder (episodic paroxysmal anxiety) 09/07/2015   Esophageal reflux 12/01/2013   Primary hypertension 12/01/2013   Past Medical History:  Diagnosis Date   Acid reflux    Anxiety disorder 01/08/2018   Arthritis    Hypertension    Reflux    Vasculitis (HCC)    Vitamin D deficiency 06/03/2014   Past Surgical History:  Procedure Laterality Date   COLONOSCOPY     TUBAL LIGATION     Family History  Problem Relation Age of Onset   Alcoholism Mother    Lung disease Maternal Grandmother    Diabetes Maternal Uncle    Colon cancer Neg Hx    Stomach cancer Neg Hx    Outpatient Medications Prior to Visit  Medication Sig Dispense Refill   acetaminophen (TYLENOL) 500 MG tablet Take 1,000 mg by mouth every 6 (six) hours as needed for mild pain.     atorvastatin (LIPITOR) 20 MG tablet Take 20 mg by mouth every evening.     B Complex Vitamins (VITAMIN-B COMPLEX) TABS Take 1 tablet by mouth daily.      Cholecalciferol 25 MCG (1000 UT) capsule Take 1,000 Units by mouth daily.      diclofenac (VOLTAREN) 75 MG EC tablet Take 75 mg by mouth 2 (two) times daily.     ibuprofen (ADVIL) 200 MG tablet Take 400 mg by mouth every 6 (six) hours as needed for mild pain.     lisinopril (ZESTRIL) 40 MG tablet daily.     MAGNESIUM-CALCIUM-FOLIC ACID PO Take 1 tablet by mouth daily.     polyethylene glycol (MIRALAX) 17 g packet Take 17 g  by mouth daily. 14 each 0   tiZANidine (ZANAFLEX) 4 MG tablet Take 4 mg by mouth every 6 (six) hours as needed (neck pain).     lisinopril-hydrochlorothiazide (ZESTORETIC) 20-12.5 MG tablet Take 1 tablet by mouth daily. 90 tablet 0   STATUS COVID-19/FLU A&B KIT TEST AS DIRECTED TODAY     albuterol (VENTOLIN HFA) 108 (90 Base) MCG/ACT inhaler Inhale 2 puffs into the lungs every 4 (four) hours as needed for wheezing or shortness of breath. (Patient not taking: Reported on 04/01/2023) 1 each 0    senna-docusate (SENOKOT-S) 8.6-50 MG tablet Take 1 tablet by mouth at bedtime as needed for up to 20 days for mild constipation or moderate constipation. 20 tablet 0   hydrOXYzine (ATARAX) 10 MG tablet Take 10 mg by mouth every 8 (eight) hours as needed.     No facility-administered medications prior to visit.   Allergies  Allergen Reactions   Iodinated Contrast Media Itching    Pt developed facial itching after contrast administration.   Shellfish Allergy Hives and Itching    ROS: A complete ROS was performed with pertinent positives/negatives noted in the HPI. The remainder of the ROS are negative.   Objective:   Today's Vitals   04/01/23 1518  BP: 130/78  Pulse: 69  Temp: 97.9 F (36.6 C)  TempSrc: Temporal  SpO2: 99%  Weight: 204 lb 12.8 oz (92.9 kg)  Height: 5\' 4"  (1.626 m)    GENERAL: Well-appearing, in NAD. Well nourished.  SKIN: Pink, warm and dry.  NECK: Trachea midline. Full ROM w/o pain or tenderness. No lymphadenopathy.  RESPIRATORY: Chest wall symmetrical. Respirations even and non-labored. Breath sounds clear to auscultation bilaterally.  CARDIAC: S1, S2 present, regular rate and rhythm. Peripheral pulses 2+ bilaterally.  MSK: Muscle tone and strength appropriate for age. EXTREMITIES: Without clubbing, cyanosis, or edema.  NEUROLOGIC: Steady, even gait.  PSYCH/MENTAL STATUS: Alert, oriented x 3. Cooperative, appropriate mood and affect.   Health Maintenance Due  Topic Date Due   MAMMOGRAM  Never done   Medicare Annual Wellness (AWV)  04/14/2020   INFLUENZA VACCINE  03/21/2023    No results found for any visits on 04/01/23.  Assessment & Plan:  1. Panic disorder (episodic paroxysmal anxiety) - LORazepam (ATIVAN) 0.5 MG tablet; Take 1 tablet (0.5 mg total) by mouth daily as needed for anxiety.  Dispense: 30 tablet; Refill: 0 - Urine drugs of abuse scrn w alc, routine (Ref Lab) -D/C hydroxyzine -Controlled substance agreement signed  2. Primary  hypertension -Well-controlled, continue lisinopril 40 mg p.o. daily  3. Mixed hyperlipidemia -Advised patient to improve on adhering to her atorvastatin 20 mg once daily   Return in about 3 months (around 07/02/2023) for Chronic Condition follow up with fasting lab work at appointment .   Of note, portions of this note may have been created with voice recognition software Physicist, medical). While this note has been edited for accuracy, occasional wrong-word or 'sound-a-like' substitutions may have occurred due to the inherent limitations of voice recognition software.  Salvatore Decent, FNP

## 2023-04-01 NOTE — Patient Instructions (Signed)
Check blood pressure at home. Goal is less than 140/90 consistently

## 2023-04-10 ENCOUNTER — Other Ambulatory Visit: Payer: Self-pay

## 2023-04-10 ENCOUNTER — Emergency Department (HOSPITAL_COMMUNITY)
Admission: EM | Admit: 2023-04-10 | Discharge: 2023-04-10 | Disposition: A | Discharge status: Home / Self Care | Payer: Medicare Other | Attending: Emergency Medicine | Admitting: Emergency Medicine

## 2023-04-10 ENCOUNTER — Encounter (HOSPITAL_COMMUNITY): Payer: Self-pay | Admitting: Emergency Medicine

## 2023-04-10 DIAGNOSIS — M791 Myalgia, unspecified site: Secondary | ICD-10-CM | POA: Insufficient documentation

## 2023-04-10 DIAGNOSIS — Z1152 Encounter for screening for COVID-19: Secondary | ICD-10-CM | POA: Insufficient documentation

## 2023-04-10 DIAGNOSIS — R6889 Other general symptoms and signs: Secondary | ICD-10-CM

## 2023-04-10 DIAGNOSIS — R1084 Generalized abdominal pain: Secondary | ICD-10-CM | POA: Diagnosis not present

## 2023-04-10 DIAGNOSIS — I10 Essential (primary) hypertension: Secondary | ICD-10-CM | POA: Insufficient documentation

## 2023-04-10 DIAGNOSIS — Z20822 Contact with and (suspected) exposure to covid-19: Secondary | ICD-10-CM | POA: Insufficient documentation

## 2023-04-10 DIAGNOSIS — Z79899 Other long term (current) drug therapy: Secondary | ICD-10-CM | POA: Insufficient documentation

## 2023-04-10 LAB — COMPREHENSIVE METABOLIC PANEL
ALT: 30 U/L (ref 0–44)
AST: 33 U/L (ref 15–41)
Albumin: 4 g/dL (ref 3.5–5.0)
Alkaline Phosphatase: 104 U/L (ref 38–126)
Anion gap: 12 (ref 5–15)
BUN: 15 mg/dL (ref 8–23)
CO2: 26 mmol/L (ref 22–32)
Calcium: 9.4 mg/dL (ref 8.9–10.3)
Chloride: 99 mmol/L (ref 98–111)
Creatinine, Ser: 0.73 mg/dL (ref 0.44–1.00)
GFR, Estimated: >60 mL/min (ref >60)
Glucose, Bld: 100 mg/dL — ABNORMAL HIGH (ref 70–99)
Potassium: 3.5 mmol/L (ref 3.5–5.1)
Sodium: 137 mmol/L (ref 135–145)
Total Bilirubin: 1.2 mg/dL (ref 0.3–1.2)
Total Protein: 7.5 g/dL (ref 6.5–8.1)

## 2023-04-10 LAB — RESP PANEL BY RT-PCR (RSV, FLU A&B, COVID)  RVPGX2
Influenza A by PCR: NEGATIVE
Influenza B by PCR: NEGATIVE
Resp Syncytial Virus by PCR: NEGATIVE
SARS Coronavirus 2 by RT PCR: NEGATIVE

## 2023-04-10 LAB — CBC WITH DIFFERENTIAL/PLATELET
Abs Immature Granulocytes: 0.03 10*3/uL (ref 0.00–0.07)
Basophils Absolute: 0.1 10*3/uL (ref 0.0–0.1)
Basophils Relative: 1 %
Eosinophils Absolute: 0.2 10*3/uL (ref 0.0–0.5)
Eosinophils Relative: 1 %
HCT: 37.9 % (ref 36.0–46.0)
Hemoglobin: 12.4 g/dL (ref 12.0–15.0)
Immature Granulocytes: 0 %
Lymphocytes Relative: 18 %
Lymphs Abs: 2.4 10*3/uL (ref 0.7–4.0)
MCH: 30.1 pg (ref 26.0–34.0)
MCHC: 32.7 g/dL (ref 30.0–36.0)
MCV: 92 fL (ref 80.0–100.0)
Monocytes Absolute: 1.1 10*3/uL — ABNORMAL HIGH (ref 0.1–1.0)
Monocytes Relative: 9 %
Neutro Abs: 9.4 10*3/uL — ABNORMAL HIGH (ref 1.7–7.7)
Neutrophils Relative %: 71 %
Platelets: 205 10*3/uL (ref 150–400)
RBC: 4.12 MIL/uL (ref 3.87–5.11)
RDW: 12.9 % (ref 11.5–15.5)
WBC: 13.3 10*3/uL — ABNORMAL HIGH (ref 4.0–10.5)
nRBC: 0 % (ref 0.0–0.2)

## 2023-04-10 LAB — URINALYSIS, ROUTINE W REFLEX MICROSCOPIC
Bacteria, UA: NONE SEEN
Bilirubin Urine: NEGATIVE
Glucose, UA: NEGATIVE mg/dL
Hgb urine dipstick: NEGATIVE
Ketones, ur: NEGATIVE mg/dL
Nitrite: NEGATIVE
Protein, ur: NEGATIVE mg/dL
Specific Gravity, Urine: 1.023 (ref 1.005–1.030)
pH: 5 (ref 5.0–8.0)

## 2023-04-10 LAB — SARS CORONAVIRUS 2 BY RT PCR: SARS Coronavirus 2 by RT PCR: NEGATIVE

## 2023-04-10 LAB — LIPASE, BLOOD: Lipase: 23 U/L (ref 11–51)

## 2023-04-10 NOTE — ED Provider Notes (Signed)
[facility] EMERGENCY DEPARTMENT AT [facility] Provider Note   CSN: [id number] Arrival date & time: 04/10/23  1818     History  Chief Complaint  Patient presents with   Generalized Body Aches    Jane [name] is a 67 y.o. female.  HPI Patient presents feeling bad.  Began this afternoon alert.  States just was aching all over.  Does have some abdominal pain but states this is more chronic for her.  Temperature went up to 99.  No dysuria.  No nausea.  No cough but does feel mildly short of breath.  Worried she could have the flu or COVID.  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   Past Medical History:  Diagnosis Date   Acid reflux    Anxiety disorder 01/08/2018   Arthritis    Hypertension    Reflux    Vasculitis (HCC)    Vitamin D deficiency 06/03/2014    Home Medications Prior to Admission medications   Medication Sig Start Date End Date Taking? Authorizing Provider  acetaminophen (TYLENOL) 500 MG tablet Take 1,000 mg by mouth every 6 (six) hours as needed for mild pain.    [provider]  albuterol (VENTOLIN HFA) 108 (90 Base) MCG/ACT inhaler Inhale 2 puffs into the lungs every 4 (four) hours as needed for wheezing or shortness of breath. Patient not taking: Reported on 04/01/2023 04/06/22   Zenia Resides, MD  atorvastatin (LIPITOR) 20 MG tablet Take 20 mg by mouth every evening.    [provider]  B Complex Vitamins (VITAMIN-B COMPLEX) TABS Take 1 tablet by mouth daily.     [provider]  Cholecalciferol 25 MCG (1000 UT) capsule Take 1,000 Units by mouth daily.     [provider]  diclofenac (VOLTAREN) 75 MG EC tablet Take 75 mg by mouth 2 (two) times daily.    [provider]  ibuprofen (ADVIL) 200 MG tablet Take 400 mg by mouth every 6 (six) hours as needed for mild pain.    [provider]  lisinopril (ZESTRIL) 40 MG tablet daily. 07/25/21   [provider]  LORazepam  (ATIVAN) 0.5 MG tablet Take 1 tablet (0.5 mg total) by mouth daily as needed for anxiety. 04/01/23   Salvatore Decent, FNP  MAGNESIUM-CALCIUM-FOLIC ACID PO Take 1 tablet by mouth daily.    [provider]  polyethylene glycol (MIRALAX) 17 g packet Take 17 g by mouth daily. 03/14/23   Long, Arlyss Repress, MD  tiZANidine (ZANAFLEX) 4 MG tablet Take 4 mg by mouth every 6 (six) hours as needed (neck pain). 09/17/22   [provider]      Allergies    Iodinated contrast media and Shellfish allergy    Review of Systems   Review of Systems  Physical Exam Updated Vital Signs BP (!) 153/74   Pulse 82   Temp 98.9 F (37.2 C)   Resp 18   Ht 5\' 4"  (1.626 m)   Wt 92.8 kg   SpO2 99%   BMI 35.12 kg/m  Physical Exam Vitals reviewed.  Cardiovascular:     Rate and Rhythm: Regular rhythm.  Abdominal:     Tenderness: There is abdominal tenderness.     Comments: Mild diffuse tenderness without rebound or guarding.  No hernia palpated.  Musculoskeletal:     Cervical back: Neck supple.  Neurological:     Mental Status: She is alert.     ED Results / Procedures / Treatments  Labs (all labs ordered are listed, but only abnormal results are displayed) Labs Reviewed  CBC WITH DIFFERENTIAL/PLATELET - Abnormal; Notable for the following components:      Result Value   WBC 13.3 (*)    Neutro Abs 9.4 (*)    Monocytes Absolute 1.1 (*)    All other components within normal limits  COMPREHENSIVE METABOLIC PANEL - Abnormal; Notable for the following components:   Glucose, Bld 100 (*)    All other components within normal limits  URINALYSIS, ROUTINE W REFLEX MICROSCOPIC - Abnormal; Notable for the following components:   Leukocytes,Ua TRACE (*)    All other components within normal limits  SARS CORONAVIRUS 2 BY RT PCR  RESP PANEL BY RT-PCR (RSV, FLU A&B, COVID)  [id number]  LIPASE, BLOOD    EKG None  Radiology No results found.  Procedures Procedures    Medications Ordered in  ED Medications - No data to display  ED Course/ Medical Decision Making/ A&P                                 Medical Decision Making  Patient with myalgias and feeling weak.  No fever.  Differential diagnosis is long but includes viral syndrome, other infections.  Does have some abdominal pain but this is really unchanged from her more chronic pain.  However white count is elevated.  Reviewed previous CT scans and had one a few weeks ago that was negative but has had diverticulitis previously.  Discussed follow-up instructions.  Potentially could be early diverticulitis but do not think we need further imaging at this time.  Appears stable for discharge home.        Final Clinical Impression(s) / ED Diagnoses Final diagnoses:  Flu-like symptoms    Rx / DC Orders ED Discharge Orders     None         Benjiman Core, MD 04/10/23 2309

## 2023-04-10 NOTE — ED Provider Triage Note (Signed)
Emergency Medicine Provider Triage Evaluation Note  Jane [name] , a 67 y.o. female  was evaluated in triage.  Pt complains of generalized body aches and low-grade temperature onset 3 PM today.  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  No measured prior to arrival.  Denies sore throat, fever, chest pain, shortness of breath, rhinorrhea, nasal congestion, cough.  Review of Systems  Positive: Negative:   Physical Exam  BP (!) 161/77 (BP Location: Left Arm)   Pulse 86   Temp 98.9 F (37.2 C)   Resp 18   Ht 5\' 4"  (1.626 m)   Wt 92.8 kg   SpO2 100%   BMI 35.12 kg/m  Gen:   Awake, no distress   Resp:  Normal effort  MSK:   Moves extremities without difficulty  Other:    Medical Decision Making  Medically screening exam initiated at 7:33 PM.  Appropriate orders placed.  Jane [name] was informed that the remainder of the evaluation will be completed by another provider, this initial triage assessment does not replace that evaluation, and the importance of remaining in the ED until their evaluation is complete.  8:06 PM - discussed with patient swab results. Pt notes that she would like to also be evaluated for flu and abdominal pain that is intermittent.  Notes that she has a history of diverticulitis.   Jane [name] A, PA-C 04/10/23 2009

## 2023-04-10 NOTE — ED Notes (Signed)
Called for room x 2 with no answer  

## 2023-04-10 NOTE — Discharge Instructions (Signed)
Your white count is mildly elevated and you do have some abdominal pain.  However this is similar pains you have had in the past.  If symptoms worsen you develop more fevers with abdominal pain may end up needing to be seen again.  Follow-up with your doctor as needed.

## 2023-04-10 NOTE — ED Triage Notes (Signed)
Pt endorses body aches and low grade temp starting around 3 pm, today. 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

## 2023-04-11 ENCOUNTER — Encounter: Payer: Self-pay | Admitting: Internal Medicine

## 2023-04-11 ENCOUNTER — Ambulatory Visit (INDEPENDENT_AMBULATORY_CARE_PROVIDER_SITE_OTHER): Payer: Medicare Other | Admitting: Internal Medicine

## 2023-04-11 VITALS — BP 126/70 | HR 86 | Temp 98.4°F | Ht 64.0 in | Wt 199.6 lb

## 2023-04-11 DIAGNOSIS — R1031 Right lower quadrant pain: Secondary | ICD-10-CM

## 2023-04-11 DIAGNOSIS — K5790 Diverticulosis of intestine, part unspecified, without perforation or abscess without bleeding: Secondary | ICD-10-CM | POA: Diagnosis not present

## 2023-04-11 LAB — CBC WITH DIFFERENTIAL/PLATELET
Basophils Absolute: 0.1 10*3/uL (ref 0.0–0.1)
Basophils Relative: 0.4 % (ref 0.0–3.0)
Eosinophils Absolute: 0 10*3/uL (ref 0.0–0.7)
Eosinophils Relative: 0 % (ref 0.0–5.0)
HCT: 38.7 % (ref 36.0–46.0)
Hemoglobin: 12.4 g/dL (ref 12.0–15.0)
Lymphocytes Relative: 10.7 % — ABNORMAL LOW (ref 12.0–46.0)
Lymphs Abs: 1.8 10*3/uL (ref 0.7–4.0)
MCHC: 32 g/dL (ref 30.0–36.0)
MCV: 92.3 fl (ref 78.0–100.0)
Monocytes Absolute: 1.1 10*3/uL — ABNORMAL HIGH (ref 0.1–1.0)
Monocytes Relative: 7 % (ref 3.0–12.0)
Neutro Abs: 13.4 10*3/uL — ABNORMAL HIGH (ref 1.4–7.7)
Neutrophils Relative %: 81.9 % — ABNORMAL HIGH (ref 43.0–77.0)
Platelets: 211 10*3/uL (ref 150.0–400.0)
RBC: 4.19 Mil/uL (ref 3.87–5.11)
RDW: 13.2 % (ref 11.5–15.5)
WBC: 16.3 10*3/uL — ABNORMAL HIGH (ref 4.0–10.5)

## 2023-04-11 MED ORDER — AMOXICILLIN-POT CLAVULANATE 875-125 MG PO TABS
1.0000 | ORAL_TABLET | Freq: Two times a day (BID) | ORAL | 0 refills | Status: AC
Start: 2023-04-11 — End: 2023-04-18

## 2023-04-11 NOTE — Progress Notes (Signed)
[facility]ARE [facility] [address] [place] [address] Dept: [phone] Dept Fax: [phone]  Acute Care Office Visit  Subjective:   Jane [name] 04-15-1956 04/11/2023  Chief Complaint  Patient presents with   Abdominal Pain    Started last week    HPI: Jane [name] is a 67 yo F who presents for ER follow up.  Patient was seen in ER on 03/14/2023 for lower abdominal cramping that had been ongoing for 10 days.  Patient had CT abdomen pelvis without contrast conducted, which did show the following: IMPRESSION: No bowel obstruction, free air or free fluid. Left-sided colonic diverticula. Moderate colonic stool. There is a stone in the normal caliber appendix. No inflammatory changes.No obstructing renal stone.   She was given prescription for syndicate and MiraLAX for constipation.    Then patient presented to ER on 04/10/2023 for flulike symptoms.  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  No COVID, Flu or RSV was detected.  Patient's white blood count was slightly elevated at 13.3.  Patient was found stable for discharge, with likely cause as viral syndrome. She did report abdominal pain and had a low grade fever 23F. No repeat CT imaging was done.   Today, she reports having RLQ pain this morning. Took some prune juice this morning, and had a small BM. After BM abdominal pain somewhat improved, but not completely. No N/V/D. She did take tylenol around 12pm today, unsure if she had a fever or not this morning.    The following portions of the patient's history were reviewed and updated as appropriate: past medical history, past surgical history, family history, social history, allergies, medications, and problem list.   Patient Active Problem List   Diagnosis Date Noted   Mixed hyperlipidemia 02/13/2023   Degeneration of lumbar intervertebral disc 01/13/2023   Lumbar  radiculopathy 12/27/2022   DDD (degenerative disc disease), cervical 11/15/2022   Osteoarthritis of right knee 10/07/2022   Diverticulosis 06/26/2022   History of colon polyps 06/26/2022   Internal hemorrhoids 06/26/2022   Hiatal hernia 06/14/2022   Anxiety disorder 01/08/2018   Plantar fasciitis, bilateral 02/29/2016   Metatarsal deformity 02/29/2016   Tenosynovitis of foot 02/29/2016   Panic disorder (episodic paroxysmal anxiety) 09/07/2015   Esophageal reflux 12/01/2013   Primary hypertension 12/01/2013   Past Medical History:  Diagnosis Date   Acid reflux    Anxiety disorder 01/08/2018   Arthritis    Hypertension    Reflux    Vasculitis (HCC)    Vitamin D deficiency 06/03/2014   Past Surgical History:  Procedure Laterality Date   COLONOSCOPY     TUBAL LIGATION     Family History  Problem Relation Age of Onset   Alcoholism Mother    Lung disease Maternal Grandmother    Diabetes Maternal Uncle    Colon cancer Neg Hx    Stomach cancer Neg Hx     Current Outpatient Medications:    acetaminophen (TYLENOL) 500 MG tablet, Take 1,000 mg by mouth every 6 (six) hours as needed for mild pain., Disp: , Rfl:    amoxicillin-clavulanate (AUGMENTIN) 875-125 MG tablet, Take 1 tablet by mouth 2 (two) times daily for 7 days., Disp: 14 tablet, Rfl: 0   atorvastatin (LIPITOR) 20 MG tablet, Take 20 mg by mouth every evening., Disp: , Rfl:    B Complex Vitamins (VITAMIN-B COMPLEX) TABS, Take 1 tablet by mouth daily. , Disp: , Rfl:  Cholecalciferol 25 MCG (1000 UT) capsule, Take 1,000 Units by mouth daily. , Disp: , Rfl:    diclofenac (VOLTAREN) 75 MG EC tablet, Take 75 mg by mouth 2 (two) times daily., Disp: , Rfl:    ibuprofen (ADVIL) 200 MG tablet, Take 400 mg by mouth every 6 (six) hours as needed for mild pain., Disp: , Rfl:    lisinopril (ZESTRIL) 40 MG tablet, daily., Disp: , Rfl:    LORazepam (ATIVAN) 0.5 MG tablet, Take 1 tablet (0.5 mg total) by mouth daily as needed for  anxiety., Disp: 30 tablet, Rfl: 0   MAGNESIUM-CALCIUM-FOLIC ACID PO, Take 1 tablet by mouth daily., Disp: , Rfl:    tiZANidine (ZANAFLEX) 4 MG tablet, Take 4 mg by mouth every 6 (six) hours as needed (neck pain)., Disp: , Rfl:    albuterol (VENTOLIN HFA) 108 (90 Base) MCG/ACT inhaler, Inhale 2 puffs into the lungs every 4 (four) hours as needed for wheezing or shortness of breath., Disp: 1 each, Rfl: 0   polyethylene glycol (MIRALAX) 17 g packet, Take 17 g by mouth daily., Disp: 14 each, Rfl: 0 Allergies  Allergen Reactions   Iodinated Contrast Media Itching    Pt developed facial itching after contrast administration.   Shellfish Allergy Hives and Itching    ROS: A complete ROS was performed with pertinent positives/negatives noted in the HPI. The remainder of the ROS are negative.    Objective:   Today's Vitals   04/11/23 1306  BP: 126/70  Pulse: 86  Temp: 98.4 F (36.9 C)  TempSrc: Temporal  SpO2: 98%  Weight: 199 lb 9.6 oz (90.5 kg)  Height: 5\' 4"  (1.626 m)   GENERAL: Well-appearing, in NAD. Well nourished.  SKIN: Pink, warm and dry. No rash, lesion, ulceration, or ecchymoses.  NECK: Trachea midline. Full ROM w/o pain or tenderness. No lymphadenopathy.  RESPIRATORY: Chest wall symmetrical. Respirations even and non-labored. Breath sounds clear to auscultation bilaterally.  CARDIAC: S1, S2 present, regular rate and rhythm. Peripheral pulses 2+ bilaterally.  GI: Abdomen soft, marked tenderness to palpation to RLQ. Normoactive bowel sounds. No rebound tenderness. No hepatomegaly or splenomegaly. No CVA tenderness.  EXTREMITIES: Without clubbing, cyanosis, or edema.  NEUROLOGIC: No motor or sensory deficits. Steady, even gait.  PSYCH/MENTAL STATUS: Alert, oriented x 3. Cooperative, appropriate mood and affect.    Results for orders placed or performed in visit on 04/11/23  CBC w/Diff  Result Value Ref Range   WBC 16.3 (H) 4.0 - 10.5 K/uL   RBC 4.19 3.87 - 5.11 Mil/uL    Hemoglobin 12.4 12.0 - 15.0 g/dL   HCT 84.6 96.2 - 95.2 %   MCV 92.3 78.0 - 100.0 fl   MCHC 32.0 30.0 - 36.0 g/dL   RDW 84.1 32.4 - 40.1 %   Platelets 211.0 150.0 - 400.0 K/uL   Neutrophils Relative % 81.9 (H) 43.0 - 77.0 %   Lymphocytes Relative 10.7 (L) 12.0 - 46.0 %   Monocytes Relative 7.0 3.0 - 12.0 %   Eosinophils Relative 0.0 0.0 - 5.0 %   Basophils Relative 0.4 0.0 - 3.0 %   Neutro Abs 13.4 (H) 1.4 - 7.7 K/uL   Lymphs Abs 1.8 0.7 - 4.0 K/uL   Monocytes Absolute 1.1 (H) 0.1 - 1.0 K/uL   Eosinophils Absolute 0.0 0.0 - 0.7 K/uL   Basophils Absolute 0.1 0.0 - 0.1 K/uL      Assessment & Plan:  1. Right lower quadrant abdominal pain - amoxicillin-clavulanate (AUGMENTIN) 875-125 MG  tablet; Take 1 tablet by mouth 2 (two) times daily for 7 days.  Dispense: 14 tablet; Refill: 0 - CBC w/Diff  2. Diverticulosis - amoxicillin-clavulanate (AUGMENTIN) 875-125 MG tablet; Take 1 tablet by mouth 2 (two) times daily for 7 days.  Dispense: 14 tablet; Refill: 0  Given patient's history of diverticulitis, current symptoms, and obvious tenderness to right lower quadrant, I am concerned that she may have a diverticulosis flare.  Will obtain a repeat CBC today to recheck white cell count and treat prophylactically with Augmentin.  Patient made aware if symptoms do not improve or worsen to seek immediate treatment.  Meds ordered this encounter  Medications   amoxicillin-clavulanate (AUGMENTIN) 875-125 MG tablet    Sig: Take 1 tablet by mouth 2 (two) times daily for 7 days.    Dispense:  14 tablet    Refill:  0    Order Specific Question:   Supervising Provider    Answer:   Garnette Gunner [[id number]]   Orders Placed This Encounter  Procedures   CBC w/Diff   Lab Orders         CBC w/Diff     No images are attached to the encounter or orders placed in the encounter.  Return for Scheduled Routine Office Visits and as needed.   Salvatore Decent, FNP

## 2023-04-11 NOTE — Patient Instructions (Signed)
Take antibiotic as prescribed

## 2023-04-12 ENCOUNTER — Telehealth: Payer: Self-pay | Admitting: Internal Medicine

## 2023-04-12 NOTE — Telephone Encounter (Signed)
Pt called at 4:10 today to ask there dr about the medicine that was prescribed if she can also take another medication that she also had along with the one you prescribe her . Please give the pt a call

## 2023-04-12 NOTE — Telephone Encounter (Signed)
Pt would like to know if she should continue her acid reflux med while taking her antibiotic.

## 2023-04-15 ENCOUNTER — Encounter: Payer: Self-pay | Admitting: Internal Medicine

## 2023-04-15 ENCOUNTER — Ambulatory Visit (INDEPENDENT_AMBULATORY_CARE_PROVIDER_SITE_OTHER): Payer: Medicare Other | Admitting: Internal Medicine

## 2023-04-15 VITALS — BP 136/80 | HR 65 | Temp 98.0°F | Ht 64.0 in | Wt 197.8 lb

## 2023-04-15 DIAGNOSIS — K5792 Diverticulitis of intestine, part unspecified, without perforation or abscess without bleeding: Secondary | ICD-10-CM

## 2023-04-15 DIAGNOSIS — K644 Residual hemorrhoidal skin tags: Secondary | ICD-10-CM

## 2023-04-15 LAB — CBC WITH DIFFERENTIAL/PLATELET
Basophils Absolute: 0.1 10*3/uL (ref 0.0–0.1)
Basophils Relative: 0.9 % (ref 0.0–3.0)
Eosinophils Absolute: 0.1 10*3/uL (ref 0.0–0.7)
Eosinophils Relative: 1.9 % (ref 0.0–5.0)
HCT: 36.6 % (ref 36.0–46.0)
Hemoglobin: 11.9 g/dL — ABNORMAL LOW (ref 12.0–15.0)
Lymphocytes Relative: 23.9 % (ref 12.0–46.0)
Lymphs Abs: 1.7 10*3/uL (ref 0.7–4.0)
MCHC: 32.6 g/dL (ref 30.0–36.0)
MCV: 92.4 fl (ref 78.0–100.0)
Monocytes Absolute: 0.8 10*3/uL (ref 0.1–1.0)
Monocytes Relative: 10.7 % (ref 3.0–12.0)
Neutro Abs: 4.4 10*3/uL (ref 1.4–7.7)
Neutrophils Relative %: 62.6 % (ref 43.0–77.0)
Platelets: 238 10*3/uL (ref 150.0–400.0)
RBC: 3.96 Mil/uL (ref 3.87–5.11)
RDW: 12.8 % (ref 11.5–15.5)
WBC: 7 10*3/uL (ref 4.0–10.5)

## 2023-04-15 NOTE — Telephone Encounter (Signed)
Patient was seen in the office today to discuss concerns

## 2023-04-15 NOTE — Progress Notes (Signed)
[facility] [address] [place] [address] Dept: [phone] Dept Fax: [phone]  Acute Care Office Visit  Subjective:   Jane [name] 30-May-1956 04/15/2023  Chief Complaint  Patient presents with   Follow-up    HPI: Jane [name] presents for follow up of abdominal pain. Patient was seen on 04/12/23 for LLQ abdominal pain. Hx of diverticulitis. WBC 16.3. Patient has been taking Augmentin as prescribed. States the abdominal pain has greatly improved since starting antibiotic. Does report having hemorrhoids due to having loose BM's, hx of hemorrhoids. She has used some aloe vera to help.  Denies fever, N/V, blood in stool, urinary symptoms.     The following portions of the patient's history were reviewed and updated as appropriate: past medical history, past surgical history, family history, social history, allergies, medications, and problem list.   Patient Active Problem List   Diagnosis Date Noted   Mixed hyperlipidemia 02/13/2023   Degeneration of lumbar intervertebral disc 01/13/2023   Lumbar radiculopathy 12/27/2022   DDD (degenerative disc disease), cervical 11/15/2022   Osteoarthritis of right knee 10/07/2022   Diverticulosis 06/26/2022   History of colon polyps 06/26/2022   Internal hemorrhoids 06/26/2022   Hiatal hernia 06/14/2022   Anxiety disorder 01/08/2018   Plantar fasciitis, bilateral 02/29/2016   Metatarsal deformity 02/29/2016   Tenosynovitis of foot 02/29/2016   Panic disorder (episodic paroxysmal anxiety) 09/07/2015   Esophageal reflux 12/01/2013   Primary hypertension 12/01/2013   Past Medical History:  Diagnosis Date   Acid reflux    Anxiety disorder 01/08/2018   Arthritis    Hypertension    Reflux    Vasculitis (HCC)    Vitamin D deficiency 06/03/2014   Past Surgical History:  Procedure Laterality Date   COLONOSCOPY     TUBAL LIGATION     Family History  Problem Relation Age  of Onset   Alcoholism Mother    Lung disease Maternal Grandmother    Diabetes Maternal Uncle    Colon cancer Neg Hx    Stomach cancer Neg Hx     Current Outpatient Medications:    acetaminophen (TYLENOL) 500 MG tablet, Take 1,000 mg by mouth every 6 (six) hours as needed for mild pain., Disp: , Rfl:    albuterol (VENTOLIN HFA) 108 (90 Base) MCG/ACT inhaler, Inhale 2 puffs into the lungs every 4 (four) hours as needed for wheezing or shortness of breath., Disp: 1 each, Rfl: 0   amoxicillin-clavulanate (AUGMENTIN) 875-125 MG tablet, Take 1 tablet by mouth 2 (two) times daily for 7 days., Disp: 14 tablet, Rfl: 0   atorvastatin (LIPITOR) 20 MG tablet, Take 20 mg by mouth every evening., Disp: , Rfl:    B Complex Vitamins (VITAMIN-B COMPLEX) TABS, Take 1 tablet by mouth daily. , Disp: , Rfl:    Cholecalciferol 25 MCG (1000 UT) capsule, Take 1,000 Units by mouth daily. , Disp: , Rfl:    diclofenac (VOLTAREN) 75 MG EC tablet, Take 75 mg by mouth 2 (two) times daily., Disp: , Rfl:    ibuprofen (ADVIL) 200 MG tablet, Take 400 mg by mouth every 6 (six) hours as needed for mild pain., Disp: , Rfl:    lisinopril (ZESTRIL) 40 MG tablet, daily., Disp: , Rfl:    LORazepam (ATIVAN) 0.5 MG tablet, Take 1 tablet (0.5 mg total) by mouth daily as needed for anxiety., Disp: 30 tablet, Rfl: 0   MAGNESIUM-CALCIUM-FOLIC ACID PO, Take 1 tablet by mouth daily., Disp: , Rfl:  polyethylene glycol (MIRALAX) 17 g packet, Take 17 g by mouth daily., Disp: 14 each, Rfl: 0   tiZANidine (ZANAFLEX) 4 MG tablet, Take 4 mg by mouth every 6 (six) hours as needed (neck pain)., Disp: , Rfl:  Allergies  Allergen Reactions   Iodinated Contrast Media Itching    Pt developed facial itching after contrast administration.   Shellfish Allergy Hives and Itching     ROS: A complete ROS was performed with pertinent positives/negatives noted in the HPI. The remainder of the ROS are negative.    Objective:   Today's Vitals    04/15/23 0941  BP: 136/80  Pulse: 65  Temp: 98 F (36.7 C)  TempSrc: Temporal  SpO2: 99%  Weight: 197 lb 12.8 oz (89.7 kg)  Height: 5\' 4"  (1.626 m)    GENERAL: Well-appearing, in NAD. Well nourished.  SKIN: Pink, warm and dry.  RESPIRATORY: Chest wall symmetrical. Respirations even and non-labored. Breath sounds clear to auscultation bilaterally.  CARDIAC: S1, S2 present, regular rate and rhythm. Peripheral pulses 2+ bilaterally.  GI: Abdomen soft, non-tender. Normoactive bowel sounds. No rebound tenderness. No hepatomegaly or splenomegaly. No CVA tenderness.  EXTREMITIES: Without clubbing, cyanosis, or edema.  PSYCH/MENTAL STATUS: Alert, oriented x 3. Cooperative, appropriate mood and affect.    No results found for any visits on 04/15/23.    Assessment & Plan:  1. Diverticulitis - CBC w/Diff - continue augmentin as prescribed until course is complete  2. External hemorrhoid - OTC preparation H  - OTC Tucks pads   Orders Placed This Encounter  Procedures   CBC w/Diff   Lab Orders         CBC w/Diff     No images are attached to the encounter or orders placed in the encounter.  Return in about 3 months (around 07/16/2023) for Hypertension, Cholesterol, Anxiety/Depression, Vitamin D deficiency - fasting labs at appointment.   Salvatore Decent, FNP

## 2023-04-15 NOTE — Patient Instructions (Signed)
Continue Antibiotic until completed  You can take the esomeprazole for acid reflux with the antibiotic if needed

## 2023-05-06 ENCOUNTER — Telehealth: Payer: Self-pay | Admitting: Internal Medicine

## 2023-05-06 ENCOUNTER — Other Ambulatory Visit: Payer: Self-pay | Admitting: Internal Medicine

## 2023-05-06 DIAGNOSIS — K5792 Diverticulitis of intestine, part unspecified, without perforation or abscess without bleeding: Secondary | ICD-10-CM

## 2023-05-06 NOTE — Telephone Encounter (Signed)
Please give pt a cb at   [phone]

## 2023-05-06 NOTE — Telephone Encounter (Signed)
Patient would like referral for diverticulitis

## 2023-05-06 NOTE — Telephone Encounter (Signed)
GI referral placed

## 2023-05-06 NOTE — Telephone Encounter (Signed)
Attempted to call patient unable to leave voicemail.

## 2023-05-06 NOTE — Telephone Encounter (Signed)
Jane [name] [phone]   Please call the pt about a referral for her stomach.

## 2023-05-08 NOTE — Telephone Encounter (Signed)
Patient is asking if another GI office can see her because the earliest appt [facility] has is in December

## 2023-05-09 NOTE — Telephone Encounter (Signed)
I will call around to see if another office has appts before Dec.

## 2023-05-11 ENCOUNTER — Emergency Department (HOSPITAL_COMMUNITY)
Admission: EM | Admit: 2023-05-11 | Discharge: 2023-05-12 | Discharge status: Against Medical Advice | Payer: Medicare Other | Attending: Emergency Medicine | Admitting: Emergency Medicine

## 2023-05-11 ENCOUNTER — Other Ambulatory Visit: Payer: Self-pay

## 2023-05-11 ENCOUNTER — Encounter (HOSPITAL_COMMUNITY): Payer: Self-pay

## 2023-05-11 DIAGNOSIS — R42 Dizziness and giddiness: Secondary | ICD-10-CM | POA: Diagnosis not present

## 2023-05-11 DIAGNOSIS — R002 Palpitations: Secondary | ICD-10-CM | POA: Diagnosis not present

## 2023-05-11 DIAGNOSIS — R110 Nausea: Secondary | ICD-10-CM | POA: Diagnosis not present

## 2023-05-11 DIAGNOSIS — I10 Essential (primary) hypertension: Secondary | ICD-10-CM | POA: Insufficient documentation

## 2023-05-11 DIAGNOSIS — R109 Unspecified abdominal pain: Secondary | ICD-10-CM | POA: Diagnosis present

## 2023-05-11 DIAGNOSIS — Z5321 Procedure and treatment not carried out due to patient leaving prior to being seen by health care provider: Secondary | ICD-10-CM | POA: Diagnosis not present

## 2023-05-11 LAB — COMPREHENSIVE METABOLIC PANEL
ALT: 27 U/L (ref 0–44)
AST: 30 U/L (ref 15–41)
Albumin: 3.9 g/dL (ref 3.5–5.0)
Alkaline Phosphatase: 98 U/L (ref 38–126)
Anion gap: 10 (ref 5–15)
BUN: 11 mg/dL (ref 8–23)
CO2: 26 mmol/L (ref 22–32)
Calcium: 9.1 mg/dL (ref 8.9–10.3)
Chloride: 101 mmol/L (ref 98–111)
Creatinine, Ser: 0.81 mg/dL (ref 0.44–1.00)
GFR, Estimated: >60 mL/min (ref >60)
Glucose, Bld: 94 mg/dL (ref 70–99)
Potassium: 3.6 mmol/L (ref 3.5–5.1)
Sodium: 137 mmol/L (ref 135–145)
Total Bilirubin: 0.8 mg/dL (ref 0.3–1.2)
Total Protein: 7.5 g/dL (ref 6.5–8.1)

## 2023-05-11 LAB — CBC
HCT: 36.6 % (ref 36.0–46.0)
Hemoglobin: 11.9 g/dL — ABNORMAL LOW (ref 12.0–15.0)
MCH: 29.7 pg (ref 26.0–34.0)
MCHC: 32.5 g/dL (ref 30.0–36.0)
MCV: 91.3 fL (ref 80.0–100.0)
Platelets: 210 10*3/uL (ref 150–400)
RBC: 4.01 MIL/uL (ref 3.87–5.11)
RDW: 13 % (ref 11.5–15.5)
WBC: 8.1 10*3/uL (ref 4.0–10.5)
nRBC: 0 % (ref 0.0–0.2)

## 2023-05-11 LAB — URINALYSIS, ROUTINE W REFLEX MICROSCOPIC
Bilirubin Urine: NEGATIVE
Glucose, UA: NEGATIVE mg/dL
Hgb urine dipstick: NEGATIVE
Ketones, ur: NEGATIVE mg/dL
Nitrite: NEGATIVE
Protein, ur: NEGATIVE mg/dL
Specific Gravity, Urine: 1.004 — ABNORMAL LOW (ref 1.005–1.030)
pH: 7 (ref 5.0–8.0)

## 2023-05-11 LAB — LIPASE, BLOOD: Lipase: 28 U/L (ref 11–51)

## 2023-05-11 NOTE — ED Notes (Signed)
Pt left [facility] due to long ED wait times.  

## 2023-05-11 NOTE — ED Triage Notes (Signed)
Pt reports abdominal pain and nausea that started on Tuesday associated with feeling lightheaded. She reports hx of diverticulitis. Denies bloody stools.

## 2023-05-12 ENCOUNTER — Emergency Department (HOSPITAL_COMMUNITY): Payer: Medicare Other

## 2023-05-12 ENCOUNTER — Emergency Department (HOSPITAL_COMMUNITY)
Admission: EM | Admit: 2023-05-12 | Discharge: 2023-05-12 | Disposition: A | Discharge status: Home / Self Care | Payer: Medicare Other | Attending: Emergency Medicine | Admitting: Emergency Medicine

## 2023-05-12 ENCOUNTER — Encounter (HOSPITAL_COMMUNITY): Payer: Self-pay | Admitting: Emergency Medicine

## 2023-05-12 DIAGNOSIS — R002 Palpitations: Secondary | ICD-10-CM | POA: Insufficient documentation

## 2023-05-12 DIAGNOSIS — I10 Essential (primary) hypertension: Secondary | ICD-10-CM | POA: Insufficient documentation

## 2023-05-12 DIAGNOSIS — R110 Nausea: Secondary | ICD-10-CM | POA: Insufficient documentation

## 2023-05-12 DIAGNOSIS — R0602 Shortness of breath: Secondary | ICD-10-CM | POA: Insufficient documentation

## 2023-05-12 DIAGNOSIS — R109 Unspecified abdominal pain: Secondary | ICD-10-CM | POA: Insufficient documentation

## 2023-05-12 LAB — TROPONIN I (HIGH SENSITIVITY)
Troponin I (High Sensitivity): 2 ng/L (ref <18)
Troponin I (High Sensitivity): <2 ng/L (ref <18)

## 2023-05-12 NOTE — ED Provider Notes (Signed)
[place] EMERGENCY DEPARTMENT AT [facility] Provider Note   CSN: [id number] Arrival date & time: 05/12/23  2130     History  Chief Complaint  Patient presents with   Abdominal Pain    Jane [name] is a 67 y.o. female.  HPI   Patient with medical history including hypertension, vasculitis, anxiety, presenting with complaints of heart palpitations.  Patient states that she woke up this morning and felt like her heart was beating fast, she states she felt slightly short of breath, this has since resolved, she denies actually  chest pain or pleuritic chest pain, no cardiac history, no history PEs or DVTs not on oral birth control no recent surgeries no long immobilizations.  Patient states that she has felt like this in the past and believes it could be from anxiety.  Patient states that she initially came to the ER last night because she was concerned for abdominal pain, she states it started on Tuesday, states states she felt slight pain in her left side, pain was intermittent, she associated nausea without vomiting, still passing gas having normal bowel movements denies any urinary symptoms.  Patient states that she had lab work performed yesterday but left due to the wait, she states that she is has no pain at this time and she really just wants her checked out for her heart palpitations which she thinks could be from her anxiety.  Reviewed patient's chart had lipase which was unremarkable, CMP which was unremarkable CBC showing normocytic anemia which is at her baseline, as well as a UA which shows moderate leukocytes no red blood cells no white blood cells with rare bacteria.  Home Medications Prior to Admission medications   Medication Sig Start Date End Date Taking? Authorizing Provider  acetaminophen (TYLENOL) 500 MG tablet Take 1,000 mg by mouth every 6 (six) hours as needed for mild pain.    [provider]  albuterol (VENTOLIN HFA) 108 (90 Base) MCG/ACT  inhaler Inhale 2 puffs into the lungs every 4 (four) hours as needed for wheezing or shortness of breath. 04/06/22   Zenia Resides, MD  atorvastatin (LIPITOR) 20 MG tablet Take 20 mg by mouth every evening.    [provider]  B Complex Vitamins (VITAMIN-B COMPLEX) TABS Take 1 tablet by mouth daily.     [provider]  Cholecalciferol 25 MCG (1000 UT) capsule Take 1,000 Units by mouth daily.     [provider]  diclofenac (VOLTAREN) 75 MG EC tablet Take 75 mg by mouth 2 (two) times daily.    [provider]  ibuprofen (ADVIL) 200 MG tablet Take 400 mg by mouth every 6 (six) hours as needed for mild pain.    [provider]  lisinopril (ZESTRIL) 40 MG tablet daily. 07/25/21   [provider]  LORazepam (ATIVAN) 0.5 MG tablet Take 1 tablet (0.5 mg total) by mouth daily as needed for anxiety. 04/01/23   Salvatore Decent, FNP  MAGNESIUM-CALCIUM-FOLIC ACID PO Take 1 tablet by mouth daily.    [provider]  polyethylene glycol (MIRALAX) 17 g packet Take 17 g by mouth daily. 03/14/23   Long, Arlyss Repress, MD  tiZANidine (ZANAFLEX) 4 MG tablet Take 4 mg by mouth every 6 (six) hours as needed (neck pain). 09/17/22   [provider]      Allergies    Iodinated contrast media and Shellfish allergy    Review of Systems   Review of Systems  Constitutional:  Negative  for chills and fever.  Respiratory:  Negative for shortness of breath.   Cardiovascular:  Positive for palpitations. Negative for chest pain.  Gastrointestinal:  Negative for abdominal pain.  Neurological:  Negative for headaches.    Physical Exam Updated Vital Signs BP (!) 154/77   Pulse 86   Temp 98.9 F (37.2 C) (Oral)   Resp 20   SpO2 99%  Physical Exam Vitals and nursing note reviewed.  Constitutional:      General: She is not in acute distress.    Appearance: She is not ill-appearing.  HENT:     Head: Normocephalic and atraumatic.     Nose: No  congestion.  Eyes:     Conjunctiva/sclera: Conjunctivae normal.  Cardiovascular:     Rate and Rhythm: Normal rate and regular rhythm.     Pulses: Normal pulses.     Heart sounds: No murmur heard.    No friction rub. No gallop.  Pulmonary:     Effort: No respiratory distress.     Breath sounds: No wheezing, rhonchi or rales.  Abdominal:     Palpations: Abdomen is soft.     Tenderness: There is no abdominal tenderness. There is no right CVA tenderness or left CVA tenderness.     Comments: Abdomen is soft nontender  Musculoskeletal:     Right lower leg: No edema.     Left lower leg: No edema.  Skin:    General: Skin is warm and dry.  Neurological:     Mental Status: She is alert.  Psychiatric:        Mood and Affect: Mood normal.     ED Results / Procedures / Treatments   Labs (all labs ordered are listed, but only abnormal results are displayed) Labs Reviewed  TROPONIN I (HIGH SENSITIVITY)    EKG None  Radiology No results found.  Procedures Procedures    Medications Ordered in ED Medications - No data to display  ED Course/ Medical Decision Making/ A&P Clinical Course as of 05/12/23 0636  Wynelle Link May 12, 2023  1610 Abdominal pain with palpitations (she says provoked by anxiety): No symptoms at all. 2 trops if negative can go home.  [JR]    Clinical Course User Index [JR] Gareth Eagle, PA-C                                 Medical Decision Making Amount and/or Complexity of Data Reviewed Radiology: ordered.   This patient presents to the ED for concern of heart palpitations, this involves an extensive number of treatment options, and is a complaint that carries with it a high risk of complications and morbidity.  The differential diagnosis includes CS, PE, dissection,    Additional history obtained:  Additional history obtained from N/A External records from outside source obtained and reviewed including PCP notes   Co morbidities that complicate  the patient evaluation  Anxiety  Social Determinants of Health:  N/A    Lab Tests:  I Ordered, and personally interpreted labs.  The pertinent results include: Lab work pending   Imaging Studies ordered:  I ordered imaging studies including chest x-ray I independently visualized and interpreted imaging which showed pending I agree with the radiologist interpretation   Cardiac Monitoring:  The patient was maintained on a cardiac monitor.  I personally viewed and interpreted the cardiac monitored which showed an underlying rhythm of: EKG pending   Medicines ordered  and prescription drug management:  I ordered medication including n/a I have reviewed the patients home medicines and have made adjustments as needed  Critical Interventions:  N/a   Reevaluation:  Presents with heart palpitations which has resolved, will obtain troponins chest x-ray EKG and continue to monitor  Consultations Obtained:  N/a    Test Considered:  CT abdomen pelvis-deferred as she has a nonsurgical abdomen lab work is all unremarkable.    Rule out Low suspicion for PE as patient denies pleuritic chest pain, shortness of breath, patient denies leg pain, no pedal edema noted on exam, vital signs reassuring nontachypneic nonhypoxic no new ox requirements. low suspicion for AAA or aortic dissection as history is atypical, patient has low risk factors.  I have low suspicion for liver or gallbladder abnormality as she has no right upper quadrant tenderness, liver enzymes, alk phos, T bili all within normal limits.  Low suspicion for pancreatitis as lipase is within normal limits.  Low suspicion for ruptured stomach ulcer as she has no peritoneal sign present on exam.  Low suspicion for bowel obstruction as abdomen is nondistended normal bowel sounds, so passing gas and having normal bowel movements.  Low suspicion for complicated diverticulitis as she is nontoxic-appearing, vital signs reassuring  no leukocytosis present.  Low suspicion for appendicitis as she has no right lower quadrant tenderness, vital signs reassuring.  I have low suspicion for intra-abdominal infection as she has low risk factors, vital signs reassuring, no leukocytosis, will defer imaging at this time.    Dispostion and problem list  Due to shift change patient will be handed off to [facility]   Follow-up on troponins, unremarkable patient can be discharged home.          Final Clinical Impression(s) / ED Diagnoses Final diagnoses:  Palpitations    Rx / DC Orders ED Discharge Orders     None         Carroll Sage, PA-C 05/12/23 0636    Mesner, Barbara Cower, MD 05/13/23 [phone]

## 2023-05-12 NOTE — ED Provider Notes (Signed)
Accepted handoff at shift change from [facility]. Please see prior provider note for more detail.   Briefly: Patient is 67 y.o. with history of hypertension, vasculitis and anxiety presenting for heart palpitations.  DDX: concern for ACS, PE, dissection  Plan: Reassess and follow-up on pending second troponin, but if no delta patient can be discharged with PCP follow-up.   Physical Exam  BP 125/70   Pulse 61   Temp 98.5 F (36.9 C) (Oral)   Resp 16   SpO2 99%   Physical Exam  Procedures  Procedures  ED Course / MDM   Clinical Course as of 05/12/23 1006  Sun May 12, 2023  1610 Abdominal pain with palpitations (she says provoked by anxiety): No symptoms at all. 2 trops if negative can go home.  [JR]    Clinical Course User Index [JR] Gareth Eagle, PA-C   Medical Decision Making Amount and/or Complexity of Data Reviewed Radiology: ordered.   On reassessment, patient remained clinically well without palpitations or chest pain.  Second troponin reassuring.  Advise follow-up with PCP.  Discussed return precautions.  Discharged.       Gareth Eagle, PA-C 05/12/23 1015    Elayne Snare K, DO 05/12/23 1531

## 2023-05-12 NOTE — ED Triage Notes (Signed)
Pt was here last night but left due to wait times. Blood work and urine done in triage. She reports that she has belly pain and hx of diverticulosis. Has GI doc but been while since seen and cannot get in to see for while. Reports stomach not really bothering her today but wanted to get checked out.  Also endorsed waking up with feeling of racing heart... this has been a redcurrant issue and PCP seen her for it and thinks anxiety. No chest pain and pt HR 94.

## 2023-05-12 NOTE — ED Notes (Signed)
Pt says she woke up this morning an noticed that her BP 150/90 and pulse 114 which is abnormal for her. She says she does have some anxiety but she wants her heart checked and just make sure everything is alright.

## 2023-05-12 NOTE — Discharge Instructions (Signed)
Evaluation today was overall reassuring.  Recommend you follow-up with your PCP. If your symptoms return, you have chest pain shortness of breath or any other concern please return emergency department further evaluation.

## 2023-05-13 ENCOUNTER — Telehealth: Payer: Self-pay | Admitting: Internal Medicine

## 2023-05-13 MED ORDER — LISINOPRIL 40 MG PO TABS: 40.0000 mg | ORAL_TABLET | Freq: Every day | ORAL | 1 refills | Status: DC

## 2023-05-13 NOTE — Telephone Encounter (Signed)
Prescription Request  05/13/2023  LOV: 04/15/2023  What is the name of the medication or equipment? lisinopril   Have you contacted your pharmacy to request a refill? No   Which pharmacy would you like this sent to?  [id number] [place], [place] - [phone] [address] AT [facility] OF [facility] & [address] [place] [address] Phone: [phone] Fax: [phone]   Patient notified that their request is being sent to the clinical staff for review and that they should receive a response within 2 business days.   Please advise at Mobile [phone] (mobile)

## 2023-07-16 ENCOUNTER — Ambulatory Visit: Payer: Medicare Other | Admitting: Internal Medicine

## 2023-07-16 ENCOUNTER — Encounter: Payer: Self-pay | Admitting: Internal Medicine

## 2023-07-16 VITALS — BP 136/84 | HR 63 | Temp 97.9°F | Ht 64.0 in | Wt 199.2 lb

## 2023-07-16 DIAGNOSIS — I10 Essential (primary) hypertension: Secondary | ICD-10-CM | POA: Diagnosis not present

## 2023-07-16 DIAGNOSIS — E782 Mixed hyperlipidemia: Secondary | ICD-10-CM

## 2023-07-16 DIAGNOSIS — F410 Panic disorder [episodic paroxysmal anxiety] without agoraphobia: Secondary | ICD-10-CM

## 2023-07-16 DIAGNOSIS — M150 Primary generalized (osteo)arthritis: Secondary | ICD-10-CM

## 2023-07-16 DIAGNOSIS — Z23 Encounter for immunization: Secondary | ICD-10-CM

## 2023-07-16 DIAGNOSIS — Z1231 Encounter for screening mammogram for malignant neoplasm of breast: Secondary | ICD-10-CM

## 2023-07-16 DIAGNOSIS — E559 Vitamin D deficiency, unspecified: Secondary | ICD-10-CM

## 2023-07-16 DIAGNOSIS — Z131 Encounter for screening for diabetes mellitus: Secondary | ICD-10-CM | POA: Diagnosis not present

## 2023-07-16 LAB — COMPREHENSIVE METABOLIC PANEL
ALT: 24 U/L (ref 0–35)
AST: 22 U/L (ref 0–37)
Albumin: 4.3 g/dL (ref 3.5–5.2)
Alkaline Phosphatase: 99 U/L (ref 39–117)
BUN: 21 mg/dL (ref 6–23)
CO2: 31 meq/L (ref 19–32)
Calcium: 9.6 mg/dL (ref 8.4–10.5)
Chloride: 102 meq/L (ref 96–112)
Creatinine, Ser: 0.78 mg/dL (ref 0.40–1.20)
GFR: 77.33 mL/min (ref >60.00)
Glucose, Bld: 97 mg/dL (ref 70–99)
Potassium: 4.1 meq/L (ref 3.5–5.1)
Sodium: 140 meq/L (ref 135–145)
Total Bilirubin: 0.4 mg/dL (ref 0.2–1.2)
Total Protein: 7.5 g/dL (ref 6.0–8.3)

## 2023-07-16 LAB — LIPID PANEL
Cholesterol: 219 mg/dL — ABNORMAL HIGH (ref 0–200)
HDL: 63.2 mg/dL (ref >39.00)
LDL Cholesterol: 131 mg/dL — ABNORMAL HIGH (ref 0–99)
NonHDL: 155.73
Total CHOL/HDL Ratio: 3
Triglycerides: 124 mg/dL (ref 0.0–149.0)
VLDL: 24.8 mg/dL (ref 0.0–40.0)

## 2023-07-16 LAB — TSH: TSH: 1.52 u[IU]/mL (ref 0.35–5.50)

## 2023-07-16 LAB — HEMOGLOBIN A1C: Hgb A1c MFr Bld: 5.6 % (ref 4.6–6.5)

## 2023-07-16 LAB — VITAMIN D 25 HYDROXY (VIT D DEFICIENCY, FRACTURES): VITD: 28.17 ng/mL — ABNORMAL LOW (ref 30.00–100.00)

## 2023-07-16 NOTE — Progress Notes (Signed)
[facility] [address] [place] [address] Dept: [phone] Dept Fax: [phone]    Subjective:   Jane [name] 02/01/1956 07/16/2023  Chief Complaint  Patient presents with   Follow-up    HPI: Melodey Houlton presents today for re-assessment and management of chronic medical conditions. Discussed the use of AI scribe software for clinical note transcription with the patient, who gave verbal consent to proceed.  History of Present Illness   The patient, with a history of hypertension, hyperlipidemia, anxiety, vitamin d deficiency, and OA, presents for a follow-up visit. They report adherence to their lisinopril 40mg  daily for hypertension, with a blood pressure reading of 136/84 today. However, they admit to occasional non-compliance with their atorvastatin for hyperlipidemia.  The patient also has a history of anxiety, for which they have a prescription for lorazepam as needed. They report that their anxiety is well-controlled and they rarely need to take the lorazepam.  They have a history of osteoarthritis affecting their feet and right knee. They recently received injections in both feet and a gel injection in their right knee in April. They report that their knee is doing well, but their feet are still causing discomfort.   The patient also has a history of diverticulitis, which they manage with dietary changes. They report no recent flare-ups.  They have a concern about being borderline prediabetic in the past, and they express worry about potential complications, particularly in relation to their feet.  Lastly, the patient mentions needing a handicap form due to their foot and knee issues. They are considering using short-term disability.        The following portions of the patient's history were reviewed and updated as appropriate: past medical history, past surgical history, family history, social history,  allergies, medications, and problem list.   Patient Active Problem List   Diagnosis Date Noted   Mixed hyperlipidemia 02/13/2023   Degeneration of lumbar intervertebral disc 01/13/2023   Lumbar radiculopathy 12/27/2022   DDD (degenerative disc disease), cervical 11/15/2022   Osteoarthritis of right knee 10/07/2022   Diverticulosis 06/26/2022   History of colon polyps 06/26/2022   Internal hemorrhoids 06/26/2022   Hiatal hernia 06/14/2022   Anxiety disorder 01/08/2018   Plantar fasciitis, bilateral 02/29/2016   Metatarsal deformity 02/29/2016   Tenosynovitis of foot 02/29/2016   Panic disorder (episodic paroxysmal anxiety) 09/07/2015   Esophageal reflux 12/01/2013   Primary hypertension 12/01/2013   Past Medical History:  Diagnosis Date   Acid reflux    Anxiety disorder 01/08/2018   Arthritis    Hypertension    Reflux    Vasculitis (HCC)    Vitamin D deficiency 06/03/2014   Past Surgical History:  Procedure Laterality Date   COLONOSCOPY     TUBAL LIGATION     Family History  Problem Relation Age of Onset   Alcoholism Mother    Lung disease Maternal Grandmother    Diabetes Maternal Uncle    Colon cancer Neg Hx    Stomach cancer Neg Hx     Current Outpatient Medications:    acetaminophen (TYLENOL) 500 MG tablet, Take 1,000 mg by mouth every 6 (six) hours as needed for mild pain., Disp: , Rfl:    albuterol (VENTOLIN HFA) 108 (90 Base) MCG/ACT inhaler, Inhale 2 puffs into the lungs every 4 (four) hours as needed for wheezing or shortness of breath., Disp: 1 each, Rfl: 0   atorvastatin (LIPITOR) 20 MG tablet, Take 20 mg by mouth every evening.,  Disp: , Rfl:    B Complex Vitamins (VITAMIN-B COMPLEX) TABS, Take 1 tablet by mouth daily. , Disp: , Rfl:    Cholecalciferol 25 MCG (1000 UT) capsule, Take 1,000 Units by mouth daily. , Disp: , Rfl:    diclofenac (VOLTAREN) 75 MG EC tablet, Take 75 mg by mouth 2 (two) times daily., Disp: , Rfl:    ibuprofen (ADVIL) 200 MG tablet,  Take 400 mg by mouth every 6 (six) hours as needed for mild pain., Disp: , Rfl:    lisinopril (ZESTRIL) 40 MG tablet, Take 1 tablet (40 mg total) by mouth daily. daily., Disp: 90 tablet, Rfl: 1   LORazepam (ATIVAN) 0.5 MG tablet, Take 1 tablet (0.5 mg total) by mouth daily as needed for anxiety., Disp: 30 tablet, Rfl: 0   MAGNESIUM-CALCIUM-FOLIC ACID PO, Take 1 tablet by mouth daily., Disp: , Rfl:    polyethylene glycol (MIRALAX) 17 g packet, Take 17 g by mouth daily., Disp: 14 each, Rfl: 0   tiZANidine (ZANAFLEX) 4 MG tablet, Take 4 mg by mouth every 6 (six) hours as needed (neck pain)., Disp: , Rfl:  Allergies  Allergen Reactions   Iodinated Contrast Media Itching    Pt developed facial itching after contrast administration.   Shellfish Allergy Hives and Itching     ROS: A complete ROS was performed with pertinent positives/negatives noted in the HPI. The remainder of the ROS are negative.    Objective:   Today's Vitals   07/16/23 0806  BP: 136/84  Pulse: 63  Temp: 97.9 F (36.6 C)  TempSrc: Temporal  SpO2: 100%  Weight: 199 lb 3.2 oz (90.4 kg)  Height: 5\' 4"  (1.626 m)    GENERAL: Well-appearing, in NAD. Well nourished.  SKIN: Pink, warm and dry. No rash, lesion, ulceration, or ecchymoses.  NECK: Trachea midline. Full ROM w/o pain or tenderness. No lymphadenopathy. No thyromegaly or palpable masses.  RESPIRATORY: Chest wall symmetrical. Respirations even and non-labored. Breath sounds clear to auscultation bilaterally.  CARDIAC: S1, S2 present, regular rate and rhythm. Peripheral pulses 2+ bilaterally.   EXTREMITIES: Without clubbing, cyanosis, or edema.  PSYCH/MENTAL STATUS: Alert, oriented x 3. Cooperative, appropriate mood and affect.   Health Maintenance Due  Topic Date Due   MAMMOGRAM  Never done   Medicare Annual Wellness (AWV)  04/14/2020    No results found for any visits on 07/16/23.  The 10-year ASCVD risk score (Arnett DK, et al., 2019) is: 12.9%      Assessment & Plan:  Assessment and Plan    Hypertension Well controlled with Lisinopril 40mg  daily. -Continue Lisinopril 40mg  daily.  Hyperlipidemia Adherence to Atorvastatin is inconsistent. -Encourage consistent use of Atorvastatin. -Check lipid panel today.  Anxiety Well controlled with Lorazepam as needed. -Continue Lorazepam as needed.  Osteoarthritis Recent injections in feet and knees have provided some relief. -Continue current management.  Screening for Diabetes History of borderline prediabetes. -Check A1C today.   Vitamin D deficiency - check vit d level today  General Health Maintenance -Administer influenza vaccine today. -Order mammogram at [facility]. -Follow-up in 6 months or sooner if any issues arise.      Orders Placed This Encounter  Procedures   MM 3D SCREENING MAMMOGRAM BILATERAL BREAST    Standing Status:   Future    Standing Expiration Date:   07/15/2024    Scheduling Instructions:     [facility] in [place] (patient preference)    Order Specific Question:   Reason for Exam (SYMPTOM  OR DIAGNOSIS REQUIRED)    Answer:   screening for breast cancer    Order Specific Question:   Preferred imaging location?    Answer:   External    Comments:   [facility] in [place]   Flu Vaccine Trivalent High Dose (Fluad)   Comprehensive metabolic panel   TSH   Lipid panel   Hemoglobin A1C   VITAMIN D 25 Hydroxy (Vit-D Deficiency, Fractures)   No images are attached to the encounter or orders placed in the encounter. No orders of the defined types were placed in this encounter.   Return in about 6 months (around 01/13/2024) for Chronic Condition follow up.   Salvatore Decent, FNP

## 2023-07-24 ENCOUNTER — Ambulatory Visit: Payer: Medicare Other | Admitting: Gastroenterology

## 2023-07-25 ENCOUNTER — Encounter: Payer: Self-pay | Admitting: Internal Medicine

## 2023-07-25 ENCOUNTER — Ambulatory Visit: Payer: Medicare Other | Admitting: Internal Medicine

## 2023-07-25 VITALS — BP 130/70 | HR 80 | Temp 98.0°F | Ht 64.0 in | Wt 199.4 lb

## 2023-07-25 DIAGNOSIS — L309 Dermatitis, unspecified: Secondary | ICD-10-CM | POA: Diagnosis not present

## 2023-07-25 MED ORDER — TRIAMCINOLONE ACETONIDE 0.1 % EX CREA
1.0000 | TOPICAL_CREAM | Freq: Two times a day (BID) | CUTANEOUS | 1 refills | Status: AC
Start: 2023-07-25 — End: ?

## 2023-07-25 NOTE — Patient Instructions (Addendum)
Apply steroid cream. Once rubbed in well, then place aquaphor.

## 2023-07-25 NOTE — Progress Notes (Signed)
[facility] [address] [place] [address] Dept: [phone] Dept Fax: [phone]  Acute Care Office Visit  Subjective:   Jane [name] [birth year] 07/25/2023  Chief Complaint  Patient presents with   Rash    Noticed this morning while in the bathtub     HPI: Discussed the use of AI scribe software for clinical note transcription with the patient, who gave verbal consent to proceed.  History of Present Illness   The patient presents with a new rash on their legs, noticed this morning while preparing for work. They describe the rash as 'little red bumps' that are mildly itchy and feel like they are burning when exposed to warm water. They initially thought the rash might be a reaction to a medication injected into their feet by an orthopedic doctor, but this was ruled out. They have tried applying cortisone cream, which seemed to cool the area.       The following portions of the patient's history were reviewed and updated as appropriate: past medical history, past surgical history, family history, social history, allergies, medications, and problem list.   Patient Active Problem List   Diagnosis Date Noted   Mixed hyperlipidemia 02/13/2023   Degeneration of lumbar intervertebral disc 01/13/2023   Lumbar radiculopathy 12/27/2022   DDD (degenerative disc disease), cervical 11/15/2022   Osteoarthritis of right knee 10/07/2022   Diverticulosis 06/26/2022   History of colon polyps 06/26/2022   Internal hemorrhoids 06/26/2022   Hiatal hernia 06/14/2022   Anxiety disorder 01/08/2018   Plantar fasciitis, bilateral 02/29/2016   Metatarsal deformity 02/29/2016   Tenosynovitis of foot 02/29/2016   Panic disorder (episodic paroxysmal anxiety) 09/07/2015   Esophageal reflux 12/01/2013   Primary hypertension 12/01/2013   Past Medical History:  Diagnosis Date   Acid reflux    Anxiety disorder 01/08/2018   Arthritis     Hypertension    Reflux    Vasculitis (HCC)    Vitamin D deficiency 06/03/2014   Past Surgical History:  Procedure Laterality Date   COLONOSCOPY     TUBAL LIGATION     Family History  Problem Relation Age of Onset   Alcoholism Mother    Lung disease Maternal Grandmother    Diabetes Maternal Uncle    Colon cancer Neg Hx    Stomach cancer Neg Hx     Current Outpatient Medications:    acetaminophen (TYLENOL) 500 MG tablet, Take 1,000 mg by mouth every 6 (six) hours as needed for mild pain., Disp: , Rfl:    albuterol (VENTOLIN HFA) 108 (90 Base) MCG/ACT inhaler, Inhale 2 puffs into the lungs every 4 (four) hours as needed for wheezing or shortness of breath., Disp: 1 each, Rfl: 0   atorvastatin (LIPITOR) 20 MG tablet, Take 20 mg by mouth every evening., Disp: , Rfl:    B Complex Vitamins (VITAMIN-B COMPLEX) TABS, Take 1 tablet by mouth daily. , Disp: , Rfl:    Cholecalciferol 25 MCG (1000 UT) capsule, Take 1,000 Units by mouth daily. , Disp: , Rfl:    diclofenac (VOLTAREN) 75 MG EC tablet, Take 75 mg by mouth 2 (two) times daily., Disp: , Rfl:    ibuprofen (ADVIL) 200 MG tablet, Take 400 mg by mouth every 6 (six) hours as needed for mild pain., Disp: , Rfl:    lisinopril (ZESTRIL) 40 MG tablet, Take 1 tablet (40 mg total) by mouth daily. daily., Disp: 90 tablet, Rfl: 1   LORazepam (ATIVAN) 0.5 MG tablet,  Take 1 tablet (0.5 mg total) by mouth daily as needed for anxiety., Disp: 30 tablet, Rfl: 0   MAGNESIUM-CALCIUM-FOLIC ACID PO, Take 1 tablet by mouth daily., Disp: , Rfl:    polyethylene glycol (MIRALAX) 17 g packet, Take 17 g by mouth daily., Disp: 14 each, Rfl: 0   tiZANidine (ZANAFLEX) 4 MG tablet, Take 4 mg by mouth every 6 (six) hours as needed (neck pain)., Disp: , Rfl:    triamcinolone cream (KENALOG) 0.1 %, Apply 1 Application topically 2 (two) times daily., Disp: 45 g, Rfl: 1 Allergies  Allergen Reactions   Iodinated Contrast Media Itching    Pt developed facial itching after  contrast administration.   Shellfish Allergy Hives and Itching     ROS: A complete ROS was performed with pertinent positives/negatives noted in the HPI. The remainder of the ROS are negative.    Objective:   Today's Vitals   07/25/23 1039  BP: 130/70  Pulse: 80  Temp: 98 F (36.7 C)  TempSrc: Temporal  SpO2: 99%  Weight: 199 lb 6.4 oz (90.4 kg)  Height: 5\' 4"  (1.626 m)    GENERAL: Well-appearing, in NAD. Well nourished.  SKIN: Pink, warm and dry. No rash, lesion, ulceration, or ecchymoses. Localized erythematous papular rash to lateral aspect of R. And L. Lower leg  RESPIRATORY:Respirations even and non-labored.  EXTREMITIES: Without clubbing, cyanosis, or edema.  NEUROLOGIC: Steady, even gait.  PSYCH/MENTAL STATUS: Alert, oriented x 3. Cooperative, appropriate mood and affect.    No results found for any visits on 07/25/23.    Assessment & Plan:  Assessment and Plan    Dermatitis New onset of a rash with small red bumps on the legs, mild itching, and a burning sensation. No known cause or trigger. Not related to recent orthopedic injections. -Prescribe a topical steroid cream to be applied twice daily. -Advise to apply Aquaphor on top of the steroid cream after it has been fully absorbed to lock in moisture.      Meds ordered this encounter  Medications   triamcinolone cream (KENALOG) 0.1 %    Sig: Apply 1 Application topically 2 (two) times daily.    Dispense:  45 g    Refill:  1    Order Specific Question:   Supervising Provider    Answer:   Garnette Gunner [[id number]]   No orders of the defined types were placed in this encounter.  Lab Orders  No laboratory test(s) ordered today   No images are attached to the encounter or orders placed in the encounter.  Return if symptoms worsen or fail to improve.   Salvatore Decent, FNP

## 2023-08-25 ENCOUNTER — Emergency Department (HOSPITAL_COMMUNITY)
Admission: EM | Admit: 2023-08-25 | Discharge: 2023-08-26 | Discharge status: Against Medical Advice | Payer: Medicare Other | Attending: Emergency Medicine | Admitting: Emergency Medicine

## 2023-08-25 ENCOUNTER — Other Ambulatory Visit: Payer: Self-pay

## 2023-08-25 ENCOUNTER — Encounter (HOSPITAL_COMMUNITY): Payer: Self-pay

## 2023-08-25 DIAGNOSIS — K0889 Other specified disorders of teeth and supporting structures: Secondary | ICD-10-CM | POA: Diagnosis present

## 2023-08-25 DIAGNOSIS — Z5321 Procedure and treatment not carried out due to patient leaving prior to being seen by health care provider: Secondary | ICD-10-CM | POA: Diagnosis not present

## 2023-08-25 NOTE — ED Triage Notes (Signed)
 Pt arrived from home via POV c/o dental pain lower right side of jaw that began today. Pt states that she thinks she needs some abx. Pt states that she will call dentist to get seen, but fears it may take a while.

## 2023-08-26 ENCOUNTER — Encounter (HOSPITAL_COMMUNITY): Payer: Self-pay | Admitting: *Deleted

## 2023-08-26 ENCOUNTER — Other Ambulatory Visit: Payer: Self-pay

## 2023-08-26 ENCOUNTER — Ambulatory Visit (HOSPITAL_COMMUNITY)
Admission: EM | Admit: 2023-08-26 | Discharge: 2023-08-26 | Disposition: A | Discharge status: Home / Self Care | Payer: Medicare Other | Attending: Family Medicine | Admitting: Family Medicine

## 2023-08-26 DIAGNOSIS — K047 Periapical abscess without sinus: Secondary | ICD-10-CM

## 2023-08-26 MED ORDER — IBUPROFEN 600 MG PO TABS: 600.0000 mg | ORAL_TABLET | Freq: Three times a day (TID) | ORAL | 0 refills | Status: AC | PRN

## 2023-08-26 MED ORDER — AMOXICILLIN 875 MG PO TABS: 875.0000 mg | ORAL_TABLET | Freq: Two times a day (BID) | ORAL | 0 refills | Status: AC

## 2023-08-26 NOTE — ED Notes (Signed)
 Called for VS x3 - no answer

## 2023-08-26 NOTE — Discharge Instructions (Signed)
 Take amoxicillin 875 mg--1 tab twice daily for 7 days;  Take ibuprofen 600 mg--1 tab every 8 hours as needed for pain.

## 2023-08-26 NOTE — ED Provider Notes (Signed)
 [facility]    CSN: [id number] Arrival date & time: 08/26/23  9162      History   Chief Complaint Chief Complaint  Patient presents with   Dental Pain    HPI Jane [name] is a 68 y.o. female.    Dental Pain Here for pain in her right lower jaw.  She has had a bad tooth for a while.  No fever or cough  She took 400 mg of ibuprofen  +1 Tylenol  yesterday and it helped a little bit. She is allergic to iodine but no other medications  Last EGFR was 77  She is not taking any anticoagulants   Past Medical History:  Diagnosis Date   Acid reflux    Anxiety disorder 01/08/2018   Arthritis    Hypertension    Reflux    Vasculitis (HCC)    Vitamin D  deficiency 06/03/2014    Patient Active Problem List   Diagnosis Date Noted   Mixed hyperlipidemia 02/13/2023   Degeneration of lumbar intervertebral disc 01/13/2023   Lumbar radiculopathy 12/27/2022   DDD (degenerative disc disease), cervical 11/15/2022   Osteoarthritis of right knee 10/07/2022   Diverticulosis 06/26/2022   History of colon polyps 06/26/2022   Internal hemorrhoids 06/26/2022   Hiatal hernia 06/14/2022   Anxiety disorder 01/08/2018   Plantar fasciitis, bilateral 02/29/2016   Metatarsal deformity 02/29/2016   Tenosynovitis of foot 02/29/2016   Panic disorder (episodic paroxysmal anxiety) 09/07/2015   Esophageal reflux 12/01/2013   Primary hypertension 12/01/2013    Past Surgical History:  Procedure Laterality Date   COLONOSCOPY     TUBAL LIGATION      OB History   No obstetric history on file.      Home Medications    Prior to Admission medications   Medication Sig Start Date End Date Taking? Authorizing Provider  acetaminophen  (TYLENOL ) 500 MG tablet Take 1,000 mg by mouth every 6 (six) hours as needed for mild pain.   Yes [provider]  amoxicillin  (AMOXIL ) 875 MG tablet Take 1 tablet (875 mg total) by mouth 2 (two) times daily for 7 days. 08/26/23 09/02/23 Yes  Belia Febo, Sharlet POUR, MD  atorvastatin  (LIPITOR) 20 MG tablet Take 20 mg by mouth every evening.   Yes [provider]  B Complex Vitamins (VITAMIN-B COMPLEX) TABS Take 1 tablet by mouth daily.    Yes [provider]  ibuprofen  (ADVIL ) 600 MG tablet Take 1 tablet (600 mg total) by mouth every 8 (eight) hours as needed for up to 7 days (pain). 08/26/23 09/02/23 Yes Vonna Sharlet POUR, MD  lisinopril  (ZESTRIL ) 40 MG tablet Take 1 tablet (40 mg total) by mouth daily. daily. 05/13/23  Yes Billy Knee, FNP  LORazepam  (ATIVAN ) 0.5 MG tablet Take 1 tablet (0.5 mg total) by mouth daily as needed for anxiety. 04/01/23  Yes Billy Knee, FNP  albuterol  (VENTOLIN  HFA) 108 (90 Base) MCG/ACT inhaler Inhale 2 puffs into the lungs every 4 (four) hours as needed for wheezing or shortness of breath. 04/06/22   Rolfe Hartsell K, MD  Cholecalciferol 25 MCG (1000 UT) capsule Take 1,000 Units by mouth daily.     [provider]  MAGNESIUM -CALCIUM -FOLIC ACID PO Take 1 tablet by mouth daily.    [provider]  polyethylene glycol (MIRALAX ) 17 g packet Take 17 g by mouth daily. 03/14/23   Long, Fonda MATSU, MD  tiZANidine  (ZANAFLEX ) 4 MG tablet Take 4 mg by mouth every 6 (six) hours as needed (neck pain). 09/17/22  [provider]  triamcinolone  cream (KENALOG ) 0.1 % Apply 1 Application topically 2 (two) times daily. 07/25/23   Billy Knee, FNP    Family History Family History  Problem Relation Age of Onset   Alcoholism Mother    Lung disease Maternal Grandmother    Diabetes Maternal Uncle    Colon cancer Neg Hx    Stomach cancer Neg Hx     Social History Social History   Tobacco Use   Smoking status: Never   Smokeless tobacco: Never  Vaping Use   Vaping status: Never Used  Substance Use Topics   Alcohol  use: No    Alcohol /week: 0.0 standard drinks of alcohol    Drug use: No     Allergies   Iodinated contrast media and Shellfish allergy   Review of  Systems Review of Systems   Physical Exam Triage Vital Signs ED Triage Vitals  Encounter Vitals Group     BP 08/26/23 0909 (!) 157/72     Systolic BP Percentile --      Diastolic BP Percentile --      Pulse Rate 08/26/23 0909 60     Resp 08/26/23 0909 18     Temp 08/26/23 0909 97.9 F (36.6 C)     Temp src --      SpO2 08/26/23 0909 98 %     Weight --      Height --      Head Circumference --      Peak Flow --      Pain Score 08/26/23 0906 9     Pain Loc --      Pain Education --      Exclude from Growth Chart --    No data found.  Updated Vital Signs BP (!) 157/72   Pulse 60   Temp 97.9 F (36.6 C)   Resp 18   SpO2 98%   Visual Acuity Right Eye Distance:   Left Eye Distance:   Bilateral Distance:    Right Eye Near:   Left Eye Near:    Bilateral Near:     Physical Exam Vitals reviewed.  Constitutional:      General: She is not in acute distress.    Appearance: She is not ill-appearing, toxic-appearing or diaphoretic.  HENT:     Nose: Nose normal.     Mouth/Throat:     Mouth: Mucous membranes are moist.     Pharynx: No oropharyngeal exudate or posterior oropharyngeal erythema.     Comments: There is a little swelling at the right jaw below the right corner of the mouth.  There is some dental decay on examination of the oral cavity but no swelling inside.  No fluctuance Cardiovascular:     Rate and Rhythm: Normal rate and regular rhythm.  Pulmonary:     Effort: Pulmonary effort is normal.     Breath sounds: Normal breath sounds.  Musculoskeletal:     Cervical back: Neck supple.  Skin:    Coloration: Skin is not pale.  Neurological:     Mental Status: She is alert and oriented to person, place, and time.  Psychiatric:        Behavior: Behavior normal.      UC Treatments / Results  Labs (all labs ordered are listed, but only abnormal results are displayed) Labs Reviewed - No data to display  EKG   Radiology No results  found.  Procedures Procedures (including critical care time)  Medications Ordered in UC Medications -  No data to display  Initial Impression / Assessment and Plan / UC Course  I have reviewed the triage vital signs and the nursing notes.  Pertinent labs & imaging results that were available during my care of the patient were reviewed by me and considered in my medical decision making (see chart for details).     Amoxicillin  is sent in to treat the dental infection and ibuprofen  60 mg is sent in for pain.  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 Final Clinical Impressions(s) / UC Diagnoses   Final diagnoses:  Dental infection     Discharge Instructions      Take amoxicillin  875 mg--1 tab twice daily for 7 days;  Take ibuprofen  600 mg--1 tab every 8 hours as needed for pain.      ED Prescriptions     Medication Sig Dispense Auth. Provider   amoxicillin  (AMOXIL ) 875 MG tablet Take 1 tablet (875 mg total) by mouth 2 (two) times daily for 7 days. 14 tablet Lander Eslick, Sharlet POUR, MD   ibuprofen  (ADVIL ) 600 MG tablet Take 1 tablet (600 mg total) by mouth every 8 (eight) hours as needed for up to 7 days (pain). 15 tablet Caterin Tabares K, MD      PDMP not reviewed this encounter.   Vonna Sharlet POUR, MD 08/26/23 [phone]

## 2023-08-26 NOTE — ED Triage Notes (Signed)
 Pt reports dental pain on the RT for one day.

## 2023-08-27 ENCOUNTER — Ambulatory Visit: Payer: Medicare Other | Admitting: Internal Medicine

## 2023-08-28 ENCOUNTER — Encounter: Payer: Self-pay | Admitting: Internal Medicine

## 2023-08-28 ENCOUNTER — Ambulatory Visit: Payer: Self-pay | Admitting: Internal Medicine

## 2023-08-28 ENCOUNTER — Telehealth: Payer: Self-pay

## 2023-08-28 ENCOUNTER — Ambulatory Visit (INDEPENDENT_AMBULATORY_CARE_PROVIDER_SITE_OTHER): Payer: Medicare Other | Admitting: Internal Medicine

## 2023-08-28 VITALS — BP 138/74 | HR 74 | Temp 97.7°F | Ht 64.0 in | Wt 202.2 lb

## 2023-08-28 DIAGNOSIS — K0889 Other specified disorders of teeth and supporting structures: Secondary | ICD-10-CM | POA: Diagnosis not present

## 2023-08-28 DIAGNOSIS — B001 Herpesviral vesicular dermatitis: Secondary | ICD-10-CM | POA: Diagnosis not present

## 2023-08-28 NOTE — Progress Notes (Signed)
 [facility] PRIMARY CARE [facility] [address] [place] [address] Dept: [phone] Dept Fax: [phone]  Acute Care Office Visit  Subjective:   Jane [name] Aug 12, 1956 08/28/2023  Chief Complaint  Patient presents with   Rash    Started yesterday feeling numbness  Taking amoxicillian 875 mg    HPI: Discussed the use of AI scribe software for clinical note transcription with the patient, who gave verbal consent to proceed.  History of Present Illness   The patient presents with severe dental pain that began on Sunday. They initially sought care at the emergency room, but due to long wait times, they decided to leave and visit an urgent care clinic the following day. At the urgent care clinic, they were prescribed amoxicillin  875mg  twice daily and ibuprofen  for pain. Since starting the medications, they report swelling on the right side of their mouth and a sensation of numbness. They also note a small abscess-like bump near the affected tooth. They have been using Orajel for additional pain relief. They saw their dentist yesterday, and was told they need a root canal. They have a follow-up appointment with their dentist scheduled for next week for procedure.     She also reports cold sore to inner lower lip onset a couple days ago, she has been using oragel for pain relief.   The following portions of the patient's history were reviewed and updated as appropriate: past medical history, past surgical history, family history, social history, allergies, medications, and problem list.   Patient Active Problem List   Diagnosis Date Noted   Mixed hyperlipidemia 02/13/2023   Degeneration of lumbar intervertebral disc 01/13/2023   Lumbar radiculopathy 12/27/2022   DDD (degenerative disc disease), cervical 11/15/2022   Osteoarthritis of right knee 10/07/2022   Diverticulosis 06/26/2022   History of colon polyps 06/26/2022   Internal hemorrhoids  06/26/2022   Hiatal hernia 06/14/2022   Anxiety disorder 01/08/2018   Plantar fasciitis, bilateral 02/29/2016   Metatarsal deformity 02/29/2016   Tenosynovitis of foot 02/29/2016   Panic disorder (episodic paroxysmal anxiety) 09/07/2015   Esophageal reflux 12/01/2013   Primary hypertension 12/01/2013   Past Medical History:  Diagnosis Date   Acid reflux    Anxiety disorder 01/08/2018   Arthritis    Hypertension    Reflux    Vasculitis (HCC)    Vitamin D  deficiency 06/03/2014   Past Surgical History:  Procedure Laterality Date   COLONOSCOPY     TUBAL LIGATION     Family History  Problem Relation Age of Onset   Alcoholism Mother    Lung disease Maternal Grandmother    Diabetes Maternal Uncle    Colon cancer Neg Hx    Stomach cancer Neg Hx     Current Outpatient Medications:    acetaminophen  (TYLENOL ) 500 MG tablet, Take 1,000 mg by mouth every 6 (six) hours as needed for mild pain., Disp: , Rfl:    amoxicillin  (AMOXIL ) 875 MG tablet, Take 1 tablet (875 mg total) by mouth 2 (two) times daily for 7 days., Disp: 14 tablet, Rfl: 0   atorvastatin  (LIPITOR) 20 MG tablet, Take 20 mg by mouth every evening., Disp: , Rfl:    B Complex Vitamins (VITAMIN-B COMPLEX) TABS, Take 1 tablet by mouth daily. , Disp: , Rfl:    Cholecalciferol 25 MCG (1000 UT) capsule, Take 1,000 Units by mouth daily. , Disp: , Rfl:    ibuprofen  (ADVIL ) 600 MG tablet, Take 1 tablet (600 mg total) by mouth  every 8 (eight) hours as needed for up to 7 days (pain)., Disp: 15 tablet, Rfl: 0   lisinopril  (ZESTRIL ) 40 MG tablet, Take 1 tablet (40 mg total) by mouth daily. daily., Disp: 90 tablet, Rfl: 1   LORazepam  (ATIVAN ) 0.5 MG tablet, Take 1 tablet (0.5 mg total) by mouth daily as needed for anxiety., Disp: 30 tablet, Rfl: 0   MAGNESIUM -CALCIUM -FOLIC ACID PO, Take 1 tablet by mouth daily., Disp: , Rfl:    tiZANidine  (ZANAFLEX ) 4 MG tablet, Take 4 mg by mouth every 6 (six) hours as needed (neck pain)., Disp: , Rfl:     triamcinolone  cream (KENALOG ) 0.1 %, Apply 1 Application topically 2 (two) times daily., Disp: 45 g, Rfl: 1   albuterol  (VENTOLIN  HFA) 108 (90 Base) MCG/ACT inhaler, Inhale 2 puffs into the lungs every 4 (four) hours as needed for wheezing or shortness of breath. (Patient not taking: Reported on 08/28/2023), Disp: 1 each, Rfl: 0   amoxicillin -clavulanate (AUGMENTIN ) 875-125 MG tablet, Take 1 tablet by mouth 2 (two) times daily., Disp: , Rfl:    polyethylene glycol (MIRALAX ) 17 g packet, Take 17 g by mouth daily. (Patient not taking: Reported on 08/28/2023), Disp: 14 each, Rfl: 0 Allergies  Allergen Reactions   Iodinated Contrast Media Itching    Pt developed facial itching after contrast administration.   Shellfish Allergy Hives and Itching     ROS: A complete ROS was performed with pertinent positives/negatives noted in the HPI. The remainder of the ROS are negative.    Objective:   Today's Vitals   08/28/23 1543  BP: 138/74  Pulse: 74  Temp: 97.7 F (36.5 C)  TempSrc: Temporal  SpO2: 99%  Weight: 202 lb 3.2 oz (91.7 kg)  Height: 5' 4 (1.626 m)    GENERAL: Well-appearing, in NAD. Well nourished.  SKIN: Pink, warm and dry. Cold sore to right lower lip. HEENT:    HEAD: Normocephalic, non-traumatic.  EYES: Conjunctive pink without exudate. EARS: External ear w/o redness, swelling, masses, or lesions. EAC clear. TM's intact, translucent w/o bulging, appropriate landmarks visualized.  NOSE: Septum midline w/o deformity. Nares patent, mucosa pink and non-inflamed w/o drainage. No sinus tenderness.  THROAT: Uvula midline. Oropharynx clear. Tonsils non-inflamed w/o exudate. Mucus membranes pink and moist. Tooth decay to R. Upper jaw NECK: Trachea midline. Full ROM w/o pain or tenderness. No lymphadenopathy.  RESPIRATORY: Respirations even and non-labored.  NEUROLOGIC: No motor or sensory deficits. Steady, even gait.  PSYCH/MENTAL STATUS: Alert, oriented x 3. Cooperative, appropriate  mood and affect.    No results found for any visits on 08/28/23.    Assessment & Plan:  Assessment and Plan    Tooth Pain and Swelling Likely related to dental issues including a loose tooth and a tooth requiring a root canal. Currently on Amoxicillin  875mg  twice daily for possible infection. No signs of systemic infection noted on examination. -Continue Amoxicillin  875mg  twice daily as prescribed by urgent care. -Use Orajel as needed for local pain relief. -Use Tylenol  as needed for additional pain relief. -Follow up with dentist next week for further management including tooth extraction and root canal.  Cold Sore - Continue Oragel as needed      No orders of the defined types were placed in this encounter.  No orders of the defined types were placed in this encounter.  Lab Orders  No laboratory test(s) ordered today   No images are attached to the encounter or orders placed in the encounter.  Return if symptoms  worsen or fail to improve.   Rosina Senters, FNP

## 2023-08-28 NOTE — Telephone Encounter (Signed)
 Patient seen in office today.

## 2023-08-28 NOTE — Transitions of Care (Post Inpatient/ED Visit) (Cosign Needed)
 08/28/2023  Name: Jane [name] MRN: [id number] DOB: [birth year]  Today's TOC FU Call Status: Today's TOC FU Call Status:: Successful TOC FU Call Completed TOC FU Call Complete Date: 08/28/23 Patient's Name and Date of Birth confirmed.  Transition Care Management Follow-up Telephone Call Date of Discharge: 08/26/23 (Pt. states she left the ER due to long wait time.) Discharge Facility: [facility]) Type of Discharge:  (Pt left ER) How have you been since you were released from the hospital?: Better Any questions or concerns?: No  Items Reviewed: Did you receive and understand the discharge instructions provided?: No Medications obtained,verified, and reconciled?: No Any new allergies since your discharge?: No Dietary orders reviewed?: No Do you have support at home?: Yes People in Home: alone  Medications Reviewed Today: Medications Reviewed Today     Reviewed by Lafe Flonnie HERO, CMA (Certified Medical Assistant) on 08/28/23 at [phone]  Med List Status: <None>   Medication Order Taking? Sig Documenting Provider Last Dose Status Informant  acetaminophen  (TYLENOL ) 500 MG tablet [id number] Yes Take 1,000 mg by mouth every 6 (six) hours as needed for mild pain. [provider] Taking Active Self  albuterol  (VENTOLIN  HFA) 108 (90 Base) MCG/ACT inhaler [id number] Yes Inhale 2 puffs into the lungs every 4 (four) hours as needed for wheezing or shortness of breath. Vonna Sharlet POUR, MD Taking Active   amoxicillin  (AMOXIL ) 875 MG tablet [id number] Yes Take 1 tablet (875 mg total) by mouth 2 (two) times daily for 7 days. Vonna Sharlet POUR, MD Taking Active   amoxicillin -clavulanate (AUGMENTIN ) 875-125 MG tablet [id number] Yes Take 1 tablet by mouth 2 (two) times daily. [provider] Taking Active   atorvastatin  (LIPITOR) 20 MG tablet [id number] Yes Take 20 mg by mouth every evening. [provider] Taking Active Self  B Complex Vitamins (VITAMIN-B COMPLEX) TABS  [id number] Yes Take 1 tablet by mouth daily.  [provider] Taking Active Self           Med Note VIRGIA LAYMON JINNY Pablo Jan 14, 2017 10:07 PM)    Cholecalciferol 25 MCG (1000 UT) capsule [id number] Yes Take 1,000 Units by mouth daily.  [provider] Taking Active Self           Med Note VIRGIA LAYMON JINNY Pablo Jan 14, 2017 10:07 PM)    ibuprofen  (ADVIL ) 600 MG tablet [id number] Yes Take 1 tablet (600 mg total) by mouth every 8 (eight) hours as needed for up to 7 days (pain). Vonna Sharlet POUR, MD Taking Active   lisinopril  (ZESTRIL ) 40 MG tablet [id number] Yes Take 1 tablet (40 mg total) by mouth daily. daily. Billy Knee, FNP Taking Active   LORazepam  (ATIVAN ) 0.5 MG tablet [id number] Yes Take 1 tablet (0.5 mg total) by mouth daily as needed for anxiety. Billy Knee, FNP Taking Active   MAGNESIUM -CALCIUM -FOLIC ACID PO [id number] Yes Take 1 tablet by mouth daily. [provider] Taking Active Self  polyethylene glycol (MIRALAX ) 17 g packet [id number] Yes Take 17 g by mouth daily. Darra Fonda MATSU, MD Taking Active   tiZANidine  (ZANAFLEX ) 4 MG tablet [id number] Yes Take 4 mg by mouth every 6 (six) hours as needed (neck pain). [provider] Taking Active   triamcinolone  cream (KENALOG ) 0.1 % [id number] Yes Apply 1 Application topically 2 (two) times daily. Billy Knee, FNP Taking Active             Home Care and Equipment/Supplies: Were Home Health Services Ordered?: No  Any new equipment or medical supplies ordered?: No  Functional Questionnaire: Do you need assistance with bathing/showering or dressing?: No Do you need assistance with meal preparation?: No Do you need assistance with eating?: No Do you have difficulty maintaining continence: No Do you need assistance with getting out of bed/getting out of a chair/moving?: No Do you have difficulty managing or taking your medications?: No  Follow up appointments reviewed: PCP Follow-up  appointment confirmed?: No (Was able to make an appointment with [facility]) MD Provider Line Number:[phone] Given: No Specialist Hospital Follow-up appointment confirmed?: NA Do you need transportation to your follow-up appointment?: No Do you understand care options if your condition(s) worsen?: No-reviewed care options from [facility]    SIGNATURE Flonnie Norse, CMA

## 2023-08-28 NOTE — Telephone Encounter (Signed)
  Chief Complaint: itching Symptoms: itching and tingling after starting amoxicillin  Frequency: today Pertinent Negatives: Patient denies SOB Disposition: [] ED /[] Urgent Care (no appt availability in office) / [x] Appointment(In office/virtual)/ []  [place] [facility]/ [] Home Care/ [] Refused Recommended Disposition /[] [place] [facility]/ []  Follow-up with PCP Additional Notes: patient was started on amoxicillin  at [facility] on Monday for toothache. Patient developed itching and tingling today. Patient is concerned that she is allergic to the medication. Per protocol, appointment made for patient 08/28/2023 at 3:40 pm.  Patient verbalized understanding of plan and will be able to make the appointment time. All questions answered.   Copied from CRM #[id number]. Topic: Clinical - Pink Word Triage >> Aug 28, 2023  2:48 PM Suzette B wrote: Reason for Triage: patient went to Urgent care on Monday for toothache was given an antibiotics: numbness started on Tuesday, and today numbness and itching Reason for Disposition  Toothache present > 24 hours  Taking prescription medication that could cause itching (e.g., codeine/morphine/other opiates, aspirin)  Answer Assessment - Initial Assessment Questions 1. DESCRIPTION: Describe the itching you are having.     Patient started itching after using antibiotic 2. SEVERITY: How bad is it?    - MILD: Doesn't interfere with normal activities.   - MODERATE-SEVERE: Interferes with work, school, sleep, or other activities.      Mild 3. SCRATCHING: Are there any scratch marks? Bleeding?     No 4. ONSET: When did this begin?      Today 5. CAUSE: What do you think is causing the itching? (ask about swimming pools, pollen, animals, soaps, etc.)     Started Amoxicillin  on Monday 6. OTHER SYMPTOMS: Do you have any other symptoms?      No  Answer Assessment - Initial Assessment Questions 1. LOCATION: Which tooth is hurting?  (e.g., right-side/left-side,  upper/lower, front/back)     Right bottom 2. ONSET: When did the toothache start?  (e.g., hours, days)      Monday 3. SEVERITY: How bad is the toothache?  (Scale 1-10; mild, moderate or severe)   - MILD (1-3): doesn't interfere with chewing    - MODERATE (4-7): interferes with chewing, interferes with normal activities, awakens from sleep     - SEVERE (8-10): unable to eat, unable to do any normal activities, excruciating pain        Mild 4. SWELLING: Is there any visible swelling of your face?     No 5. OTHER SYMPTOMS: Do you have any other symptoms? (e.g., fever)     itching  Protocols used: Itching - Widespread-A-AH, Toothache-A-AH

## 2023-08-28 NOTE — Patient Instructions (Signed)
 Continue antibiotics as prescribed  Can take tylenol and oragel as needed for pain  Follow up with dentist as scheduled

## 2023-11-06 ENCOUNTER — Other Ambulatory Visit: Payer: Self-pay | Admitting: Internal Medicine

## 2023-11-06 NOTE — Telephone Encounter (Signed)
 Patient requesting discontinued med refill pending for approval

## 2023-11-06 NOTE — Telephone Encounter (Signed)
 Patient called and asked which pharmacy to send esomeprazole to and she says [facility] on [facility].   [facility] #[id number] [place] Otto, [place] - [phone] AT [facility] OF [facility] & [address] Phone: [phone]  Fax: [phone]

## 2023-11-06 NOTE — Telephone Encounter (Signed)
 Copied from CRM [id number]. Topic: Clinical - Medication Refill >> Nov 06, 2023 11:11 AM Denese Killings wrote: Most Recent Primary Care Visit:  Provider: Salvatore Decent  Department: [facility]  Visit Type: ACUTE  Date: 08/28/2023  Medication: esomeprazole (NEXIUM) 20 MG capsule   Has the patient contacted their pharmacy? Yes (Agent: If no, request that the patient contact the pharmacy for the refill. If patient does not wish to contact the pharmacy document the reason why and proceed with request.) (Agent: If yes, when and what did the pharmacy advise?) advised doctor never got back but it was her old doctor   Is this the correct pharmacy for this prescription? Yes If no, delete pharmacy and type the correct one.  This is the patient's preferred pharmacy:  [facility] #[id number] [place], [place] - [address] AT [facility] OF [facility] & [address] [place] [address] Phone: [phone] Fax: [phone]  [facility]ORE #[id number] [place] Otto,  - [address] AT [facility] OF [address] & [address] [place] [address] Phone: [phone] Fax: [phone]   Has the prescription been filled recently? No  Is the patient out of the medication? Yes 1 left  Has the patient been seen for an appointment in the last year OR does the patient have an upcoming appointment? Yes  Can we respond through MyChart? No  Agent: Please be advised that Rx refills may take up to 3 business days. We ask that you follow-up with your pharmacy.

## 2023-11-07 MED ORDER — ESOMEPRAZOLE MAGNESIUM 20 MG PO CPDR: 20.0000 mg | DELAYED_RELEASE_CAPSULE | Freq: Two times a day (BID) | ORAL | 1 refills | Status: AC

## 2023-12-11 LAB — HM MAMMOGRAPHY

## 2023-12-12 ENCOUNTER — Ambulatory Visit: Payer: Self-pay | Admitting: *Deleted

## 2023-12-12 ENCOUNTER — Emergency Department (HOSPITAL_COMMUNITY)
Admission: EM | Admit: 2023-12-12 | Discharge: 2023-12-12 | Discharge status: Against Medical Advice | Attending: Emergency Medicine | Admitting: Emergency Medicine

## 2023-12-12 ENCOUNTER — Encounter (HOSPITAL_COMMUNITY): Payer: Self-pay

## 2023-12-12 ENCOUNTER — Emergency Department (HOSPITAL_COMMUNITY)

## 2023-12-12 ENCOUNTER — Other Ambulatory Visit: Payer: Self-pay

## 2023-12-12 DIAGNOSIS — Z5321 Procedure and treatment not carried out due to patient leaving prior to being seen by health care provider: Secondary | ICD-10-CM | POA: Insufficient documentation

## 2023-12-12 DIAGNOSIS — R079 Chest pain, unspecified: Secondary | ICD-10-CM | POA: Diagnosis present

## 2023-12-12 LAB — CBC
HCT: 39.5 % (ref 36.0–46.0)
Hemoglobin: 12.8 g/dL (ref 12.0–15.0)
MCH: 30.1 pg (ref 26.0–34.0)
MCHC: 32.4 g/dL (ref 30.0–36.0)
MCV: 92.9 fL (ref 80.0–100.0)
Platelets: 201 10*3/uL (ref 150–400)
RBC: 4.25 MIL/uL (ref 3.87–5.11)
RDW: 13.2 % (ref 11.5–15.5)
WBC: 6.9 10*3/uL (ref 4.0–10.5)
nRBC: 0 % (ref 0.0–0.2)

## 2023-12-12 LAB — COMPREHENSIVE METABOLIC PANEL WITH GFR
ALT: 24 U/L (ref 0–44)
AST: 29 U/L (ref 15–41)
Albumin: 4 g/dL (ref 3.5–5.0)
Alkaline Phosphatase: 90 U/L (ref 38–126)
Anion gap: 11 (ref 5–15)
BUN: 13 mg/dL (ref 8–23)
CO2: 26 mmol/L (ref 22–32)
Calcium: 9.6 mg/dL (ref 8.9–10.3)
Chloride: 101 mmol/L (ref 98–111)
Creatinine, Ser: 0.87 mg/dL (ref 0.44–1.00)
GFR, Estimated: >60 mL/min (ref >60)
Glucose, Bld: 107 mg/dL — ABNORMAL HIGH (ref 70–99)
Potassium: 4.1 mmol/L (ref 3.5–5.1)
Sodium: 138 mmol/L (ref 135–145)
Total Bilirubin: 0.8 mg/dL (ref 0.0–1.2)
Total Protein: 7.4 g/dL (ref 6.5–8.1)

## 2023-12-12 LAB — TROPONIN I (HIGH SENSITIVITY)
Troponin I (High Sensitivity): 3 ng/L (ref <18)
Troponin I (High Sensitivity): 3 ng/L (ref <18)

## 2023-12-12 NOTE — ED Notes (Signed)
 Pt denies any chest pain or sob.  Skin is warm and dry.  Gait steady.

## 2023-12-12 NOTE — ED Triage Notes (Signed)
 Patient reports right chest/mid chest pressure "something just doesn't feel like it is suppose to be there".  Reports unsure if it is coming from her shoulder or neck.  Reports hx of bad GERD.  Denies sob dizziness sweating or pain in left side or arm.

## 2023-12-12 NOTE — Telephone Encounter (Signed)
   Chief Complaint: chest pain- R Symptoms: pain in chest, shoulder, back Frequency: comes and goes- can last over 1 hour Pertinent Negatives: Patient denies dizziness, nausea, vomiting, sweating, fever, difficulty breathing, cough Disposition: [x] ED /[] Urgent Care (no appt availability in office) / [] Appointment(In office/virtual)/ []  [place] [facility]/ [] Home Care/ [] Refused Recommended Disposition /[] [place] [facility]/ []  Follow-up with PCP Additional Notes: Due to high risk symptoms- ED recommended per protocol    Copied from CRM [id number]. Topic: Clinical - Red Word Triage >> Dec 12, 2023  9:59 AM Dimple Francis wrote: Red Word that prompted transfer to Nurse Triage: Bone spur on shoulder- right chest pain - tightness Reason for Disposition  Pain also in shoulder(s) or arm(s) or jaw  (Exception: Pain is clearly made worse by movement.)  Answer Assessment - Initial Assessment Questions 1. LOCATION: "Where does it hurt?"       R chest 2. RADIATION: "Does the pain go anywhere else?" (e.g., into neck, jaw, arms, back)     In back 3. ONSET: "When did the chest pain begin?" (Minutes, hours or days)      This morning 4. PATTERN: "Does the pain come and go, or has it been constant since it started?"  "Does it get worse with exertion?"      Comes and goes, stays the same with exertion 5. DURATION: "How long does it last" (e.g., seconds, minutes, hours)     Lasting- hours 6. SEVERITY: "How bad is the pain?"  (e.g., Scale 1-10; mild, moderate, or severe)    - MILD (1-3): doesn't interfere with normal activities     - MODERATE (4-7): interferes with normal activities or awakens from sleep    - SEVERE (8-10): excruciating pain, unable to do any normal activities       mild 7. CARDIAC RISK FACTORS: "Do you have any history of heart problems or risk factors for heart disease?" (e.g., angina, prior heart attack; diabetes, high blood pressure, high cholesterol, smoker, or strong family  history of heart disease)     High BP 8. PULMONARY RISK FACTORS: "Do you have any history of lung disease?"  (e.g., blood clots in lung, asthma, emphysema, birth control pills)     no 9. CAUSE: "What do you think is causing the chest pain?"     Unsure- back has been bothering her, food 10. OTHER SYMPTOMS: "Do you have any other symptoms?" (e.g., dizziness, nausea, vomiting, sweating, fever, difficulty breathing, cough)       none  Protocols used: Chest Pain-A-AH

## 2023-12-12 NOTE — Telephone Encounter (Signed)
 Patient seeking treatment at ED

## 2023-12-12 NOTE — ED Provider Triage Note (Signed)
 Emergency Medicine Provider Triage Evaluation Note  Charidy Cappelletti , a 68 y.o. female  was evaluated in triage.  Pt complains of chest pain.  Review of Systems  Chest pain  Physical Exam  BP (!) 163/83 (BP Location: Right Arm)   Pulse 85   Temp 98.5 F (36.9 C)   Resp 16   Ht 5\' 4"  (1.626 m)   Wt 91.6 kg   SpO2 100%   BMI 34.67 kg/m  Patient with chest pain no anterior tenderness.  Medical Decision Making  Medically screening exam initiated at 11:25 AM.  Appropriate orders placed.  Trynity Skousen was informed that the remainder of the evaluation will be completed by another provider, this initial triage assessment does not replace that evaluation, and the importance of remaining in the ED until their evaluation is complete.  Chest pain.  Mid chest.  Does have some shoulder involvement.  No abdominal tenderness. EKG reassuring.   Mozell Arias, MD 12/12/23 1126

## 2023-12-12 NOTE — ED Notes (Addendum)
 Pt leaving AMA.

## 2023-12-13 ENCOUNTER — Encounter: Payer: Self-pay | Admitting: Internal Medicine

## 2023-12-13 ENCOUNTER — Ambulatory Visit: Payer: Self-pay

## 2023-12-13 NOTE — Telephone Encounter (Signed)
 Patient calling in regarding Mammogram results from 12/11/23. Patient advised results showing still pending at this time. Patient verbalized understanding.     Copied from CRM [id number]. Topic: Clinical - Lab/Test Results >> Dec 13, 2023  8:25 AM Ovid Blow wrote: Reason for CRM: Patient called in for a more in depth reading of lab results Reason for Disposition  General information question, no triage required and triager able to answer question  Answer Assessment - Initial Assessment Questions 1. REASON FOR CALL or QUESTION: "What is your reason for calling today?" or "How can I best help you?" or "What question do you have that I can help answer?"     Patient calling in regarding Mammogram results from 12/11/23. Patient advised results showing still pending at this time. Patient verbalized understanding.  Protocols used: Information Only Call - No Triage-A-AH

## 2023-12-16 ENCOUNTER — Telehealth: Payer: Self-pay

## 2023-12-16 NOTE — Transitions of Care (Post Inpatient/ED Visit) (Signed)
 12/16/2023  Name: Jane [name] MRN: [id number] DOB: [birth year]  Today's TOC FU Call Status: Today's TOC FU Call Status:: Successful TOC FU Call Completed TOC FU Call Complete Date: 12/16/23 Patient's Name and Date of Birth confirmed.  Transition Care Management Follow-up Telephone Call Date of Discharge: 12/14/23 Discharge Facility: Other ([facility]) Name of Other ([facility]) Discharge Facility: [facility] Type of Discharge: Emergency Department Reason for ED Visit: Other: (abdominal pain and constipation) How have you been since you were released from the hospital?: Same Any questions or concerns?: Yes Patient Questions/Concerns:: Pt C/O of abdominal sorness and pain present Patient Questions/Concerns Addressed: Provided Patient Educational Materials  Items Reviewed: Did you receive and understand the discharge instructions provided?: Yes Medications obtained,verified, and reconciled?: Yes (Medications Reviewed) Any new allergies since your discharge?: No Dietary orders reviewed?: NA Do you have support at home?: No  Medications Reviewed Today: Medications Reviewed Today     Reviewed by Lonie Roa, CMA (Certified Medical Assistant) on 12/16/23 at 1405  Med List Status: <None>   Medication Order Taking? Sig Documenting Provider Last Dose Status Informant  acetaminophen  (TYLENOL ) 500 MG tablet [id number] Yes Take 1,000 mg by mouth every 6 (six) hours as needed for mild pain. [provider] Taking Active Self  albuterol  (VENTOLIN  HFA) 108 (90 Base) MCG/ACT inhaler [id number] No Inhale 2 puffs into the lungs every 4 (four) hours as needed for wheezing or shortness of breath.  Patient not taking: Reported on 12/16/2023   Ann Keto, MD Not Taking Active   aluminum-magnesium  hydroxide 200-200 MG/5ML suspension [id number] Yes Take 30 mLs by mouth. [provider] Taking Active   amoxicillin -clavulanate (AUGMENTIN ) 875-125 MG tablet [id number] Yes  Take 1 tablet by mouth 2 (two) times daily. [provider] Taking Active   atorvastatin (LIPITOR) 20 MG tablet [id number] Yes Take 20 mg by mouth every evening. [provider] Taking Active Self  B Complex Vitamins (VITAMIN-B COMPLEX) TABS [id number] Yes Take 1 tablet by mouth daily.  [provider] Taking Active Self           Med Note Sherlynn Ditty Jan 14, 2017 10:07 PM)    Cholecalciferol 25 MCG (1000 UT) capsule [id number] Yes Take 1,000 Units by mouth daily.  [provider] Taking Active Self           Med Note Sherlynn Ditty Jan 14, 2017 10:07 PM)    esomeprazole  (NEXIUM ) 20 MG capsule [id number] Yes Take 1 capsule (20 mg total) by mouth 2 (two) times daily before a meal. Gavin Kast, FNP Taking Active   hydrochlorothiazide  (MICROZIDE ) 12.5 MG capsule [id number] Yes Take 12.5 mg by mouth daily. [provider] Taking Active   lisinopril  (ZESTRIL ) 40 MG tablet [id number] Yes Take 1 tablet (40 mg total) by mouth daily. daily. Gavin Kast, FNP Taking Active   LORazepam  (ATIVAN ) 0.5 MG tablet [id number] Yes Take 1 tablet (0.5 mg total) by mouth daily as needed for anxiety. Gavin Kast, FNP Taking Active   MAGNESIUM  PO [id number] Yes Take 250 mg by mouth. [provider] Taking Active   MAGNESIUM -CALCIUM-FOLIC ACID PO [id number] Yes Take 1 tablet by mouth daily. [provider] Taking Active Self  meloxicam  (MOBIC ) 7.5 MG tablet [id number] Yes Take 1 tablet PO twice a day for 2 weeks and then PRN [provider] Taking Active   metroNIDAZOLE (METROGEL) 0.75 % gel [id number] Yes APPLY THIN LAYER TOPICALLY TO THE AFFECTED AREA TWICE  DAILY IN THE MORNING AND IN THE EVENING [provider] Taking Active   polyethylene glycol (MIRALAX ) 17 g packet [id number] Yes Take 17 g by mouth daily. Long, Shereen Dike, MD Taking Active   tiZANidine (ZANAFLEX) 4 MG tablet [id number] Yes Take 4 mg by mouth every 6 (six)  hours as needed (neck pain). [provider] Taking Active   triamcinolone  cream (KENALOG ) 0.1 % [id number] Yes Apply 1 Application topically 2 (two) times daily. Gavin Kast, FNP Taking Active             Home Care and Equipment/Supplies: Were Home Health Services Ordered?: NA Any new equipment or medical supplies ordered?: NA  Functional Questionnaire: Do you need assistance with bathing/showering or dressing?: No Do you need assistance with meal preparation?: No Do you need assistance with eating?: No Do you have difficulty maintaining continence: No Do you need assistance with getting out of bed/getting out of a chair/moving?: No Do you have difficulty managing or taking your medications?: No  Follow up appointments reviewed: PCP Follow-up appointment confirmed?: Yes Date of PCP follow-up appointment?: 12/18/23 Follow-up Provider: Gavin Kast [facility] Follow-up appointment confirmed?: NA Do you need transportation to your follow-up appointment?: No Do you understand care options if your condition(s) worsen?: Yes-patient verbalized understanding    SIGNATURE [name], RMA

## 2023-12-18 ENCOUNTER — Encounter: Payer: Self-pay | Admitting: Internal Medicine

## 2023-12-18 ENCOUNTER — Ambulatory Visit (INDEPENDENT_AMBULATORY_CARE_PROVIDER_SITE_OTHER): Admitting: Internal Medicine

## 2023-12-18 VITALS — BP 110/80 | HR 78 | Temp 97.9°F | Ht 64.0 in | Wt 207.4 lb

## 2023-12-18 DIAGNOSIS — K219 Gastro-esophageal reflux disease without esophagitis: Secondary | ICD-10-CM | POA: Diagnosis not present

## 2023-12-18 DIAGNOSIS — K5900 Constipation, unspecified: Secondary | ICD-10-CM | POA: Diagnosis not present

## 2023-12-18 NOTE — Progress Notes (Signed)
 [facility] PRIMARY [facility]ARE [facility] [address] [place] [address] Dept: [phone] Dept Fax: [phone]  Acute Care Office Visit  Subjective:   Jane [name] 02-18-1956 12/18/2023  Chief Complaint  Patient presents with   GI Problem    Not feeling bad today     HPI: Jane [name] is a 68 yo F who presents for follow up complaining of intermittent abdominal pain, gas, and constipation. Was seen at urgent care recently. XR abdomen did show stool burden and nonobstructive gas pattern.  Did take Miralax  which did help alleviate pain. Is taking stool softeners which are helping stool passage. Is also doing prune juice.   She also has questions about GERD. She has Rx for esomeprazole  20mg  BID.She takes when needed but sometimes certain foods trigger worsening of symptoms and the 1 tablet does not help.    The following portions of the patient's history were reviewed and updated as appropriate: past medical history, past surgical history, family history, social history, allergies, medications, and problem list.   Patient Active Problem List   Diagnosis Date Noted   Mixed hyperlipidemia 02/13/2023   Degeneration of lumbar intervertebral disc 01/13/2023   Lumbar radiculopathy 12/27/2022   DDD (degenerative disc disease), cervical 11/15/2022   Osteoarthritis of right knee 10/07/2022   Diverticulosis 06/26/2022   History of colon polyps 06/26/2022   Internal hemorrhoids 06/26/2022   Hiatal hernia 06/14/2022   Anxiety disorder 01/08/2018   Plantar fasciitis, bilateral 02/29/2016   Metatarsal deformity 02/29/2016   Tenosynovitis of foot 02/29/2016   Panic disorder (episodic paroxysmal anxiety) 09/07/2015   Esophageal reflux 12/01/2013   Primary hypertension 12/01/2013   Past Medical History:  Diagnosis Date   Acid reflux    Anxiety disorder 01/08/2018   Arthritis    Hypertension    Reflux    Vasculitis (HCC)    Vitamin D  deficiency  06/03/2014   Past Surgical History:  Procedure Laterality Date   COLONOSCOPY     TUBAL LIGATION     Family History  Problem Relation Age of Onset   Alcoholism Mother    Lung disease Maternal Grandmother    Diabetes Maternal Uncle    Colon cancer Neg Hx    Stomach cancer Neg Hx     Current Outpatient Medications:    aluminum-magnesium  hydroxide 200-200 MG/5ML suspension, Take 30 mLs by mouth., Disp: , Rfl:    atorvastatin (LIPITOR) 20 MG tablet, Take 20 mg by mouth every evening., Disp: , Rfl:    B Complex Vitamins (VITAMIN-B COMPLEX) TABS, Take 1 tablet by mouth daily. , Disp: , Rfl:    Cholecalciferol 25 MCG (1000 UT) capsule, Take 1,000 Units by mouth daily. , Disp: , Rfl:    esomeprazole  (NEXIUM ) 20 MG capsule, Take 1 capsule (20 mg total) by mouth 2 (two) times daily before a meal., Disp: 180 capsule, Rfl: 1   hydrochlorothiazide  (MICROZIDE ) 12.5 MG capsule, Take 12.5 mg by mouth daily., Disp: , Rfl:    lisinopril  (ZESTRIL ) 40 MG tablet, Take 1 tablet (40 mg total) by mouth daily. daily., Disp: 90 tablet, Rfl: 1   LORazepam  (ATIVAN ) 0.5 MG tablet, Take 1 tablet (0.5 mg total) by mouth daily as needed for anxiety., Disp: 30 tablet, Rfl: 0   MAGNESIUM  PO, Take 250 mg by mouth., Disp: , Rfl:    MAGNESIUM -CALCIUM-FOLIC ACID PO, Take 1 tablet by mouth daily., Disp: , Rfl:    metroNIDAZOLE (METROGEL) 0.75 % gel, APPLY THIN LAYER TOPICALLY TO THE AFFECTED AREA  TWICE DAILY IN THE MORNING AND IN THE EVENING, Disp: , Rfl:    polyethylene glycol (MIRALAX ) 17 g packet, Take 17 g by mouth daily., Disp: 14 each, Rfl: 0   tiZANidine (ZANAFLEX) 4 MG tablet, Take 4 mg by mouth every 6 (six) hours as needed (neck pain)., Disp: , Rfl:    triamcinolone  cream (KENALOG ) 0.1 %, Apply 1 Application topically 2 (two) times daily., Disp: 45 g, Rfl: 1   acetaminophen  (TYLENOL ) 500 MG tablet, Take 1,000 mg by mouth every 6 (six) hours as needed for mild pain. (Patient not taking: Reported on 12/18/2023), Disp:  , Rfl:    albuterol  (VENTOLIN  HFA) 108 (90 Base) MCG/ACT inhaler, Inhale 2 puffs into the lungs every 4 (four) hours as needed for wheezing or shortness of breath. (Patient not taking: Reported on 08/28/2023), Disp: 1 each, Rfl: 0   meloxicam  (MOBIC ) 7.5 MG tablet, Take 1 tablet PO twice a day for 2 weeks and then PRN (Patient not taking: Reported on 12/18/2023), Disp: , Rfl:  Allergies  Allergen Reactions   Iodinated Contrast Media Itching    Pt developed facial itching after contrast administration.   Shellfish Allergy Hives and Itching     ROS: A complete ROS was performed with pertinent positives/negatives noted in the HPI. The remainder of the ROS are negative.    Objective:   Today's Vitals   12/18/23 1542  BP: 110/80  Pulse: 78  Temp: 97.9 F (36.6 C)  TempSrc: Temporal  SpO2: 99%  Weight: 207 lb 6.4 oz (94.1 kg)  Height: 5\' 4"  (1.626 m)    GENERAL: Well-appearing, in NAD. Well nourished.  SKIN: Pink, warm and dry. No rash, lesion, ulceration, or ecchymoses.  NECK: Trachea midline. Full ROM w/o pain or tenderness. No lymphadenopathy.  RESPIRATORY: Chest wall symmetrical. Respirations even and non-labored. Breath sounds clear to auscultation bilaterally.  CARDIAC: S1, S2 present, regular rate and rhythm. Peripheral pulses 2+ bilaterally.  GI: Abdomen soft, non-tender. Normoactive bowel sounds. No rebound tenderness. No CVA tenderness.  EXTREMITIES: Without clubbing, cyanosis, or edema.  NEUROLOGIC: Steady, even gait.  PSYCH/MENTAL STATUS: Alert, oriented x 3. Cooperative, appropriate mood and affect.    No results found for any visits on 12/18/23.    Assessment & Plan:  1. Gastroesophageal reflux disease, unspecified whether esophagitis present (Primary) - advised she can take up to 2 tablets per day for GERD  - Supplement with TUMS if needed  - avoid trigger foods   2. Constipation, unspecified constipation type - continue stool softener  - discussed high fiber  foods  - discussed using miralax  once daily as needed   No orders of the defined types were placed in this encounter.  No orders of the defined types were placed in this encounter.  Lab Orders  No laboratory test(s) ordered today   No images are attached to the encounter or orders placed in the encounter.  Return in about 3 months (around 03/18/2024) for Chronic Condition follow up.   Gavin Kast, FNP

## 2024-02-27 ENCOUNTER — Ambulatory Visit (INDEPENDENT_AMBULATORY_CARE_PROVIDER_SITE_OTHER): Admitting: Family Medicine

## 2024-02-27 ENCOUNTER — Encounter: Payer: Self-pay | Admitting: Family Medicine

## 2024-02-27 ENCOUNTER — Ambulatory Visit: Payer: Self-pay

## 2024-02-27 VITALS — BP 130/78 | HR 85 | Temp 97.3°F | Ht 64.0 in | Wt 215.8 lb

## 2024-02-27 DIAGNOSIS — J300 Vasomotor rhinitis: Secondary | ICD-10-CM | POA: Insufficient documentation

## 2024-02-27 DIAGNOSIS — J4520 Mild intermittent asthma, uncomplicated: Secondary | ICD-10-CM | POA: Diagnosis not present

## 2024-02-27 MED ORDER — ALBUTEROL SULFATE HFA 108 (90 BASE) MCG/ACT IN AERS: 2.0000 | INHALATION_SPRAY | RESPIRATORY_TRACT | 0 refills | Status: AC | PRN

## 2024-02-27 NOTE — Progress Notes (Signed)
 Established Patient Office Visit   Subjective:  Patient ID: Mirabella Hilario, female    DOB: 1956-05-20  Age: 68 y.o. MRN: [id number]  Chief Complaint  Patient presents with   Cough    C/o having cough, fatigue, SOB x 3-4 days.  Has taken Mucinex.       Cough Associated symptoms include wheezing. Pertinent negatives include no chills, eye redness, fever, headaches, myalgias, rash, sore throat or shortness of breath.   Encounter Diagnoses  Name Primary?   Vasomotor rhinitis Yes   Mild intermittent reactive airway disease without complication    2 to 3-day history of dryness in her nose, congestion, postnasal drip and cough associated with mild wheezing.  Denies fevers chills, headaches, difficulty breathing or asthma history.  There is a family history of asthma.  She has used an inhaler in the past for wheezing and needs a refill.   Review of Systems  Constitutional: Negative.  Negative for chills and fever.  HENT:  Positive for congestion. Negative for sinus pain and sore throat.   Eyes:  Negative for blurred vision, discharge and redness.  Respiratory:  Positive for cough and wheezing. Negative for sputum production and shortness of breath.   Cardiovascular: Negative.   Gastrointestinal:  Negative for abdominal pain.  Genitourinary: Negative.   Musculoskeletal: Negative.  Negative for myalgias.  Skin:  Negative for rash.  Neurological:  Negative for tingling, loss of consciousness, weakness and headaches.  Endo/Heme/Allergies:  Negative for polydipsia.     Current Outpatient Medications:    acetaminophen  (TYLENOL ) 500 MG tablet, Take 1,000 mg by mouth every 6 (six) hours as needed for mild pain., Disp: , Rfl:    aluminum-magnesium  hydroxide 200-200 MG/5ML suspension, Take 30 mLs by mouth., Disp: , Rfl:    atorvastatin (LIPITOR) 20 MG tablet, Take 20 mg by mouth every evening., Disp: , Rfl:    B Complex Vitamins (VITAMIN-B COMPLEX) TABS, Take 1 tablet by mouth daily. , Disp: ,  Rfl:    Cholecalciferol 25 MCG (1000 UT) capsule, Take 1,000 Units by mouth daily. , Disp: , Rfl:    esomeprazole  (NEXIUM ) 20 MG capsule, Take 1 capsule (20 mg total) by mouth 2 (two) times daily before a meal., Disp: 180 capsule, Rfl: 1   hydrochlorothiazide  (MICROZIDE ) 12.5 MG capsule, Take 12.5 mg by mouth daily., Disp: , Rfl:    lisinopril  (ZESTRIL ) 40 MG tablet, Take 1 tablet (40 mg total) by mouth daily. daily., Disp: 90 tablet, Rfl: 1   LORazepam  (ATIVAN ) 0.5 MG tablet, Take 1 tablet (0.5 mg total) by mouth daily as needed for anxiety., Disp: 30 tablet, Rfl: 0   MAGNESIUM -CALCIUM-FOLIC ACID PO, Take 1 tablet by mouth daily., Disp: , Rfl:    meloxicam  (MOBIC ) 7.5 MG tablet, Take 1 tablet PO twice a day for 2 weeks and then PRN, Disp: , Rfl:    metroNIDAZOLE (METROGEL) 0.75 % gel, APPLY THIN LAYER TOPICALLY TO THE AFFECTED AREA TWICE DAILY IN THE MORNING AND IN THE EVENING, Disp: , Rfl:    polyethylene glycol (MIRALAX ) 17 g packet, Take 17 g by mouth daily., Disp: 14 each, Rfl: 0   tiZANidine (ZANAFLEX) 4 MG tablet, Take 4 mg by mouth every 6 (six) hours as needed (neck pain)., Disp: , Rfl:    triamcinolone  cream (KENALOG ) 0.1 %, Apply 1 Application topically 2 (two) times daily., Disp: 45 g, Rfl: 1   albuterol  (VENTOLIN  HFA) 108 (90 Base) MCG/ACT inhaler, Inhale 2 puffs into the lungs every 4 (four)  hours as needed for wheezing or shortness of breath., Disp: 8.5 each, Rfl: 0   Objective:     BP 130/78   Pulse 85   Temp (!) 97.3 F (36.3 C) (Temporal)   Ht 5' 4 (1.626 m)   Wt 215 lb 12.8 oz (97.9 kg)   SpO2 99%   BMI 37.04 kg/m    Physical Exam Constitutional:      General: She is not in acute distress.    Appearance: Normal appearance. She is not ill-appearing, toxic-appearing or diaphoretic.  HENT:     Head: Normocephalic and atraumatic.     Right Ear: Tympanic membrane, ear canal and external ear normal.     Left Ear: Tympanic membrane, ear canal and external ear normal.      Mouth/Throat:     Mouth: Mucous membranes are moist.     Pharynx: Oropharynx is clear. No oropharyngeal exudate or posterior oropharyngeal erythema.  Eyes:     General: No scleral icterus.       Right eye: No discharge.        Left eye: No discharge.     Extraocular Movements: Extraocular movements intact.     Conjunctiva/sclera: Conjunctivae normal.     Pupils: Pupils are equal, round, and reactive to light.  Cardiovascular:     Rate and Rhythm: Normal rate and regular rhythm.  Pulmonary:     Effort: Pulmonary effort is normal. No respiratory distress.     Breath sounds: Normal breath sounds. No wheezing or rales.  Abdominal:     General: Bowel sounds are normal.  Musculoskeletal:     Cervical back: No rigidity or tenderness.  Lymphadenopathy:     Cervical: No cervical adenopathy.  Skin:    General: Skin is warm and dry.  Neurological:     Mental Status: She is alert and oriented to person, place, and time.  Psychiatric:        Mood and Affect: Mood normal.        Behavior: Behavior normal.      No results found for any visits on 02/27/24.    The 10-year ASCVD risk score (Arnett DK, et al., 2019) is: 13.5%    Assessment & Plan:   Vasomotor rhinitis  Mild intermittent reactive airway disease without complication -     Albuterol  Sulfate HFA; Inhale 2 puffs into the lungs every 4 (four) hours as needed for wheezing or shortness of breath.  Dispense: 8.5 each; Refill: 0    Return for Use salt water nose spray liberally.  May add Zyrtec.  Follow-up in 1 week if not improving.    Elsie Sim Lent, MD

## 2024-02-27 NOTE — Telephone Encounter (Signed)
 FYI

## 2024-02-27 NOTE — Telephone Encounter (Signed)
  FYI Only or Action Required?: FYI only for provider.  Patient was last seen in primary care on 12/18/2023 by Billy Knee, FNP.  Called Nurse Triage reporting Cough.  Symptoms began several days ago.  Interventions attempted: Nothing.  Symptoms are: unchanged.  Triage Disposition: No disposition on file.  Patient/caregiver understands and will follow disposition?:   Apt today Copied from CRM [id number]. Topic: Clinical - Red Word Triage >> Feb 27, 2024 12:00 PM Suzen RAMAN wrote: Red Word that prompted transfer to Nurse Triage: Extreme fatigue, constipation and  productive cough Reason for Disposition  [1] MILD difficulty breathing (e.g., minimal/no SOB at rest, SOB with walking, pulse < 100) AND [2] still present when not coughing  Answer Assessment - Initial Assessment Questions 1. ONSET: When did the cough begin?      Few days ago 2. SEVERITY: How bad is the cough today?      mild 3. SPUTUM: Describe the color of your sputum (e.g., none, dry cough; clear, white, yellow, green)     clear 4. HEMOPTYSIS: Are you coughing up any blood? If Yes, ask: How much? (e.g., flecks, streaks, tablespoons, etc.)     no 5. DIFFICULTY BREATHING: Are you having difficulty breathing? If Yes, ask: How bad is it? (e.g., mild, moderate, severe)      A little 6. FEVER: Do you have a fever? If Yes, ask: What is your temperature, how was it measured, and when did it start?     no 7. CARDIAC HISTORY: Do you have any history of heart disease? (e.g., heart attack, congestive heart failure)      no 8. LUNG HISTORY: Do you have any history of lung disease?  (e.g., pulmonary embolus, asthma, emphysema)     no 9. PE RISK FACTORS: Do you have a history of blood clots? (or: recent major surgery, recent prolonged travel, bedridden)     no 10. OTHER SYMPTOMS: Do you have any other symptoms? (e.g., runny nose, wheezing, chest pain)       denies 11. PREGNANCY: Is there any chance you  are pregnant? When was your last menstrual period?       na 12. TRAVEL: Have you traveled out of the country in the last month? (e.g., travel history, exposures)       no  Protocols used: Cough - Acute Non-Productive-A-AH

## 2024-03-18 ENCOUNTER — Ambulatory Visit (INDEPENDENT_AMBULATORY_CARE_PROVIDER_SITE_OTHER): Admitting: Internal Medicine

## 2024-03-18 ENCOUNTER — Encounter: Payer: Self-pay | Admitting: Internal Medicine

## 2024-03-18 VITALS — BP 134/82 | HR 65 | Temp 98.0°F | Ht 64.0 in | Wt 216.2 lb

## 2024-03-18 DIAGNOSIS — F411 Generalized anxiety disorder: Secondary | ICD-10-CM | POA: Diagnosis not present

## 2024-03-18 DIAGNOSIS — I10 Essential (primary) hypertension: Secondary | ICD-10-CM | POA: Diagnosis not present

## 2024-03-18 DIAGNOSIS — L719 Rosacea, unspecified: Secondary | ICD-10-CM

## 2024-03-18 DIAGNOSIS — E782 Mixed hyperlipidemia: Secondary | ICD-10-CM

## 2024-03-18 DIAGNOSIS — M5136 Other intervertebral disc degeneration, lumbar region with discogenic back pain only: Secondary | ICD-10-CM | POA: Diagnosis not present

## 2024-03-18 DIAGNOSIS — E559 Vitamin D deficiency, unspecified: Secondary | ICD-10-CM | POA: Diagnosis not present

## 2024-03-18 LAB — BASIC METABOLIC PANEL WITH GFR
BUN: 19 mg/dL (ref 6–23)
CO2: 31 meq/L (ref 19–32)
Calcium: 9.4 mg/dL (ref 8.4–10.5)
Chloride: 102 meq/L (ref 96–112)
Creatinine, Ser: 0.77 mg/dL (ref 0.40–1.20)
GFR: 78.16 mL/min (ref >60.00)
Glucose, Bld: 91 mg/dL (ref 70–99)
Potassium: 3.9 meq/L (ref 3.5–5.1)
Sodium: 140 meq/L (ref 135–145)

## 2024-03-18 LAB — LIPID PANEL
Cholesterol: 173 mg/dL (ref 0–200)
HDL: 61.3 mg/dL (ref >39.00)
LDL Cholesterol: 89 mg/dL (ref 0–99)
NonHDL: 112.05
Total CHOL/HDL Ratio: 3
Triglycerides: 114 mg/dL (ref 0.0–149.0)
VLDL: 22.8 mg/dL (ref 0.0–40.0)

## 2024-03-18 LAB — TSH: TSH: 1.51 u[IU]/mL (ref 0.35–5.50)

## 2024-03-18 LAB — VITAMIN D 25 HYDROXY (VIT D DEFICIENCY, FRACTURES): VITD: 32.57 ng/mL (ref 30.00–100.00)

## 2024-03-18 MED ORDER — ATORVASTATIN CALCIUM 20 MG PO TABS: 20.0000 mg | ORAL_TABLET | Freq: Every evening | ORAL | 3 refills | Status: AC

## 2024-03-18 MED ORDER — TIZANIDINE HCL 4 MG PO TABS: 4.0000 mg | ORAL_TABLET | Freq: Four times a day (QID) | ORAL | 1 refills | Status: AC | PRN

## 2024-03-18 MED ORDER — DICLOFENAC SODIUM 75 MG PO TBEC: 75.0000 mg | DELAYED_RELEASE_TABLET | Freq: Two times a day (BID) | ORAL | 1 refills | Status: DC | PRN

## 2024-03-18 MED ORDER — HYDROCHLOROTHIAZIDE 12.5 MG PO CAPS: 12.5000 mg | ORAL_CAPSULE | Freq: Every day | ORAL | 3 refills | Status: AC

## 2024-03-18 NOTE — Progress Notes (Signed)
 [facility] [address] [place] [address] Dept: [phone] Dept Fax: [phone]    Subjective:   Jane [name] [birth year] 03/18/2024  Chief Complaint  Patient presents with   Follow-up    Discuss medications     HPI: Jane [name] presents today for re-assessment and management of chronic medical conditions.  Discussed the use of AI scribe software for clinical note transcription with the patient, who gave verbal consent to proceed.  History of Present Illness   Jane [name] is a 68 year old [name] who presents for a follow-up visit.  She has been taking atorvastatin  20 mg more consistently and requests a new prescription. She previously experienced side effects with Crestor and Lipitor (higher dose), such as feeling sick and arm weakness, respectively, but currently tolerates atorvastatin  well.  Her blood pressure is managed with lisinopril  and hydrochlorothiazide . She confirms having a refill for lisinopril  but needs a new prescription for hydrochlorothiazide .  She experiences back pain due to lumbar spine degeneration and uses tizanidine  and diclofenac  as needed for relief. These medications are effective, and she also performs exercises that help alleviate her symptoms.  She takes Ativan  as needed for anxiety and continues to manage her condition with this medication.No SI/HI.   She has a history of rosacea and uses metronidazole gel regularly. For more severe outbreaks, she uses clindamycin and benzoyl peroxide, although it can dry her skin excessively. She was initially prescribed these treatments by a dermatologist in [place].    The following portions of the patient's history were reviewed and updated as appropriate: past medical history, past surgical history, family history, social history, allergies, medications, and problem list.   Patient Active Problem List   Diagnosis Date Noted   Vasomotor  rhinitis 02/27/2024   Mixed hyperlipidemia 02/13/2023   Degeneration of lumbar intervertebral disc 01/13/2023   Lumbar radiculopathy 12/27/2022   DDD (degenerative disc disease), cervical 11/15/2022   Osteoarthritis of right knee 10/07/2022   Diverticulosis 06/26/2022   History of colon polyps 06/26/2022   Internal hemorrhoids 06/26/2022   Hiatal hernia 06/14/2022   Anxiety disorder 01/08/2018   Plantar fasciitis, bilateral 02/29/2016   Metatarsal deformity 02/29/2016   Tenosynovitis of foot 02/29/2016   Panic disorder (episodic paroxysmal anxiety) 09/07/2015   Vitamin D  deficiency 06/03/2014   Esophageal reflux 12/01/2013   Primary hypertension 12/01/2013   Past Medical History:  Diagnosis Date   Acid reflux    Anxiety disorder 01/08/2018   Arthritis    Hypertension    Reflux    Vasculitis (HCC)    Vitamin D  deficiency 06/03/2014   Past Surgical History:  Procedure Laterality Date   COLONOSCOPY     TUBAL LIGATION     Family History  Problem Relation Age of Onset   Alcoholism Mother    Lung disease Maternal Grandmother    Diabetes Maternal Uncle    Colon cancer Neg Hx    Stomach cancer Neg Hx     Current Outpatient Medications:    acetaminophen  (TYLENOL ) 500 MG tablet, Take 1,000 mg by mouth every 6 (six) hours as needed for mild pain., Disp: , Rfl:    albuterol  (VENTOLIN  HFA) 108 (90 Base) MCG/ACT inhaler, Inhale 2 puffs into the lungs every 4 (four) hours as needed for wheezing or shortness of breath., Disp: 8.5 each, Rfl: 0   aluminum-magnesium  hydroxide 200-200 MG/5ML suspension, Take 30 mLs by mouth., Disp: , Rfl:    B Complex Vitamins (VITAMIN-B COMPLEX) TABS, Take 1  tablet by mouth daily. , Disp: , Rfl:    Cholecalciferol 25 MCG (1000 UT) capsule, Take 1,000 Units by mouth daily. , Disp: , Rfl:    clindamycin-benzoyl peroxide (BENZACLIN) gel, Apply topically 2 (two) times daily., Disp: , Rfl:    diclofenac  (VOLTAREN ) 75 MG EC tablet, Take 1 tablet (75 mg  total) by mouth 2 (two) times daily as needed for moderate pain (pain score 4-6)., Disp: 30 tablet, Rfl: 1   esomeprazole  (NEXIUM ) 20 MG capsule, Take 1 capsule (20 mg total) by mouth 2 (two) times daily before a meal., Disp: 180 capsule, Rfl: 1   lisinopril  (ZESTRIL ) 40 MG tablet, Take 1 tablet (40 mg total) by mouth daily. daily., Disp: 90 tablet, Rfl: 1   LORazepam  (ATIVAN ) 0.5 MG tablet, Take 1 tablet (0.5 mg total) by mouth daily as needed for anxiety., Disp: 30 tablet, Rfl: 0   MAGNESIUM -CALCIUM -FOLIC ACID PO, Take 1 tablet by mouth daily., Disp: , Rfl:    metroNIDAZOLE (METROGEL) 0.75 % gel, Apply 1 Application topically 2 (two) times daily., Disp: , Rfl:    polyethylene glycol (MIRALAX ) 17 g packet, Take 17 g by mouth daily., Disp: 14 each, Rfl: 0   triamcinolone  cream (KENALOG ) 0.1 %, Apply 1 Application topically 2 (two) times daily., Disp: 45 g, Rfl: 1   atorvastatin  (LIPITOR) 20 MG tablet, Take 1 tablet (20 mg total) by mouth every evening., Disp: 90 tablet, Rfl: 3   hydrochlorothiazide  (MICROZIDE ) 12.5 MG capsule, Take 1 capsule (12.5 mg total) by mouth daily., Disp: 90 capsule, Rfl: 3   tiZANidine  (ZANAFLEX ) 4 MG tablet, Take 1 tablet (4 mg total) by mouth every 6 (six) hours as needed (neck pain and back pain)., Disp: 30 tablet, Rfl: 1 Allergies  Allergen Reactions   Iodinated Contrast Media Itching    Pt developed facial itching after contrast administration.   Shellfish Allergy Hives and Itching     ROS: A complete ROS was performed with pertinent positives/negatives noted in the HPI. The remainder of the ROS are negative.    Objective:   Today's Vitals   03/18/24 0826  BP: 134/82  Pulse: 65  Temp: 98 F (36.7 C)  TempSrc: Temporal  SpO2: 99%  Weight: 216 lb 3.2 oz (98.1 kg)  Height: 5' 4 (1.626 m)    GENERAL: Well-appearing, in NAD. Well nourished.  SKIN: Pink, warm and dry. No rash, lesion, ulceration, or ecchymoses.  NECK: Trachea midline. Full ROM w/o pain  or tenderness. No lymphadenopathy.  RESPIRATORY: Chest wall symmetrical. Respirations even and non-labored. Breath sounds clear to auscultation bilaterally.  CARDIAC: S1, S2 present, regular rate and rhythm. Peripheral pulses 2+ bilaterally.  EXTREMITIES: Without clubbing, cyanosis, or edema.  NEUROLOGIC: No motor or sensory deficits. Steady, even gait.  PSYCH/MENTAL STATUS: Alert, oriented x 3. Cooperative, appropriate mood and affect.   Health Maintenance Due  Topic Date Due   Medicare Annual Wellness (AWV)  Never done    No results found for any visits on 03/18/24.  The 10-year ASCVD risk score (Arnett DK, et al., 2019) is: 14.3%     Assessment & Plan:  Assessment and Plan    Essential hypertension Blood pressure well-controlled with current medication. - Continue lisinopril . - Prescribe hydrochlorothiazide . - Order BMP to monitor kidney function.  Hyperlipidemia Atorvastatin  20 mg currently well-tolerated and effective. - Prescribe atorvastatin . - Order blood work to monitor cholesterol levels.  Generalized anxiety disorder Anxiety managed with Ativan  as needed. - Continue Ativan  as needed.  Degenerative disease  of lumbar spine Chronic back pain managed with tizanidine , diclofenac , and exercises. - Prescribe tizanidine  as needed. - Prescribe diclofenac  twice daily as needed.  Rosacea Rosacea managed with metronidazole gel. Clindamycin and benzoyl peroxide used for severe outbreaks. - Continue metronidazole gel. - Use clindamycin and benzoyl peroxide for severe outbreaks.  Vitamin D  Deficiency  - Check Vitamin D  level  - continue supplement.       Orders Placed This Encounter  Procedures   Basic Metabolic Panel (BMET)   TSH   Lipid panel   VITAMIN D  25 Hydroxy (Vit-D Deficiency, Fractures)   No images are attached to the encounter or orders placed in the encounter. Meds ordered this encounter  Medications   atorvastatin  (LIPITOR) 20 MG tablet    Sig:  Take 1 tablet (20 mg total) by mouth every evening.    Dispense:  90 tablet    Refill:  3    Supervising Provider:   THOMPSON, AARON B [[id number]]   hydrochlorothiazide  (MICROZIDE ) 12.5 MG capsule    Sig: Take 1 capsule (12.5 mg total) by mouth daily.    Dispense:  90 capsule    Refill:  3    Supervising Provider:   THOMPSON, AARON B [[id number]]   tiZANidine  (ZANAFLEX ) 4 MG tablet    Sig: Take 1 tablet (4 mg total) by mouth every 6 (six) hours as needed (neck pain and back pain).    Dispense:  30 tablet    Refill:  1    Supervising Provider:   THOMPSON, AARON B [[id number]]   diclofenac  (VOLTAREN ) 75 MG EC tablet    Sig: Take 1 tablet (75 mg total) by mouth 2 (two) times daily as needed for moderate pain (pain score 4-6).    Dispense:  30 tablet    Refill:  1    Supervising Provider:   THOMPSON, AARON B [[id number]]    Return in about 6 months (around 09/18/2024) for Chronic Condition follow up.   Rosina Senters, FNP

## 2024-03-23 ENCOUNTER — Ambulatory Visit: Payer: Self-pay | Admitting: Internal Medicine

## 2024-04-28 ENCOUNTER — Other Ambulatory Visit: Payer: Self-pay | Admitting: Internal Medicine

## 2024-05-08 ENCOUNTER — Ambulatory Visit

## 2024-07-29 ENCOUNTER — Other Ambulatory Visit: Payer: Self-pay | Admitting: Internal Medicine

## 2024-07-29 DIAGNOSIS — M5136 Other intervertebral disc degeneration, lumbar region with discogenic back pain only: Secondary | ICD-10-CM

## 2024-07-29 NOTE — Telephone Encounter (Signed)
 Pt requesting refill for diclofenac  (VOLTAREN ) 75 MG EC tablet   LOV 03/18/24 FOV  not scheduled LRF 03/18/24

## 2024-07-31 ENCOUNTER — Ambulatory Visit: Payer: Self-pay

## 2024-07-31 NOTE — Telephone Encounter (Signed)
 Call disconnected prior to warm transfer. Nurse will attempt to contact patient.

## 2024-07-31 NOTE — Telephone Encounter (Signed)
 This RN attempted to contact patient, no answer, unable to leave voicemail message; voicemail not set up.  Will place in Call Back folder.

## 2024-07-31 NOTE — Telephone Encounter (Signed)
 FYI Only or Action Required?: FYI only for provider: appointment scheduled on 12/15.  Patient was last seen in primary care on 03/18/2024 by Billy Knee, FNP.  Called Nurse Triage reporting Hypertension.  Symptoms began yesterday.  Interventions attempted: Prescription medications: Lisinopril , hydrochlorothiazide .  Symptoms are: unchanged.  Triage Disposition: See PCP When Office is Open (Within 3 Days)  Patient/caregiver understands and will follow disposition?: Yes       Copied from CRM #[id number]. Topic: Clinical - Red Word Triage >> Jul 31, 2024 10:25 AM Victoria A wrote: [facility] that prompted transfer to Nurse Triage: Patient calling back to speak to NT-could not hold earlier Reason for Disposition  Systolic BP >= 160 OR Diastolic >= 100  Answer Assessment - Initial Assessment Questions 1. BLOOD PRESSURE: What is your blood pressure? Did you take at least two measurements 5 minutes apart?     143/98  2. ONSET: When did you take your blood pressure?     This morning   3. HOW: How did you take your blood pressure? (e.g., automatic home BP monitor, visiting nurse)     automatic home BP monitor  4. HISTORY: Do you have a history of high blood pressure?     HTN  5. MEDICINES: Are you taking any medicines for blood pressure? Have you missed any doses recently?     No missed doses of Lisinopril , hydrochlorothiazide   6. OTHER SYMPTOMS: Do you have any symptoms? (e.g., blurred vision, chest pain, difficulty breathing, headache, weakness)    No.    Patient is calling to report elevated B/ ongoing for a few days. She was seen in UC yesterday for symptoms and slurred speech (now resolved). No significant findings found.  She is taking the Lisinopril , hydrochlorothiazide  as prescribed. She stated her B/P this morning was 143/98. She  would like to follow up with PCP. Appointment scheduled for evaluation. Patient agrees with plan of care, and will call back if  anything changes, or if symptoms worsen.  Protocols used: Blood Pressure - High-A-AH

## 2024-07-31 NOTE — Telephone Encounter (Signed)
 Called and spoke to patient. She is scheduled for 08/03/24 with Corean Geralds.  She has been advised that if she feels worse over the weekend to go to the ER to be checked out.   Patient agreeable to plan. Dm/cma

## 2024-07-31 NOTE — Telephone Encounter (Signed)
 Copied from CRM #[id number]. Topic: Clinical - Red Word Triage >> Jul 31, 2024  9:45 AM Charolett L wrote: [facility] that prompted transfer to Nurse Triage: 143/98 now but yesterday sluggish 174/124 and slurred speech

## 2024-08-03 ENCOUNTER — Ambulatory Visit: Admitting: Emergency Medicine

## 2024-08-03 ENCOUNTER — Encounter: Payer: Self-pay | Admitting: Emergency Medicine

## 2024-08-03 VITALS — BP 160/88 | HR 61 | Temp 98.6°F | Resp 16 | Ht 64.0 in | Wt 214.8 lb

## 2024-08-03 DIAGNOSIS — R011 Cardiac murmur, unspecified: Secondary | ICD-10-CM | POA: Diagnosis not present

## 2024-08-03 DIAGNOSIS — I10 Essential (primary) hypertension: Secondary | ICD-10-CM

## 2024-08-03 DIAGNOSIS — F411 Generalized anxiety disorder: Secondary | ICD-10-CM | POA: Diagnosis not present

## 2024-08-03 DIAGNOSIS — G479 Sleep disorder, unspecified: Secondary | ICD-10-CM

## 2024-08-03 NOTE — Assessment & Plan Note (Signed)
 Generalized anxiety disorder Anxiety episodes contributing to elevated blood pressure. Lorazepam  prescribed for acute episodes, not for daily use due to addiction risk. - Continue lorazepam  as needed for acute anxiety episodes.

## 2024-08-03 NOTE — Progress Notes (Signed)
 Assessment & Plan:   Assessment & Plan Essential hypertension Episodes of elevated blood pressure with single episode subjective speech difficulty but but no observable symptoms.  On further discussion, this has to happen once about 7 years ago as well.  Likely related to elevated blood pressure.  I did encourage her to call the imaging facility to inquire about having her test completed but do not feel there is an indication to order otherwise emergent brain imaging in addition to this.  She was advised that should any new or recurring symptoms develop that she should present to the emergency department and she voiced understanding of this recommendation.   - EKG NSR rate 61BPM poor r-wave progression as was seen in July, no acute ischemic findings - Ensure consistent daily intake of blood pressure medication. - Monitor salt intake. - Keep a blood pressure journal. - Follow up with Rosina in two weeks. - Complete CT scan as ordered by urgent care.    Systolic murmur Mild systolic murmur detected, possibly related to valve function. May suggest structural heart issues contributing to blood pressure elevation. - Ordered echocardiogram to assess heart structure and function. - Ordered EKG to evaluate cardiac electrical activity. Orders:   EKG 12-Lead   ECHOCARDIOGRAM COMPLETE; Future  Trouble in sleeping  Sleep disorder Difficulty sleeping, possibly exacerbated by anxiety and inconsistent sleep hygiene. - Recommended melatonin 0.25 mg (1 mg tablet, quartered) taken 1-1.5 hours before bedtime. - Advised against screen use one hour before bedtime. - Encouraged sleep hygiene practices.    Generalized anxiety disorder Generalized anxiety disorder Anxiety episodes contributing to elevated blood pressure. Lorazepam  prescribed for acute episodes, not for daily use due to addiction risk. - Continue lorazepam  as needed for acute anxiety episodes.       Corean Geralds, MSPAS,  PA-C    Subjective:   Chief Complaint  Patient presents with   Hypertension    Episodes of elevated BP lately. No symptoms as of this morning. CT scan was ordered by a different provider- has not been done. BP at home this AM 143/83 and 155/106.    Anxiety    Pt feels increased anxiety lately. Having a hard time sleeping. PHQ score 10. GAD score 17.      HPI:  Discussed the use of AI scribe software for clinical note transcription with the patient, who gave verbal consent to proceed.  History of Present Illness Jane [name] is a 68 year old female with hypertension who presents for follow-up after recent urgent care visit.  With episodes of speech difficulty and confusion.  She had an episode 6 days ago when her sister came in the room to speak to her where her entire body felt heavy and weak and she felt as though she had to work harder to get her speech out.  This was previously described as her speech being slurred but she now reports there did not appear to be any true slurring and her sister who observe the episode noticed no objective changes in her speech pattern.  There was no localized/facial or extremity weakness or numbness.  She denied that there was any associated severe headache, vision loss, confusion, balance difficulty.  Occasional mild frontal head pressure.  During the episode, her blood pressure was quite high 190s systolic but she reports inconsistently taking her blood pressure medication.  She had an unremarkable physical examination aside from elevated blood pressure at urgent care the following day and a CT scan was ordered of  the brain but she has not yet completed this test.  She has not had any further episodes since that visit.  She also mentions a recent increase in anxiety and occasional depression.  She does have a history of anxiety and was reevaluated in urgent care yesterday where her lorazepam  was refilled for 30 tablets.  She has no interest in taking a  daily medication despite multiple previous attempts by her care team to start them.  She denies any chest pain-exertional or otherwise, shortness of breath-exertional or while lying in bed, lower extremity swelling, recent change in appetite or thirst, change in urination habits.  She does report she has gained weight that she feels is related to having a sweet tooth.  Unremarkable metabolic panel about 4 months ago and last A1c in November 2024 was 5.6 with no elevated readings found in our system.      ROS: Negative unless specifically indicated above in HPI.   Relevant past medical history reviewed and updated as indicated.   Allergies and medications reviewed and updated.  Current Medications[1]  Allergies[2]  Social History[3]   Objective:   Vitals:   08/03/24 0806 08/03/24 0853  BP: (!) 162/92 (!) 160/88  Pulse: 61   Temp: 98.6 F (37 C)   Resp: 16   Height: 5' 4 (1.626 m)   Weight: 214 lb 12.8 oz (97.4 kg)   SpO2: 98%   BMI (Calculated): 36.85      Appears well, pleasant, in no acute distress Sclera anicteric.  Pupils are equal, round, reactive to light with extraocular movements intact and visual fields full to confrontation at 3 feet Oral mucosa moist.  Tongue is midline. Heart regular rate and rhythm.  2 out of 6 systolic murmur. Normal resp effort and excursion.  Lungs are clear to auscultation bilaterally No pretibial edema.  Calves are supple and nontender. Alert and oriented x 3.  Cranial nerves III through XII are intact.  Finger-nose, heel-to-shin, rapid alternating movements intact bilaterally.  Normal gait without truncal or limb ataxia.     [1]  Current Outpatient Medications:    acetaminophen  (TYLENOL ) 500 MG tablet, Take 1,000 mg by mouth every 6 (six) hours as needed for mild pain., Disp: , Rfl:    albuterol  (VENTOLIN  HFA) 108 (90 Base) MCG/ACT inhaler, Inhale 2 puffs into the lungs every 4 (four) hours as needed for wheezing or shortness of  breath., Disp: 8.5 each, Rfl: 0   atorvastatin  (LIPITOR) 20 MG tablet, Take 1 tablet (20 mg total) by mouth every evening. (Patient taking differently: Take 40 mg by mouth every evening.), Disp: 90 tablet, Rfl: 3   B Complex Vitamins (VITAMIN-B COMPLEX) TABS, Take 1 tablet by mouth daily. , Disp: , Rfl:    Cholecalciferol 25 MCG (1000 UT) capsule, Take 1,000 Units by mouth daily. , Disp: , Rfl:    clindamycin-benzoyl peroxide (BENZACLIN) gel, Apply topically 2 (two) times daily., Disp: , Rfl:    diclofenac  (VOLTAREN ) 75 MG EC tablet, TAKE 1 TABLET(75 MG) BY MOUTH TWICE DAILY AS NEEDED FOR MODERATE PAIN, Disp: 30 tablet, Rfl: 1   esomeprazole  (NEXIUM ) 20 MG capsule, Take 1 capsule (20 mg total) by mouth 2 (two) times daily before a meal., Disp: 180 capsule, Rfl: 1   hydrochlorothiazide  (MICROZIDE ) 12.5 MG capsule, Take 1 capsule (12.5 mg total) by mouth daily., Disp: 90 capsule, Rfl: 3   lisinopril  (ZESTRIL ) 40 MG tablet, TAKE 1 TABLET(40 MG) BY MOUTH DAILY, Disp: 90 tablet, Rfl: 1  LORazepam  (ATIVAN ) 0.5 MG tablet, Take 1 tablet (0.5 mg total) by mouth daily as needed for anxiety., Disp: 30 tablet, Rfl: 0   MAGNESIUM -CALCIUM -FOLIC ACID PO, Take 1 tablet by mouth daily., Disp: , Rfl:    polyethylene glycol (MIRALAX ) 17 g packet, Take 17 g by mouth daily., Disp: 14 each, Rfl: 0   tiZANidine  (ZANAFLEX ) 4 MG tablet, Take 1 tablet (4 mg total) by mouth every 6 (six) hours as needed (neck pain and back pain)., Disp: 30 tablet, Rfl: 1   triamcinolone  cream (KENALOG ) 0.1 %, Apply 1 Application topically 2 (two) times daily., Disp: 45 g, Rfl: 1   aluminum-magnesium  hydroxide 200-200 MG/5ML suspension, Take 30 mLs by mouth. (Patient not taking: Reported on 08/03/2024), Disp: , Rfl:    metroNIDAZOLE (METROGEL) 0.75 % gel, Apply 1 Application topically 2 (two) times daily. (Patient not taking: Reported on 08/03/2024), Disp: , Rfl:  [2]  Allergies Allergen Reactions   Iodinated Contrast Media Itching    Pt  developed facial itching after contrast administration.   Iodine Hives    Patient had multiple hives after getting contrast for CT scan. Treated with 25 mg oral Benadryl .   Shellfish Allergy Hives and Itching  [3]  Social History Tobacco Use   Smoking status: Never   Smokeless tobacco: Never  Vaping Use   Vaping status: Never Used  Substance Use Topics   Alcohol  use: No    Alcohol /week: 0.0 standard drinks of alcohol    Drug use: No

## 2024-08-03 NOTE — Patient Instructions (Addendum)
°  VISIT SUMMARY: Today, we discussed your recent episodes of speech difficulty and confusion, likely related to high blood pressure and anxiety. We also addressed your hypertension, a newly detected heart murmur, anxiety, and sleep issues.  YOUR PLAN: ESSENTIAL HYPERTENSION: Episodes of elevated blood pressure with speech difficulties and confusion, likely related to anxiety and inconsistent medication adherence. -Ensure consistent daily intake of blood pressure medication. -Monitor your salt intake. -Keep a blood pressure journal. -Follow up with Rosina in two weeks. -Complete the CT scan as ordered by urgent care.  SYSTOLIC CARDIAC MURMUR: Mild systolic murmur detected, possibly related to valve function, which may contribute to elevated blood pressure. -An echocardiogram has been ordered to assess your heart structure and function. -An EKG has been ordered to evaluate your heart's electrical activity.  GENERALIZED ANXIETY DISORDER: Anxiety episodes contributing to elevated blood pressure. -Continue taking lorazepam  as needed for acute anxiety episodes.  SLEEP DISORDER: Difficulty sleeping, possibly exacerbated by anxiety and inconsistent sleep hygiene. -Take melatonin 0.25 mg (1 mg tablet, quartered) 1-1.5 hours before bedtime.You can increase to 0.5mg  if this does not help.  -Avoid screen use one hour before bedtime. -Practice good sleep hygiene.

## 2024-08-04 ENCOUNTER — Ambulatory Visit: Payer: Self-pay

## 2024-08-04 NOTE — Telephone Encounter (Signed)
 FYI Only or Action Required?: Action required by provider: update on patient condition and Wants CT ordered without contrast.  Patient was last seen in primary care on 08/03/2024 by Waddell Krabbe, PA-C.  Called Nurse Triage reporting Anxiety.  Symptoms began several months ago.  Interventions attempted: Prescription medications: lorazepam .  Symptoms are: unchanged.  Triage Disposition: Home Care  Patient/caregiver understands and will follow disposition?: Yes  Copied from CRM #[id number]. Topic: Clinical - Red Word Triage >> Aug 04, 2024  4:38 PM Drema MATSU wrote: Red Word that prompted transfer to Nurse Triage: Patient states that she has been very nervous/ anxiety if she is up doing alot and her blood pressure has been going high 144/93. Reason for Disposition  MILD anxiety symptoms (e.g., anxiety symptoms are mild and intermittent; symptoms do not interfere with daily activities)  Answer Assessment - Initial Assessment Questions Patient states she talked with Krabbe Waddell about getting a CT and now wants that ordered, without contrast due to prior reaction.Says they discussed that anxiety might be caused by prior TIA. Says her anxiety is worse than normal and is having a hard time controlling.  Patient calling concerned about her blood pressure. States she feels very nervous and anxious. Patient says she is taking blood pressure about every 30 minutes, or at least 1 time an hour. I told patient she does not need to check it that much and that anxiety can increase her blood pressure. States she takes it before taking blood pressure meds and it is always high. Explained she doesn't need to take before and that she can check an hour or two after or if she is having symptoms. Patient denies symptoms other than anxiety. Spent time talking with patient about things she can do to lower anxiety. After about 20 minutes patient states she is feeling better and more calm. Talking more slowly and  at a normal rate now. Patient says she will try some of the relaxation techniques I talked with her about. 1. CONCERN: Did anything happen that prompted you to call today?      Patient crying and states she has been crying a lot. 2. ANXIETY SYMPTOMS: Can you describe how you (your loved one; patient) have been feeling? (e.g., tense, restless, panicky, anxious, keyed up, overwhelmed, sense of impending doom).      Anxious and panicky. 5. FUNCTIONAL IMPAIRMENT: How have these feelings affected your ability to do daily activities? Have you had more difficulty than usual doing your normal daily activities? (e.g., getting better, same, worse; self-care, school, work, interactions)     I think I do alright getting things done 7. RISK OF HARM - SUICIDAL IDEATION: Do you ever have thoughts of hurting or killing yourself? If Yes, ask:  Do you have these feelings now? Do you have a plan on how you would do this?     Denies 8. TREATMENT:  What has been done so far to treat this anxiety? (e.g., medicines, relaxation strategies). What has helped?     Lorazepam  9. THERAPIST: Do you have a counselor or therapist? If Yes, ask: What is their name?     Denies 10. POTENTIAL TRIGGERS: Do you drink caffeinated beverages (e.g., coffee, colas, teas), and how much daily? Do you drink alcohol  or use any drugs? Have you started any new medicines recently?       Denies 11. PATIENT SUPPORT: Who is with you now? Who do you live with? Do you have family or friends who you can talk  to?        Patient says she has her sister and she saw her today. Anxiety started after that. Patient took her lorazepam  and says it feels like  Protocols used: Anxiety and Panic Attack-A-AH

## 2024-08-05 ENCOUNTER — Telehealth: Payer: Self-pay

## 2024-08-05 NOTE — Telephone Encounter (Signed)
 Per Clinton PA notes, CT has already been ordered by urgent care.

## 2024-08-05 NOTE — Addendum Note (Signed)
 Addended by: WADDELL COREAN PARAS on: 08/05/2024 10:28 AM   Modules accepted: Orders

## 2024-08-05 NOTE — Telephone Encounter (Signed)
 Copied from CRM #[id number]. Topic: Clinical - Request for Lab/Test Order >> Aug 04, 2024  4:39 PM [name] wrote: Reason for CRM: Patient stated that months ago she had a CAT Scan done and it had iodine in it. She wants to know if [name] can set her up a CAT scan at [name] without iodine because she is allergic to it.

## 2024-08-05 NOTE — Telephone Encounter (Signed)
 Please call pt: The CT ordered by urgent care might be able to be done quicker than I can get one- I will order one on my end but I also suggest she try calling them to see if they will change their original order to be a CT scan without contrast and then see if the [facility] office where she went could get it done this week. As we discussed- if symptoms returned before the CT scan can be done (feeling like there is difficulty in word finding, etc) she needs to go to the ER.  Re: the blood pressure and anxiety. Agree with nurse triage that she should not be checking it as much as she is- this gets her in a cycle of worry that is hard to break. While the reading she mentioned to them is elevated, it is not in a danger range and does not require rapid lowering or urgent evaluation. Plan to check BP only twice a day, keep a log, and bring to visit with Rosina  Finally, regarding anxiety- I honestly think speaking with a counselor would be a great option. Patient really does not want to take daily medications, so we don't have other good choices. I have placed a referral in our system, but in the meantime please provide some of the following resources for patient to call to try to get in with someone if that won't work or will take too long.  [facility]   [phone]   [facility]    [phone] [address], [address]   [place], [place] Medication management, substance abuse, bipolar, grief, family, marriage, OCD, anxiety, PTSD Sees children / Accepts Medicaid  [facility]    [phone] [address], [address] [place], [place] Sees children / Accepts Medicaid  [facility]   [phone] [address] [place], [place] Uses animal therapy  Sees children as young as 26 years old Accepts Medicaid

## 2024-08-05 NOTE — Telephone Encounter (Signed)
See other note documentation.

## 2024-08-07 ENCOUNTER — Telehealth: Payer: Self-pay

## 2024-08-07 NOTE — Telephone Encounter (Signed)
 Copied from CRM #[id number]. Topic: Clinical - Request for Lab/Test Order >> Aug 06, 2024  3:19 PM China J wrote: Reason for CRM: The patient was wondering if Rosina could put in an order to check her blood sugar levels. She is worried about being diabetic. She said that every time she eats sweets, she feels very tired and gets hit with a feeling of anxiety.

## 2024-08-07 NOTE — Telephone Encounter (Signed)
 We can check this at her appt in january

## 2024-08-10 NOTE — Telephone Encounter (Signed)
 Contacted patient and provided her with the information below. Patient states she will call the UC to get the CT order updated. Patient expressed understanding nothing further needed.

## 2024-08-14 NOTE — Telephone Encounter (Signed)
 Called pt to let them know that PCP will check blood sugars at next visit unable to leave message.

## 2024-08-31 ENCOUNTER — Encounter: Payer: Self-pay | Admitting: Internal Medicine

## 2024-08-31 ENCOUNTER — Ambulatory Visit (INDEPENDENT_AMBULATORY_CARE_PROVIDER_SITE_OTHER): Admitting: Internal Medicine

## 2024-08-31 VITALS — BP 132/80 | HR 63 | Temp 98.2°F | Ht 64.0 in | Wt 216.8 lb

## 2024-08-31 DIAGNOSIS — E782 Mixed hyperlipidemia: Secondary | ICD-10-CM | POA: Diagnosis not present

## 2024-08-31 DIAGNOSIS — Z23 Encounter for immunization: Secondary | ICD-10-CM

## 2024-08-31 DIAGNOSIS — R011 Cardiac murmur, unspecified: Secondary | ICD-10-CM | POA: Diagnosis not present

## 2024-08-31 DIAGNOSIS — I10 Essential (primary) hypertension: Secondary | ICD-10-CM | POA: Diagnosis not present

## 2024-08-31 DIAGNOSIS — F410 Panic disorder [episodic paroxysmal anxiety] without agoraphobia: Secondary | ICD-10-CM | POA: Diagnosis not present

## 2024-08-31 DIAGNOSIS — Z131 Encounter for screening for diabetes mellitus: Secondary | ICD-10-CM | POA: Diagnosis not present

## 2024-08-31 MED ORDER — METRONIDAZOLE 0.75 % EX GEL: 1.0000 | Freq: Two times a day (BID) | CUTANEOUS | 2 refills | Status: AC

## 2024-08-31 MED ORDER — LORAZEPAM 0.5 MG PO TABS: 0.5000 mg | ORAL_TABLET | Freq: Every day | ORAL | 0 refills | Status: AC | PRN

## 2024-08-31 NOTE — Progress Notes (Signed)
 " [facility] [address] [place] [address] Dept: [phone] Dept Fax: [phone]    Subjective:   Jane [name] 1956/02/27 08/31/2024  Chief Complaint  Patient presents with   Follow-up    Medication concerns, pt fasting     HPI: [name] presents today for re-assessment and management of chronic medical conditions.  Discussed the use of AI scribe software for clinical note transcription with the patient, who gave verbal consent to proceed.  History of Present Illness   Jane [name] is a 69 year old [name] with hypertension who presents for a follow-up visit for her high blood pressure.  She was last seen on August 03, 2024, for episodes of elevated blood pressure and a single episode of subjective speech difficulty. An EKG at that time showed normal sinus rhythm. She was instructed to monitor her blood pressure and complete a CT head scan. A systolic murmur was noted, and an echocardiogram was ordered.  She reports improved sleep, which helps reduce her anxiety. She experiences anxiety and nervousness when sleep-deprived.  She currently takes Ativan  only as needed, she does need refill.  She is concerned about diabetes and wants her cholesterol levels checked. She is on atorvastatin  40 mg and wants to assess improvement.  She is due for yearly influenza vaccine.  No chest pain, shortness of breath.  The following portions of the patient's history were reviewed and updated as appropriate: past medical history, past surgical history, family history, social history, allergies, medications, and problem list.   Patient Active Problem List   Diagnosis Date Noted   Vasomotor rhinitis 02/27/2024   Mixed hyperlipidemia 02/13/2023   Degeneration of lumbar intervertebral disc 01/13/2023   Lumbar radiculopathy 12/27/2022   DDD (degenerative disc disease), cervical 11/15/2022   Osteoarthritis of right knee 10/07/2022    Diverticulosis 06/26/2022   History of colon polyps 06/26/2022   Internal hemorrhoids 06/26/2022   Hiatal hernia 06/14/2022   Anxiety disorder 01/08/2018   Plantar fasciitis, bilateral 02/29/2016   Metatarsal deformity 02/29/2016   Tenosynovitis of foot 02/29/2016   Panic disorder (episodic paroxysmal anxiety) 09/07/2015   Vitamin D  deficiency 06/03/2014   Esophageal reflux 12/01/2013   Primary hypertension 12/01/2013   Past Medical History:  Diagnosis Date   Acid reflux    Anxiety disorder 01/08/2018   Arthritis    Hypertension    Reflux    Vasculitis    Vitamin D  deficiency 06/03/2014   Past Surgical History:  Procedure Laterality Date   COLONOSCOPY     TUBAL LIGATION     Family History  Problem Relation Age of Onset   Alcoholism Mother    Lung disease Maternal Grandmother    Diabetes Maternal Uncle    Colon cancer Neg Hx    Stomach cancer Neg Hx    Current Medications[1] Allergies[2]   ROS: A complete ROS was performed with pertinent positives/negatives noted in the HPI. The remainder of the ROS are negative.    Objective:   Today's Vitals   08/31/24 1506  BP: 132/80  Pulse: 63  Temp: 98.2 F (36.8 C)  TempSrc: Temporal  SpO2: 98%  Weight: 216 lb 12.8 oz (98.3 kg)  Height: 5' 4 (1.626 m)    GENERAL: Well-appearing, in NAD. Well nourished.  SKIN: Pink, warm and dry. No rash, lesion, ulceration, or ecchymoses.  NECK: Trachea midline. Full ROM w/o pain or tenderness. No lymphadenopathy.  RESPIRATORY: Chest wall symmetrical. Respirations even and non-labored. Breath sounds clear  to auscultation bilaterally.  CARDIAC: S1, S2 present, regular rate and rhythm. Peripheral pulses 2+ bilaterally.  2/6 systolic murmur MSK: Muscle tone and strength appropriate for age.  EXTREMITIES: Without clubbing, cyanosis, or edema.  NEUROLOGIC: No motor or sensory deficits. Steady, even gait.  PSYCH/MENTAL STATUS: Alert, oriented x 3. Cooperative, appropriate mood and  affect.   Health Maintenance Due  Topic Date Due   Medicare Annual Wellness (AWV)  Never done   Zoster Vaccines- Shingrix (1 of 2) Never done    No results found for any visits on 08/31/24.  The 10-year ASCVD risk score (Arnett DK, et al., 2019) is: 11.3%     Assessment & Plan:  Assessment and Plan    Essential hypertension Blood pressure controlled at 132/80 mmHg. EKG normal. Systolic murmur noted, echocardiogram ordered. - Continue current antihypertensive regimen. - Monitor blood pressure daily and maintain journal. - Monitor salt intake. - Check TSH, BMP  Systolic murmur - Proceed with echocardiogram.  Panic disorder Anxiety managed with lorazepam  as needed.  - Refilled lorazepam  prescription. - Continue lorazepam  as needed.  Mixed hyperlipidemia - Check lipid panel - Continue statin therapy  Screening for diabetes - Check A1c  General health maintenance Routine health maintenance discussed. Blood work ordered. Flu shot administered. Mammogram due in April, bone density scan due.  Shingles vaccine discussed. - Ordered blood work including A1c, cholesterol, and thyroid  function tests. - Administered flu shot. - Plan for mammogram in April. - Plan for bone density scan in April at time of mammogram - Discuss shingles vaccine at pharmacy.       Orders Placed This Encounter  Procedures   Flu vaccine HIGH DOSE PF(Fluzone Trivalent)   Lipid panel   Basic Metabolic Panel (BMET)   Hemoglobin A1C   TSH   No images are attached to the encounter or orders placed in the encounter. Meds ordered this encounter  Medications   metroNIDAZOLE  (METROGEL ) 0.75 % gel    Sig: Apply 1 Application topically 2 (two) times daily.    Dispense:  45 g    Refill:  2    Supervising Provider:   THOMPSON, AARON B [[id number]]   LORazepam  (ATIVAN ) 0.5 MG tablet    Sig: Take 1 tablet (0.5 mg total) by mouth daily as needed for anxiety.    Dispense:  30 tablet    Refill:  0     Supervising Provider:   THOMPSON, AARON B [[id number]]    Return in about 4 months (around 12/29/2024) for Chronic Condition follow up, mammo/dexa .   Rosina Senters, FNP     [1]  Current Outpatient Medications:    acetaminophen  (TYLENOL ) 500 MG tablet, Take 1,000 mg by mouth every 6 (six) hours as needed for mild pain., Disp: , Rfl:    albuterol  (VENTOLIN  HFA) 108 (90 Base) MCG/ACT inhaler, Inhale 2 puffs into the lungs every 4 (four) hours as needed for wheezing or shortness of breath., Disp: 8.5 each, Rfl: 0   atorvastatin  (LIPITOR) 20 MG tablet, Take 1 tablet (20 mg total) by mouth every evening. (Patient taking differently: Take 40 mg by mouth every evening.), Disp: 90 tablet, Rfl: 3   B Complex Vitamins (VITAMIN-B COMPLEX) TABS, Take 1 tablet by mouth daily. , Disp: , Rfl:    Cholecalciferol 25 MCG (1000 UT) capsule, Take 1,000 Units by mouth daily. , Disp: , Rfl:    diclofenac  (VOLTAREN ) 75 MG EC tablet, TAKE 1 TABLET(75 MG) BY MOUTH TWICE DAILY AS NEEDED FOR MODERATE PAIN,  Disp: 30 tablet, Rfl: 1   esomeprazole  (NEXIUM ) 20 MG capsule, Take 1 capsule (20 mg total) by mouth 2 (two) times daily before a meal., Disp: 180 capsule, Rfl: 1   hydrochlorothiazide  (MICROZIDE ) 12.5 MG capsule, Take 1 capsule (12.5 mg total) by mouth daily., Disp: 90 capsule, Rfl: 3   lisinopril  (ZESTRIL ) 40 MG tablet, TAKE 1 TABLET(40 MG) BY MOUTH DAILY, Disp: 90 tablet, Rfl: 1   MAGNESIUM -CALCIUM -FOLIC ACID PO, Take 1 tablet by mouth daily., Disp: , Rfl:    tiZANidine  (ZANAFLEX ) 4 MG tablet, Take 1 tablet (4 mg total) by mouth every 6 (six) hours as needed (neck pain and back pain)., Disp: 30 tablet, Rfl: 1   LORazepam  (ATIVAN ) 0.5 MG tablet, Take 1 tablet (0.5 mg total) by mouth daily as needed for anxiety., Disp: 30 tablet, Rfl: 0   metroNIDAZOLE  (METROGEL ) 0.75 % gel, Apply 1 Application topically 2 (two) times daily., Disp: 45 g, Rfl: 2   triamcinolone  cream (KENALOG ) 0.1 %, Apply 1 Application topically 2 (two)  times daily. (Patient not taking: Reported on 08/31/2024), Disp: 45 g, Rfl: 1 [2]  Allergies Allergen Reactions   Iodinated Contrast Media Itching    Pt developed facial itching after contrast administration.   Iodine Hives    Patient had multiple hives after getting contrast for CT scan. Treated with 25 mg oral Benadryl .   Shellfish Allergy Hives and Itching   "

## 2024-09-01 LAB — LIPID PANEL
Cholesterol: 160 mg/dL (ref 28–200)
HDL: 59.3 mg/dL
LDL Cholesterol: 62 mg/dL (ref 10–99)
NonHDL: 100.89
Total CHOL/HDL Ratio: 3
Triglycerides: 196 mg/dL — ABNORMAL HIGH (ref 10.0–149.0)
VLDL: 39.2 mg/dL (ref 0.0–40.0)

## 2024-09-01 LAB — BASIC METABOLIC PANEL WITH GFR
BUN: 16 mg/dL (ref 6–23)
CO2: 30 meq/L (ref 19–32)
Calcium: 9.3 mg/dL (ref 8.4–10.5)
Chloride: 101 meq/L (ref 96–112)
Creatinine, Ser: 0.85 mg/dL (ref 0.40–1.20)
GFR: 69.2 mL/min
Glucose, Bld: 74 mg/dL (ref 70–99)
Potassium: 3.9 meq/L (ref 3.5–5.1)
Sodium: 140 meq/L (ref 135–145)

## 2024-09-01 LAB — TSH: TSH: 1.07 u[IU]/mL (ref 0.35–5.50)

## 2024-09-01 LAB — HEMOGLOBIN A1C: Hgb A1c MFr Bld: 5.7 % (ref 4.6–6.5)

## 2024-09-02 ENCOUNTER — Ambulatory Visit: Payer: Self-pay | Admitting: Internal Medicine

## 2024-09-02 NOTE — Telephone Encounter (Signed)
Patient has been notified directly; all questions, if any, were answered. Patient voiced understanding.   

## 2024-09-15 ENCOUNTER — Ambulatory Visit (HOSPITAL_COMMUNITY)

## 2024-10-16 ENCOUNTER — Ambulatory Visit (HOSPITAL_COMMUNITY)
